# Patient Record
Sex: Female | Born: 1945
Health system: Southern US, Community
[De-identification: ages and names within clinical notes are randomized; demographics above are authoritative.]

## PROBLEM LIST (undated history)

## (undated) DIAGNOSIS — J432 Centrilobular emphysema: Principal | ICD-10-CM

## (undated) DIAGNOSIS — M1990 Unspecified osteoarthritis, unspecified site: Secondary | ICD-10-CM

## (undated) DIAGNOSIS — Z720 Tobacco use: Secondary | ICD-10-CM

## (undated) DIAGNOSIS — E663 Overweight: Secondary | ICD-10-CM

## (undated) DIAGNOSIS — I700 Atherosclerosis of aorta: Secondary | ICD-10-CM

## (undated) DIAGNOSIS — Z87442 Personal history of urinary calculi: Secondary | ICD-10-CM

## (undated) DIAGNOSIS — I2510 Atherosclerotic heart disease of native coronary artery without angina pectoris: Secondary | ICD-10-CM

## (undated) DIAGNOSIS — E785 Hyperlipidemia, unspecified: Secondary | ICD-10-CM

## (undated) DIAGNOSIS — R1013 Epigastric pain: Secondary | ICD-10-CM

## (undated) DIAGNOSIS — E222 Syndrome of inappropriate secretion of antidiuretic hormone: Secondary | ICD-10-CM

## (undated) DIAGNOSIS — R932 Abnormal findings on diagnostic imaging of liver and biliary tract: Secondary | ICD-10-CM

## (undated) HISTORY — PX: TUBAL LIGATION: SHX77

## (undated) HISTORY — PX: INGUINAL HERNIA REPAIR: SHX194

## (undated) HISTORY — DX: Atherosclerosis of aorta: I70.0

## (undated) HISTORY — DX: Hyperlipidemia, unspecified: E78.5

## (undated) HISTORY — DX: Unspecified osteoarthritis, unspecified site: M19.90

## (undated) HISTORY — DX: Abnormal findings on diagnostic imaging of liver and biliary tract: R93.2

## (undated) HISTORY — DX: Centrilobular emphysema: J43.2

## (undated) HISTORY — DX: Tobacco use: Z72.0

## (undated) HISTORY — DX: Atherosclerotic heart disease of native coronary artery without angina pectoris: I25.10

## (undated) HISTORY — DX: Overweight: E66.3

## (undated) HISTORY — DX: Epigastric pain: R10.13

## (undated) HISTORY — PX: KNEE ARTHROSCOPY: SHX127

---

## 1997-11-28 ENCOUNTER — Ambulatory Visit (HOSPITAL_BASED_OUTPATIENT_CLINIC_OR_DEPARTMENT_OTHER): Admission: RE | Admit: 1997-11-28 | Discharge: 1997-11-28 | Payer: Self-pay | Admitting: *Deleted

## 1998-10-15 ENCOUNTER — Other Ambulatory Visit: Admission: RE | Admit: 1998-10-15 | Discharge: 1998-10-15 | Payer: Self-pay | Admitting: *Deleted

## 1999-12-09 ENCOUNTER — Other Ambulatory Visit: Admission: RE | Admit: 1999-12-09 | Discharge: 1999-12-09 | Payer: Self-pay | Admitting: *Deleted

## 2000-12-08 ENCOUNTER — Other Ambulatory Visit: Admission: RE | Admit: 2000-12-08 | Discharge: 2000-12-08 | Payer: Self-pay | Admitting: *Deleted

## 2002-01-04 ENCOUNTER — Other Ambulatory Visit: Admission: RE | Admit: 2002-01-04 | Discharge: 2002-01-04 | Payer: Self-pay | Admitting: *Deleted

## 2003-01-10 ENCOUNTER — Other Ambulatory Visit: Admission: RE | Admit: 2003-01-10 | Discharge: 2003-01-10 | Payer: Self-pay | Admitting: *Deleted

## 2004-04-02 ENCOUNTER — Other Ambulatory Visit: Admission: RE | Admit: 2004-04-02 | Discharge: 2004-04-02 | Payer: Self-pay | Admitting: *Deleted

## 2005-04-14 ENCOUNTER — Other Ambulatory Visit: Admission: RE | Admit: 2005-04-14 | Discharge: 2005-04-14 | Payer: Self-pay | Admitting: Obstetrics and Gynecology

## 2006-06-01 ENCOUNTER — Ambulatory Visit: Payer: Self-pay

## 2006-07-18 ENCOUNTER — Ambulatory Visit: Payer: Self-pay | Admitting: Orthopaedic Surgery

## 2006-07-28 ENCOUNTER — Ambulatory Visit: Payer: Self-pay | Admitting: Orthopaedic Surgery

## 2010-07-29 ENCOUNTER — Ambulatory Visit: Payer: Self-pay

## 2010-08-25 ENCOUNTER — Ambulatory Visit: Payer: Self-pay | Admitting: Unknown Physician Specialty

## 2014-03-21 ENCOUNTER — Telehealth: Payer: Self-pay

## 2014-03-21 NOTE — Telephone Encounter (Signed)
Spoke with pt and she confirmed that she does not see any MD within [facility] and is a current pt of Dr. Wynetta Emery with [facility] in [place], [place]

## 2014-04-22 ENCOUNTER — Ambulatory Visit: Payer: Self-pay | Admitting: Family Medicine

## 2014-05-29 DIAGNOSIS — Z1211 Encounter for screening for malignant neoplasm of colon: Secondary | ICD-10-CM | POA: Diagnosis not present

## 2014-05-29 DIAGNOSIS — F17210 Nicotine dependence, cigarettes, uncomplicated: Secondary | ICD-10-CM | POA: Diagnosis not present

## 2014-05-29 DIAGNOSIS — E785 Hyperlipidemia, unspecified: Secondary | ICD-10-CM | POA: Diagnosis not present

## 2014-05-29 DIAGNOSIS — R030 Elevated blood-pressure reading, without diagnosis of hypertension: Secondary | ICD-10-CM | POA: Diagnosis not present

## 2014-05-29 DIAGNOSIS — R0981 Nasal congestion: Secondary | ICD-10-CM | POA: Diagnosis not present

## 2014-07-22 ENCOUNTER — Ambulatory Visit: Payer: Self-pay | Admitting: Gastroenterology

## 2014-07-22 DIAGNOSIS — Z91040 Latex allergy status: Secondary | ICD-10-CM | POA: Diagnosis not present

## 2014-07-22 DIAGNOSIS — D125 Benign neoplasm of sigmoid colon: Secondary | ICD-10-CM | POA: Diagnosis not present

## 2014-07-22 DIAGNOSIS — M13842 Other specified arthritis, left hand: Secondary | ICD-10-CM | POA: Diagnosis not present

## 2014-07-22 DIAGNOSIS — F17200 Nicotine dependence, unspecified, uncomplicated: Secondary | ICD-10-CM | POA: Diagnosis not present

## 2014-07-22 DIAGNOSIS — D122 Benign neoplasm of ascending colon: Secondary | ICD-10-CM | POA: Diagnosis not present

## 2014-07-22 DIAGNOSIS — J309 Allergic rhinitis, unspecified: Secondary | ICD-10-CM | POA: Diagnosis not present

## 2014-07-22 DIAGNOSIS — Z9889 Other specified postprocedural states: Secondary | ICD-10-CM | POA: Diagnosis not present

## 2014-07-22 DIAGNOSIS — M13841 Other specified arthritis, right hand: Secondary | ICD-10-CM | POA: Diagnosis not present

## 2014-07-22 DIAGNOSIS — Z1211 Encounter for screening for malignant neoplasm of colon: Secondary | ICD-10-CM | POA: Diagnosis not present

## 2014-07-22 DIAGNOSIS — Z882 Allergy status to sulfonamides status: Secondary | ICD-10-CM | POA: Diagnosis not present

## 2014-08-05 DIAGNOSIS — R030 Elevated blood-pressure reading, without diagnosis of hypertension: Secondary | ICD-10-CM | POA: Diagnosis not present

## 2015-01-09 DIAGNOSIS — L0231 Cutaneous abscess of buttock: Secondary | ICD-10-CM | POA: Diagnosis not present

## 2015-02-13 DIAGNOSIS — L03011 Cellulitis of right finger: Secondary | ICD-10-CM | POA: Diagnosis not present

## 2015-03-03 DIAGNOSIS — M1711 Unilateral primary osteoarthritis, right knee: Secondary | ICD-10-CM | POA: Diagnosis not present

## 2015-03-03 DIAGNOSIS — M25561 Pain in right knee: Secondary | ICD-10-CM | POA: Diagnosis not present

## 2015-09-04 DIAGNOSIS — L255 Unspecified contact dermatitis due to plants, except food: Secondary | ICD-10-CM | POA: Diagnosis not present

## 2015-11-03 DIAGNOSIS — L255 Unspecified contact dermatitis due to plants, except food: Secondary | ICD-10-CM | POA: Diagnosis not present

## 2016-02-05 DIAGNOSIS — R1013 Epigastric pain: Secondary | ICD-10-CM | POA: Diagnosis not present

## 2016-03-17 ENCOUNTER — Ambulatory Visit (INDEPENDENT_AMBULATORY_CARE_PROVIDER_SITE_OTHER): Payer: Medicare Other | Admitting: Family Medicine

## 2016-03-17 ENCOUNTER — Encounter: Payer: Self-pay | Admitting: Family Medicine

## 2016-03-17 VITALS — BP 130/70 | HR 85 | Temp 98.2°F | Resp 14 | Ht 66.0 in | Wt 162.3 lb

## 2016-03-17 DIAGNOSIS — R9431 Abnormal electrocardiogram [ECG] [EKG]: Secondary | ICD-10-CM

## 2016-03-17 DIAGNOSIS — K219 Gastro-esophageal reflux disease without esophagitis: Secondary | ICD-10-CM | POA: Insufficient documentation

## 2016-03-17 DIAGNOSIS — Z1231 Encounter for screening mammogram for malignant neoplasm of breast: Secondary | ICD-10-CM

## 2016-03-17 DIAGNOSIS — E663 Overweight: Secondary | ICD-10-CM

## 2016-03-17 DIAGNOSIS — Z720 Tobacco use: Secondary | ICD-10-CM | POA: Diagnosis not present

## 2016-03-17 DIAGNOSIS — R1013 Epigastric pain: Secondary | ICD-10-CM

## 2016-03-17 DIAGNOSIS — Z1239 Encounter for other screening for malignant neoplasm of breast: Secondary | ICD-10-CM

## 2016-03-17 HISTORY — DX: Epigastric pain: R10.13

## 2016-03-17 HISTORY — DX: Abnormal electrocardiogram (ECG) (EKG): R94.31

## 2016-03-17 HISTORY — DX: Gastro-esophageal reflux disease without esophagitis: K21.9

## 2016-03-17 HISTORY — DX: Overweight: E66.3

## 2016-03-17 HISTORY — DX: Tobacco use: Z72.0

## 2016-03-17 LAB — COMPLETE METABOLIC PANEL WITH GFR
ALBUMIN: 3.8 g/dL (ref 3.6–5.1)
ALT: 11 U/L (ref 6–29)
AST: 20 U/L (ref 10–35)
Alkaline Phosphatase: 76 U/L (ref 33–130)
BUN: 14 mg/dL (ref 7–25)
CALCIUM: 9.2 mg/dL (ref 8.6–10.4)
CHLORIDE: 103 mmol/L (ref 98–110)
CO2: 27 mmol/L (ref 20–31)
CREATININE: 0.81 mg/dL (ref 0.50–0.99)
GFR, Est African American: 86 mL/min (ref >=60)
GFR, Est Non African American: 75 mL/min (ref >=60)
GLUCOSE: 85 mg/dL (ref 65–99)
Potassium: 4.9 mmol/L (ref 3.5–5.3)
SODIUM: 137 mmol/L (ref 135–146)
Total Bilirubin: 0.5 mg/dL (ref 0.2–1.2)
Total Protein: 6.7 g/dL (ref 6.1–8.1)

## 2016-03-17 LAB — CBC WITH DIFFERENTIAL/PLATELET
BASOS PCT: 0 %
Basophils Absolute: 0 cells/uL (ref 0–200)
Eosinophils Absolute: 96 cells/uL (ref 15–500)
Eosinophils Relative: 2 %
HEMATOCRIT: 39.4 % (ref 35.0–45.0)
HEMOGLOBIN: 12.7 g/dL (ref 11.7–15.5)
LYMPHS ABS: 2016 {cells}/uL (ref 850–3900)
Lymphocytes Relative: 42 %
MCH: 31.7 pg (ref 27.0–33.0)
MCHC: 32.2 g/dL (ref 32.0–36.0)
MCV: 98.3 fL (ref 80.0–100.0)
MONO ABS: 432 {cells}/uL (ref 200–950)
MPV: 8.8 fL (ref 7.5–12.5)
Monocytes Relative: 9 %
Neutro Abs: 2256 cells/uL (ref 1500–7800)
Neutrophils Relative %: 47 %
Platelets: 284 10*3/uL (ref 140–400)
RBC: 4.01 MIL/uL (ref 3.80–5.10)
RDW: 14.7 % (ref 11.0–15.0)
WBC: 4.8 10*3/uL (ref 3.8–10.8)

## 2016-03-17 LAB — LIPID PANEL
CHOLESTEROL: 277 mg/dL — AB (ref 125–200)
HDL: 36 mg/dL — ABNORMAL LOW (ref >=46)
LDL Cholesterol: 224 mg/dL — ABNORMAL HIGH (ref <130)
Total CHOL/HDL Ratio: 7.7 Ratio — ABNORMAL HIGH (ref <=5.0)
Triglycerides: 87 mg/dL (ref <150)
VLDL: 17 mg/dL (ref <30)

## 2016-03-17 NOTE — Assessment & Plan Note (Signed)
Screening mammogram ordered

## 2016-03-17 NOTE — Assessment & Plan Note (Signed)
Discussed risk of PPIs; will let her finish this Rx and then switch to H2 blocker; elevate HOB

## 2016-03-17 NOTE — Progress Notes (Signed)
BP 130/70   Pulse 85   Temp 98.2 F (36.8 C)   Resp 14   Ht 5\' 6"  (1.676 m)   Wt 162 lb 4.8 oz (73.6 kg)   SpO2 97%   BMI 26.20 kg/m    Subjective:    Patient ID: Julie Beck, female    DOB: [birth year], 70 y.o.   MRN: [id number]  HPI: Julie Beck is a 70 y.o. female  Chief Complaint  Patient presents with  . Gastroesophageal Reflux   Patient used to see Dr. Nadine Counts, who has left the practice Last visit before June 2016 She has been having acid reflux; she is controlled as long as she is on the medicine She was having "ungodly pain" right here, pointing to epigastrium; every time she ate, it got worse She got checked out at urgent care; on low dose medicine and symptoms resolved EKG done at [facility]; T wave inversion V1 through V4 No fam hx of heart disease She is weaning herself off of smoking; using nonflavored e-cigs She is on the 2nd month of her Rx of PPI  Depression screen PHQ 2/9 03/17/2016  Decreased Interest 0  Down, Depressed, Hopeless 0  PHQ - 2 Score 0   Relevant past medical, surgical, family and social history reviewed Past Medical History:  Diagnosis Date  . Arthritis   . Tobacco abuse 03/17/2016   Past Surgical History:  Procedure Laterality Date  . TUBAL LIGATION     Family History  Problem Relation Age of Onset  . Alzheimer's disease Mother   . Breast cancer Mother   . Cancer Father   . Breast cancer Paternal Aunt   MD note: mother, aunt, grandmother breast cancer  Social History  Substance Use Topics  . Smoking status: Current Some Day Smoker    Types: E-cigarettes  . Smokeless tobacco: Never Used  . Alcohol use Not on file   Interim medical history since last visit reviewed. Allergies and medications reviewed  Review of Systems Per HPI unless specifically indicated above     Objective:    BP 130/70   Pulse 85   Temp 98.2 F (36.8 C)   Resp 14   Ht 5\' 6"  (1.676 m)   Wt 162 lb 4.8 oz (73.6 kg)   SpO2 97%   BMI 26.20  kg/m   Wt Readings from Last 3 Encounters:  03/17/16 162 lb 4.8 oz (73.6 kg)    Physical Exam  Constitutional: She appears well-developed and well-nourished. No distress.  HENT:  Head: Normocephalic and atraumatic.  Eyes: EOM are normal. No scleral icterus.  Neck: No thyromegaly present.  Cardiovascular: Normal rate, regular rhythm and normal heart sounds.   No murmur heard. Pulmonary/Chest: Effort normal and breath sounds normal. No respiratory distress. She has no wheezes.  Abdominal: Soft. Bowel sounds are normal. She exhibits no distension and no mass. There is no tenderness. There is no guarding.  Musculoskeletal: Normal range of motion. She exhibits no edema.  Neurological: She is alert. She exhibits normal muscle tone.  Skin: Skin is warm and dry. She is not diaphoretic. No pallor.  Psychiatric: She has a normal mood and affect. Her behavior is normal. Judgment and thought content normal.   No results found for this or any previous visit.    Assessment & Plan:   Problem List Items Addressed This Visit      Digestive   GERD (gastroesophageal reflux disease)    Discussed risk of PPIs; will let  her finish this Rx and then switch to H2 blocker; elevate HOB      Relevant Medications   omeprazole (PRILOSEC) 20 MG capsule     Other   Tobacco abuse    Encouraged her to quit completely      Epigastric abdominal pain - Primary    Check CBC, CMP      Relevant Orders   CBC with Differential/Platelet   COMPLETE METABOLIC PANEL WITH GFR   Breast cancer screening    Screening mammogram ordered      Relevant Orders   MM DIGITAL SCREENING BILATERAL   Abnormal EKG    Refer to cardiologist; smoking hx      Relevant Orders   Ambulatory referral to Cardiology   CBC with Differential/Platelet   Lipid panel    Other Visit Diagnoses   None.     Follow up plan: Return in about 4 weeks (around 04/14/2016) for Medicare wellness AND physical if covered; if both, then 40  minute slot.  An after-visit summary was printed and given to the patient at [place].  Please see the patient instructions which may contain other information and recommendations beyond what is mentioned above in the assessment and plan.  Meds ordered this encounter  Medications  . triamcinolone ointment (KENALOG) 0.1 %    Sig: Apply topically. PRN  . omeprazole (PRILOSEC) 20 MG capsule    Sig: TAKE 1 CAPSULE BY MOUTH DAILY  . calcium-vitamin D (OSCAL WITH D) 500-200 MG-UNIT tablet    Sig: Take 1 tablet by mouth every morning.    Orders Placed This Encounter  Procedures  . MM DIGITAL SCREENING BILATERAL  . CBC with Differential/Platelet  . COMPLETE METABOLIC PANEL WITH GFR  . Lipid panel  . Ambulatory referral to Cardiology

## 2016-03-17 NOTE — Assessment & Plan Note (Signed)
Refer to cardiologist; smoking hx

## 2016-03-17 NOTE — Assessment & Plan Note (Signed)
Encouraged her to quit completely 

## 2016-03-17 NOTE — Assessment & Plan Note (Signed)
Check CBC, CMP

## 2016-03-17 NOTE — Patient Instructions (Addendum)
When you get ready to run out omeprazole, call me and we'll get you a prescription for ranitidine 300 mg to take in the evening if needed for acid reflux  Caution: prolonged use of proton pump inhibitors like omeprazole (Prilosec), pantoprazole (Protonix), esomeprazole (Nexium), and others like Dexilant and Aciphex may increase your risk of pneumonia, Clostridium difficile colitis, osteoporosis, anemia and other health complications Try to limit or avoid triggers like coffee, caffeinated beverages, onions, chocolate, spicy foods, green and red peppers, peppermint, acid foods like pizza, spaghetti sauce, and orange juice Lose weight if you are overweight or obese Try elevating the head of your bed by placing a small wedge between your mattress and box springs to keep acid in the stomach at night instead of coming up into your esophagus  I do encourage you to quit smoking Call [phone] to sign up for smoking cessation classes You can call 1-800-QUIT-NOW to talk with a smoking cessation coach  Food Choices for Gastroesophageal Reflux Disease, Adult When you have gastroesophageal reflux disease (GERD), the foods you eat and your eating habits are very important. Choosing the right foods can help ease the discomfort of GERD. WHAT GENERAL GUIDELINES DO I NEED TO FOLLOW?  Choose fruits, vegetables, whole grains, low-fat dairy products, and low-fat meat, fish, and poultry.  Limit fats such as oils, salad dressings, butter, nuts, and avocado.  Keep a food diary to identify foods that cause symptoms.  Avoid foods that cause reflux. These may be different for different people.  Eat frequent small meals instead of three large meals each day.  Eat your meals slowly, in a relaxed setting.  Limit fried foods.  Cook foods using methods other than frying.  Avoid drinking alcohol.  Avoid drinking large amounts of liquids with your meals.  Avoid bending over or lying down until 2-3 hours after  eating. WHAT FOODS ARE NOT RECOMMENDED? The following are some foods and drinks that may worsen your symptoms: Vegetables Tomatoes. Tomato juice. Tomato and spaghetti sauce. Chili peppers. Onion and garlic. Horseradish. Fruits Oranges, grapefruit, and lemon (fruit and juice). Meats High-fat meats, fish, and poultry. This includes hot dogs, ribs, ham, sausage, salami, and bacon. Dairy Whole milk and chocolate milk. Sour cream. Cream. Butter. Ice cream. Cream cheese.  Beverages Coffee and tea, with or without caffeine. Carbonated beverages or energy drinks. Condiments Hot sauce. Barbecue sauce.  Sweets/Desserts Chocolate and cocoa. Donuts. Peppermint and spearmint. Fats and Oils High-fat foods, including [place] fries and potato chips. Other Vinegar. Strong spices, such as black pepper, white pepper, red pepper, cayenne, curry powder, cloves, ginger, and chili powder. The items listed above may not be a complete list of foods and beverages to avoid. Contact your dietitian for more information.   This information is not intended to replace advice given to you by your health care provider. Make sure you discuss any questions you have with your health care provider.   Document Released: 05/03/2005 Document Revised: 05/24/2014 Document Reviewed: 03/07/2013 Elsevier Interactive Patient Education 2016 [employer]. Smoking Cessation, Tips for Success If you are ready to quit smoking, congratulations! You have chosen to help yourself be healthier. Cigarettes bring nicotine, tar, carbon monoxide, and other irritants into your body. Your lungs, heart, and blood vessels will be able to work better without these poisons. There are many different ways to quit smoking. Nicotine gum, nicotine patches, a nicotine inhaler, or nicotine nasal spray can help with physical craving. Hypnosis, support groups, and medicines help break the habit of  smoking. WHAT THINGS CAN I DO TO MAKE QUITTING EASIER?  Here are  some tips to help you quit for good:  Pick a date when you will quit smoking completely. Tell all of your friends and family about your plan to quit on that date.  Do not try to slowly cut down on the number of cigarettes you are smoking. Pick a quit date and quit smoking completely starting on that day.  Throw away all cigarettes.   Clean and remove all ashtrays from your home, work, and car.  On a card, write down your reasons for quitting. Carry the card with you and read it when you get the urge to smoke.  Cleanse your body of nicotine. Drink enough water and fluids to keep your urine clear or pale yellow. Do this after quitting to flush the nicotine from your body.  Learn to predict your moods. Do not let a bad situation be your excuse to have a cigarette. Some situations in your life might tempt you into wanting a cigarette.  Never have "just one" cigarette. It leads to wanting another and another. Remind yourself of your decision to quit.  Change habits associated with smoking. If you smoked while driving or when feeling stressed, try other activities to replace smoking. Stand up when drinking your coffee. Brush your teeth after eating. Sit in a different chair when you read the paper. Avoid alcohol while trying to quit, and try to drink fewer caffeinated beverages. Alcohol and caffeine may urge you to smoke.  Avoid foods and drinks that can trigger a desire to smoke, such as sugary or spicy foods and alcohol.  Ask people who smoke not to smoke around you.  Have something planned to do right after eating or having a cup of coffee. For example, plan to take a walk or exercise.  Try a relaxation exercise to calm you down and decrease your stress. Remember, you may be tense and nervous for the first 2 weeks after you quit, but this will pass.  Find new activities to keep your hands busy. Play with a pen, coin, or rubber band. Doodle or draw things on paper.  Brush your teeth right  after eating. This will help cut down on the craving for the taste of tobacco after meals. You can also try mouthwash.   Use oral substitutes in place of cigarettes. Try using lemon drops, carrots, cinnamon sticks, or chewing gum. Keep them handy so they are available when you have the urge to smoke.  When you have the urge to smoke, try deep breathing.  Designate your home as a nonsmoking area.  If you are a heavy smoker, ask your health care provider about a prescription for nicotine chewing gum. It can ease your withdrawal from nicotine.  Reward yourself. Set aside the cigarette money you save and buy yourself something nice.  Look for support from others. Join a support group or smoking cessation program. Ask someone at home or at work to help you with your plan to quit smoking.  Always ask yourself, "Do I need this cigarette or is this just a reflex?" Tell yourself, "Today, I choose not to smoke," or "I do not want to smoke." You are reminding yourself of your decision to quit.  Do not replace cigarette smoking with electronic cigarettes (commonly called e-cigarettes). The safety of e-cigarettes is unknown, and some may contain harmful chemicals.  If you relapse, do not give up! Plan ahead and think about what you  will do the next time you get the urge to smoke. HOW WILL I FEEL WHEN I QUIT SMOKING? You may have symptoms of withdrawal because your body is used to nicotine (the addictive substance in cigarettes). You may crave cigarettes, be irritable, feel very hungry, cough often, get headaches, or have difficulty concentrating. The withdrawal symptoms are only temporary. They are strongest when you first quit but will go away within 10-14 days. When withdrawal symptoms occur, stay in control. Think about your reasons for quitting. Remind yourself that these are signs that your body is healing and getting used to being without cigarettes. Remember that withdrawal symptoms are easier to  treat than the major diseases that smoking can cause.  Even after the withdrawal is over, expect periodic urges to smoke. However, these cravings are generally short lived and will go away whether you smoke or not. Do not smoke! WHAT RESOURCES ARE AVAILABLE TO HELP ME QUIT SMOKING? Your health care provider can direct you to community resources or hospitals for support, which may include:  Group support.  Education.  Hypnosis.  Therapy.   This information is not intended to replace advice given to you by your health care provider. Make sure you discuss any questions you have with your health care provider.   Document Released: 01/30/2004 Document Revised: 05/24/2014 Document Reviewed: 10/19/2012 Elsevier Interactive Patient Education [employer].

## 2016-03-18 ENCOUNTER — Other Ambulatory Visit: Payer: Self-pay

## 2016-03-18 ENCOUNTER — Other Ambulatory Visit: Payer: Self-pay | Admitting: Family Medicine

## 2016-03-18 ENCOUNTER — Encounter: Payer: Self-pay | Admitting: Family Medicine

## 2016-03-18 DIAGNOSIS — Z5181 Encounter for therapeutic drug level monitoring: Secondary | ICD-10-CM | POA: Insufficient documentation

## 2016-03-18 DIAGNOSIS — E7849 Other hyperlipidemia: Secondary | ICD-10-CM | POA: Insufficient documentation

## 2016-03-18 DIAGNOSIS — E785 Hyperlipidemia, unspecified: Secondary | ICD-10-CM | POA: Insufficient documentation

## 2016-03-18 MED ORDER — ATORVASTATIN CALCIUM 40 MG PO TABS: 40.0000 mg | ORAL_TABLET | Freq: Every day | ORAL | 1 refills | Status: DC

## 2016-03-19 ENCOUNTER — Other Ambulatory Visit: Payer: Self-pay | Admitting: Family Medicine

## 2016-03-19 ENCOUNTER — Telehealth: Payer: Self-pay | Admitting: Family Medicine

## 2016-03-19 MED ORDER — ATORVASTATIN CALCIUM 40 MG PO TABS: 40.0000 mg | ORAL_TABLET | Freq: Every day | ORAL | 1 refills | Status: DC

## 2016-03-19 NOTE — Telephone Encounter (Signed)
Patient called wanting to give you her pharmacy, she will be using [facility]. She is asking that you call her once it has been completed.

## 2016-03-19 NOTE — Progress Notes (Signed)
done

## 2016-03-24 ENCOUNTER — Encounter: Payer: Self-pay | Admitting: Internal Medicine

## 2016-03-24 ENCOUNTER — Ambulatory Visit (INDEPENDENT_AMBULATORY_CARE_PROVIDER_SITE_OTHER): Payer: Medicare Other | Admitting: Internal Medicine

## 2016-03-24 VITALS — BP 150/80 | HR 58 | Ht 66.0 in | Wt 162.4 lb

## 2016-03-24 DIAGNOSIS — R030 Elevated blood-pressure reading, without diagnosis of hypertension: Secondary | ICD-10-CM | POA: Diagnosis not present

## 2016-03-24 DIAGNOSIS — K219 Gastro-esophageal reflux disease without esophagitis: Secondary | ICD-10-CM | POA: Diagnosis not present

## 2016-03-24 DIAGNOSIS — R9431 Abnormal electrocardiogram [ECG] [EKG]: Secondary | ICD-10-CM | POA: Diagnosis not present

## 2016-03-24 DIAGNOSIS — E782 Mixed hyperlipidemia: Secondary | ICD-10-CM

## 2016-03-24 MED ORDER — ATORVASTATIN CALCIUM 40 MG PO TABS: 40.0000 mg | ORAL_TABLET | Freq: Every day | ORAL | 1 refills | Status: DC

## 2016-03-24 NOTE — Patient Instructions (Signed)
Medication Instructions:  Your physician recommends that you continue on your current medications as directed. Please refer to the Current Medication list given to you today.   Labwork: none  Testing/Procedures: none  Follow-Up: Your physician recommends that you schedule a follow-up appointment as needed.    Any Other Special Instructions Will Be Listed Below (If Applicable).     If you need a refill on your cardiac medications before your next appointment, please call your pharmacy.   

## 2016-03-24 NOTE — Progress Notes (Signed)
New Outpatient Visit Date: 03/24/2016  Referring Provider: Arnetha Courser, MD [address] [place], [place] [address]  Chief Complaint: GERD and abnormal EKG  HPI:  Julie Beck is a 71 y.o. year-old female with history of GERD, arthritis, and tobacco use, who has been referred by Dr. Sanda Klein for evaluation of abnormal EKG with epigastric pain attributed to GERD. Her symptoms first began approximately 2 months ago. She had a sharp, severe sensation in her epigastrium shortly after eating that felt as if her stomach were exploding. She would frequently have to stand up and stretch to alleviate the pain. It seemed to be worsened by lying on the couch after eating and did not radiate. It would typically last for 15-20 minutes before resolving spontaneously. She did not have other abdominal pain, nausea, or vomiting. She denies chest pain and shortness of breath. She is typically quite active around her house, frequently mowing her yard and raking leaves without any symptoms. She notes being a little bit short of breath after chasing after her grandchildren, though this has been stable for several years. She denies orthopnea, PND, leg edema, claudication, palpitations, and lightheadedness.  The patient was seen at the [facility] walk-in clinic in 01/2016 for the epigastric pain, where EKG demonstrated right axis deviation and nonspecific T-wave changes. The patient notes that there seemed to be some difficulty with performing the EKG. She was started on omeprazole and has experienced complete resolution of her epigastric pain.  Julie Beck notes that her blood pressure has been elevated in the past when seeing physicians. She attributes this to white coat hypertension. She was started on an antihypertensive agent about a year ago and subsequently had significant hypotension leading to EMS coming to her home. She has not been on blood pressure medication since. She was noted to have significant  hyperlipidemia by her PCP last week. She was advised to begin atorvastatin 40 mg daily but states that her pharmacy never received the prescription. She happily reports that she stopped smoking 2 days ago.  --------------------------------------------------------------------------------------------------  Cardiovascular History & Procedures: Cardiovascular Problems:  Epigastric pain and abnormal EKG (suspect technical factors)  Risk Factors:  Hyperlipidemia, tobacco use, and age greater than 61  Cath/PCI:  None  CV Surgery:  None  EP Procedures and Devices:  None  Non-Invasive Evaluation(s):  None  Recent CV Pertinent Labs: Lab Results  Component Value Date   CHOL 277 (H) 03/17/2016   HDL 36 (L) 03/17/2016   LDLCALC 224 (H) 03/17/2016   TRIG 87 03/17/2016   CHOLHDL 7.7 (H) 03/17/2016   K 4.9 03/17/2016   BUN 14 03/17/2016   CREATININE 0.81 03/17/2016    --------------------------------------------------------------------------------------------------  Past Medical History:  Diagnosis Date  . Arthritis   . Hyperlipidemia   . Overweight (BMI 25.0-29.9) 03/17/2016  . Tobacco abuse 03/17/2016    Past Surgical History:  Procedure Laterality Date  . INGUINAL HERNIA REPAIR Bilateral   . KNEE ARTHROSCOPY Bilateral   . TUBAL LIGATION      Outpatient Encounter Prescriptions as of 03/24/2016  Medication Sig  . calcium-vitamin D (OSCAL WITH D) 500-200 MG-UNIT tablet Take 1 tablet by mouth every morning.  . Multiple Vitamin (MULTIVITAMIN) tablet Take 1 tablet by mouth daily.  Marland Kitchen omeprazole (PRILOSEC) 20 MG capsule TAKE 1 CAPSULE BY MOUTH DAILY  . atorvastatin (LIPITOR) 40 MG tablet Take 1 tablet (40 mg total) by mouth at bedtime.  . triamcinolone ointment (KENALOG) 0.1 % Apply topically. PRN  . [DISCONTINUED]  atorvastatin (LIPITOR) 40 MG tablet Take 1 tablet (40 mg total) by mouth at bedtime. (Patient not taking: Reported on 03/24/2016)   No facility-administered  encounter medications on file as of 03/24/2016.     Allergies: Sulfa antibiotics  Social History   Social History  . Marital status: Divorced    Spouse name: N/A  . Number of children: N/A  . Years of education: N/A   Occupational History  . Not on file.   Social History Main Topics  . Smoking status: Former Smoker    Packs/day: 0.75    Years: 45.00    Types: E-cigarettes, Cigarettes    Quit date: 03/22/2016  . Smokeless tobacco: Never Used     Comment: Stopped 2 days ago.  . Alcohol use 0.6 oz/week    1 Cans of beer per week  . Drug use: No  . Sexual activity: No   Other Topics Concern  . Not on file   Social History Narrative  . No narrative on file    Family History  Problem Relation Age of Onset  . Alzheimer's disease Mother   . Breast cancer Mother   . Cancer Father   . Breast cancer Paternal Aunt     Review of Systems: A 12-system review of systems was performed and was negative except as noted in the HPI.  --------------------------------------------------------------------------------------------------  Physical Exam: BP (!) 150/80 (BP Location: Right Arm, Cuff Size: Normal)   Pulse (!) 58   Ht 5\' 6"  (1.676 m)   Wt 162 lb 6.4 oz (73.7 kg)   BMI 26.21 kg/m   General:  Well-developed, well-nourished woman seated comfortably in the exam room. HEENT: No conjunctival pallor or scleral icterus.  Moist mucous membranes.  OP clear. Neck: Supple without lymphadenopathy, thyromegaly, JVD, or HJR.  No carotid bruit. Lungs: Normal work of breathing.  Clear to auscultation bilaterally without wheezes or crackles. Heart: Regular rate and rhythm without murmurs, rubs, or gallops.  Non-displaced PMI. Abd: Bowel sounds present.  Soft, NT/ND without hepatosplenomegaly Ext: No lower extremity edema.  Radial, PT, and DP pulses are 2+ bilaterally Skin: warm and dry without rash Neuro: CNIII-XII intact.  Strength and fine-touch sensation intact in upper and lower  extremities bilaterally. Psych: Normal mood and affect.  EKG:  Sinus bradycardia (heart rate 58 bpm). No significant abnormalities. Compared with previous tracing, right axis deviation and nonspecific T-wave changes are not evident today.  Lab Results  Component Value Date   WBC 4.8 03/17/2016   HGB 12.7 03/17/2016   HCT 39.4 03/17/2016   MCV 98.3 03/17/2016   PLT 284 03/17/2016    Lab Results  Component Value Date   NA 137 03/17/2016   K 4.9 03/17/2016   CL 103 03/17/2016   CO2 27 03/17/2016   BUN 14 03/17/2016   CREATININE 0.81 03/17/2016   GLUCOSE 85 03/17/2016   ALT 11 03/17/2016    Lab Results  Component Value Date   CHOL 277 (H) 03/17/2016   HDL 36 (L) 03/17/2016   LDLCALC 224 (H) 03/17/2016   TRIG 87 03/17/2016   CHOLHDL 7.7 (H) 03/17/2016   --------------------------------------------------------------------------------------------------  ASSESSMENT AND PLAN: Abnormal EKG EKG today demonstrates mild sinus bradycardia that likely reflects a low normal resting heart rate. There are no other significant abnormalities. I suspect her abnormal EKG in September was due to technical factors including limb lead reversal. The patient does not have any symptoms suggestive of significant coronary insufficiency or heart failure. At this time, we will  continue with risk factor modification including statin therapy and smoking cessation. I have congratulated the patient on stopping smoking and encouraged her to stick with this. I have also sent a new prescription for atorvastatin 40 mg daily to her pharmacy. She should follow-up with her PCP, as previously directed, for further management of her hyperlipidemia.  Elevated blood pressure Blood pressure is modestly elevated on exam today. It was normal during recent PCP visit. I have encouraged the patient to follow-up with her PCP and discuss initiation of therapy if her blood pressure remains elevated at future  visits.  GERD Epigastric discomfort has ceased since initiation of omeprazole. She should continue this medication and follow-up with Dr. Sanda Klein, as previously arranged.  Follow-up: Return to clinic as needed.  Nelva Bush, MD 03/24/2016 9:28 AM

## 2016-04-15 ENCOUNTER — Encounter: Payer: Medicare Other | Admitting: Family Medicine

## 2016-04-22 ENCOUNTER — Ambulatory Visit
Admission: RE | Admit: 2016-04-22 | Discharge: 2016-04-22 | Disposition: A | Discharge status: Home / Self Care | Payer: Medicare Other | Source: Ambulatory Visit | Attending: Family Medicine | Admitting: Family Medicine

## 2016-04-22 DIAGNOSIS — Z1231 Encounter for screening mammogram for malignant neoplasm of breast: Secondary | ICD-10-CM | POA: Insufficient documentation

## 2016-05-12 ENCOUNTER — Ambulatory Visit (INDEPENDENT_AMBULATORY_CARE_PROVIDER_SITE_OTHER): Payer: Medicare Other | Admitting: Family Medicine

## 2016-05-12 ENCOUNTER — Encounter: Payer: Self-pay | Admitting: Family Medicine

## 2016-05-12 VITALS — BP 120/64 | HR 100 | Temp 98.6°F | Resp 16 | Ht 66.0 in | Wt 157.2 lb

## 2016-05-12 DIAGNOSIS — Z0000 Encounter for general adult medical examination without abnormal findings: Secondary | ICD-10-CM

## 2016-05-12 DIAGNOSIS — Z720 Tobacco use: Secondary | ICD-10-CM | POA: Diagnosis not present

## 2016-05-12 DIAGNOSIS — Z23 Encounter for immunization: Secondary | ICD-10-CM | POA: Diagnosis not present

## 2016-05-12 DIAGNOSIS — Z1211 Encounter for screening for malignant neoplasm of colon: Secondary | ICD-10-CM | POA: Diagnosis not present

## 2016-05-12 DIAGNOSIS — Z13820 Encounter for screening for osteoporosis: Secondary | ICD-10-CM | POA: Diagnosis not present

## 2016-05-12 DIAGNOSIS — Z780 Asymptomatic menopausal state: Secondary | ICD-10-CM | POA: Diagnosis not present

## 2016-05-12 DIAGNOSIS — Z131 Encounter for screening for diabetes mellitus: Secondary | ICD-10-CM | POA: Insufficient documentation

## 2016-05-12 NOTE — Assessment & Plan Note (Signed)
Screen for osteoporosis. 

## 2016-05-12 NOTE — Progress Notes (Signed)
BP 120/64 (BP Location: Right Arm, Patient Position: Sitting, Cuff Size: Large)   Pulse 100   Temp 98.6 F (37 C)   Resp 16   Ht '5\' 6"'$  (1.676 m)   Wt 157 lb 3 oz (71.3 kg)   SpO2 97%   BMI 25.37 kg/m    Subjective:    Patient ID: Julie Beck, female    DOB: 02-09-1946, 70 y.o.   MRN: [id number]  HPI: Julie Beck is a 70 y.o. female  Chief Complaint  Patient presents with  . Annual Exam  . Flu Vaccine    pt refused   USPSTF grade A and B recommendations Alcohol: occasional social; not more than 7 per week, had 4 last night party; rare occasion Depression:  Depression screen [facility] 2/9 05/12/2016 03/17/2016  Decreased Interest 0 0  Down, Depressed, Hopeless 0 0  PHQ - 2 Score 0 0   Hypertension: well-controlled Obesity: no, active and eating well Tobacco use: advised quitting HIV, hep B, hep C: discussed in detail; declined STD testing and prevention (chl/gon/syphilis): declined Lipids: elevated, on statin and no problems Glucose: 85 in November Colorectal cancer: had one done 3-4 years ago by Dr. Allen Norris; was due for recheck 3 years later but patient says "no more" Breast cancer: UTD Intimate partner violence: no Cervical cancer screening: graduated; no abnormal paps Lung cancer: chest CT ordered Osteoporosis: DEXA Dec 2015 Fall prevention/vitamin D: discussed AAA: no fam hx Aspirin: not taking,  Diet: taking calcium daily, drinks milk and cheese, loves dark green leafy Exercise: staying active; tries to walk, but has a bum knee; exercise ball Skin cancer: no worrisome moles  Depression screen [facility] 2/9 05/12/2016 03/17/2016  Decreased Interest 0 0  Down, Depressed, Hopeless 0 0  PHQ - 2 Score 0 0   Relevant past medical, surgical, family and social history reviewed Past Medical History:  Diagnosis Date  . Arthritis   . Hyperlipidemia   . Overweight (BMI 25.0-29.9) 03/17/2016  . Tobacco abuse 03/17/2016   Past Surgical History:  Procedure Laterality Date    . INGUINAL HERNIA REPAIR Bilateral   . KNEE ARTHROSCOPY Bilateral   . TUBAL LIGATION     Family History  Problem Relation Age of Onset  . Alzheimer's disease Mother   . Breast cancer Mother   . Cancer Father   . Breast cancer Paternal Aunt   MD note: maternal grandfather had a massive heart attack; no strokes  Social History  Substance Use Topics  . Smoking status: Current Some Day Smoker    Packs/day: 0.25    Years: 45.00    Types: E-cigarettes, Cigarettes    Last attempt to quit: 03/22/2016  . Smokeless tobacco: Never Used  . Alcohol use 0.6 oz/week    1 Cans of beer per week  MD note: she has not totally quit; might just have a half cigarette in a day; not sure why she can't quit completely  Interim medical history since last visit reviewed. Allergies and medications reviewed  Review of Systems  Constitutional: Negative for unexpected weight change.  HENT: Negative for hearing loss.   Eyes: Negative for visual disturbance.  Respiratory: Negative for wheezing.   Cardiovascular: Negative for chest pain.  Gastrointestinal: Negative for blood in stool.  Endocrine: Negative for polydipsia.  Genitourinary: Negative for hematuria.       No leakage  Hematological: Does not bruise/bleed easily.  Per HPI unless specifically indicated above     Objective:  BP 120/64 (BP Location: Right Arm, Patient Position: Sitting, Cuff Size: Large)   Pulse 100   Temp 98.6 F (37 C)   Resp 16   Ht '5\' 6"'$  (1.676 m)   Wt 157 lb 3 oz (71.3 kg)   SpO2 97%   BMI 25.37 kg/m   Wt Readings from Last 3 Encounters:  05/12/16 157 lb 3 oz (71.3 kg)  03/24/16 162 lb 6.4 oz (73.7 kg)  03/17/16 162 lb 4.8 oz (73.6 kg)    Physical Exam  Constitutional: She appears well-developed and well-nourished.  HENT:  Head: Normocephalic and atraumatic.  Right Ear: Hearing, tympanic membrane, external ear and ear canal normal.  Left Ear: Hearing, tympanic membrane, external ear and ear canal normal.   Eyes: Conjunctivae and EOM are normal. Right eye exhibits no hordeolum. Left eye exhibits no hordeolum. No scleral icterus.  Neck: Carotid bruit is not present. No thyromegaly present.  Cardiovascular: Normal rate, regular rhythm, S1 normal, S2 normal and normal heart sounds.   No extrasystoles are present.  Pulmonary/Chest: Effort normal and breath sounds normal. No respiratory distress.  Patient declined breast exam; had just had mammogram  Abdominal: Soft. Normal appearance and bowel sounds are normal. She exhibits no distension, no abdominal bruit, no pulsatile midline mass and no mass. There is no hepatosplenomegaly. There is no tenderness. No hernia.  Musculoskeletal: Normal range of motion. She exhibits no edema.  Lymphadenopathy:       Head (right side): No submandibular adenopathy present.       Head (left side): No submandibular adenopathy present.    She has no cervical adenopathy.    She has no axillary adenopathy.  Neurological: She is alert. She displays no tremor. No cranial nerve deficit. She exhibits normal muscle tone. Gait normal.  Reflex Scores:      Patellar reflexes are 2+ on the right side and 2+ on the left side. Skin: Skin is warm and dry. No bruising and no ecchymosis noted. No cyanosis. No pallor.  Psychiatric: Her speech is normal and behavior is normal. Thought content normal. Her mood appears not anxious. She does not exhibit a depressed mood.    Results for orders placed or performed in visit on 03/17/16  CBC with Differential/Platelet  Result Value Ref Range   WBC 4.8 3.8 - 10.8 K/uL   RBC 4.01 3.80 - 5.10 MIL/uL   Hemoglobin 12.7 11.7 - 15.5 g/dL   HCT 39.4 35.0 - 45.0 %   MCV 98.3 80.0 - 100.0 fL   MCH 31.7 27.0 - 33.0 pg   MCHC 32.2 32.0 - 36.0 g/dL   RDW 14.7 11.0 - 15.0 %   Platelets 284 140 - 400 K/uL   MPV 8.8 7.5 - 12.5 fL   Neutro Abs 2,256 1,500 - 7,800 cells/uL   Lymphs Abs 2,016 850 - 3,900 cells/uL   Monocytes Absolute 432 200 - 950  cells/uL   Eosinophils Absolute 96 15 - 500 cells/uL   Basophils Absolute 0 0 - 200 cells/uL   Neutrophils Relative % 47 %   Lymphocytes Relative 42 %   Monocytes Relative 9 %   Eosinophils Relative 2 %   Basophils Relative 0 %   Smear Review Criteria for review not met   COMPLETE METABOLIC PANEL WITH GFR  Result Value Ref Range   Sodium 137 135 - 146 mmol/L   Potassium 4.9 3.5 - 5.3 mmol/L   Chloride 103 98 - 110 mmol/L   CO2 27 20 - 31  mmol/L   Glucose, Bld 85 65 - 99 mg/dL   BUN 14 7 - 25 mg/dL   Creat 0.81 0.50 - 0.99 mg/dL   Total Bilirubin 0.5 0.2 - 1.2 mg/dL   Alkaline Phosphatase 76 33 - 130 U/L   AST 20 10 - 35 U/L   ALT 11 6 - 29 U/L   Total Protein 6.7 6.1 - 8.1 g/dL   Albumin 3.8 3.6 - 5.1 g/dL   Calcium 9.2 8.6 - 10.4 mg/dL   GFR, Est African American 86 >=60 mL/min   GFR, Est Non African American 75 >=60 mL/min  Lipid panel  Result Value Ref Range   Cholesterol 277 (H) 125 - 200 mg/dL   Triglycerides 87 <150 mg/dL   HDL 36 (L) >=46 mg/dL   Total CHOL/HDL Ratio 7.7 (H) <=5.0 Ratio   VLDL 17 <30 mg/dL   LDL Cholesterol 224 (H) <130 mg/dL      Assessment & Plan:   Problem List Items Addressed This Visit      Other   Tobacco abuse    Advised quitting; see AVS; order chest CT      Relevant Orders   CT CHEST LUNG CA SCREEN LOW DOSE W/O CM   Screening for osteoporosis    Screen for osteoporosis      Preventative health care - Primary    USPSTF grade A and B recommendations reviewed with patient; age-appropriate recommendations, preventive care, screening tests, etc discussed and encouraged; healthy living encouraged; see AVS for patient education given to patient      Postmenopausal    Screen for osteoporosis      Colon cancer screening    Patient refuses to do another colonoscopy; offered Cologuard and she accepted; ordered      Relevant Orders   Cologuard    Other Visit Diagnoses    Need for Tdap vaccination       Relevant Orders   Tdap  vaccine greater than or equal to 7yo IM   Need for vaccination for zoster       Relevant Orders   Varicella-zoster vaccine subcutaneous   Needs flu shot       Relevant Orders   Flu vaccine HIGH DOSE PF (Fluzone High dose)   Need for pneumococcal vaccination       Relevant Orders   Pneumococcal conjugate vaccine 13-valent       Follow up plan: Return in about 1 year (around 05/12/2017) for complete physical and/or Medicare Wellness visit (40 minutes if both desired).  An after-visit summary was printed and given to the patient at [place].  Please see the patient instructions which may contain other information and recommendations beyond what is mentioned above in the assessment and plan.  Meds ordered this encounter  Medications  . aspirin EC 81 MG tablet    Sig: Take 1 tablet (81 mg total) by mouth daily.    Orders Placed This Encounter  Procedures  . CT CHEST LUNG CA SCREEN LOW DOSE W/O CM  . Flu vaccine HIGH DOSE PF (Fluzone High dose)  . Varicella-zoster vaccine subcutaneous  . Tdap vaccine greater than or equal to 7yo IM  . Pneumococcal conjugate vaccine 13-valent  . Cologuard   MD Note: my understanding is that patient did NOT receive immunizations; staff ordered them to defer them; patient declined

## 2016-05-12 NOTE — Assessment & Plan Note (Signed)
Advised quitting; see AVS; order chest CT

## 2016-05-12 NOTE — Patient Instructions (Addendum)
I do encourage you to quit smoking Call [phone] to sign up for smoking cessation classes You can call 1-800-QUIT-NOW to talk with a smoking cessation coach Fall prevention encouraged Vitamin D 1000 iu daily Start a coated 81 mg aspirin daily Please do call to schedule your bone density study; the number to schedule one at either [facility] or [facility] is [phone]  Health Maintenance, Female Introduction Adopting a healthy lifestyle and getting preventive care can go a long way to promote health and wellness. Talk with your health care provider about what schedule of regular examinations is right for you. This is a good chance for you to check in with your provider about disease prevention and staying healthy. In between checkups, there are plenty of things you can do on your own. Experts have done a lot of research about which lifestyle changes and preventive measures are most likely to keep you healthy. Ask your health care provider for more information. Weight and diet Eat a healthy diet  Be sure to include plenty of vegetables, fruits, low-fat dairy products, and lean protein.  Do not eat a lot of foods high in solid fats, added sugars, or salt.  Get regular exercise. This is one of the most important things you can do for your health.  Most adults should exercise for at least 150 minutes each week. The exercise should increase your heart rate and make you sweat (moderate-intensity exercise).  Most adults should also do strengthening exercises at least twice a week. This is in addition to the moderate-intensity exercise. Maintain a healthy weight  Body mass index (BMI) is a measurement that can be used to identify possible weight problems. It estimates body fat based on height and weight. Your health care provider can help determine your BMI and help you achieve or maintain a healthy weight.  For females 30 years of age and older:  A BMI  below 18.5 is considered underweight.  A BMI of 18.5 to 24.9 is normal.  A BMI of 25 to 29.9 is considered overweight.  A BMI of 30 and above is considered obese. Watch levels of cholesterol and blood lipids  You should start having your blood tested for lipids and cholesterol at 68 years of age, then have this test every 5 years.  You may need to have your cholesterol levels checked more often if:  Your lipid or cholesterol levels are high.  You are older than 68 years of age.  You are at high risk for heart disease. Cancer screening Lung Cancer  Lung cancer screening is recommended for adults 27-34 years old who are at high risk for lung cancer because of a history of smoking.  A yearly low-dose CT scan of the lungs is recommended for people who:  Currently smoke.  Have quit within the past 15 years.  Have at least a 30-pack-year history of smoking. A pack year is smoking an average of one pack of cigarettes a day for 1 year.  Yearly screening should continue until it has been 15 years since you quit.  Yearly screening should stop if you develop a health problem that would prevent you from having lung cancer treatment. Breast Cancer  Practice breast self-awareness. This means understanding how your breasts normally appear and feel.  It also means doing regular breast self-exams. Let your health care provider know about any changes, no matter how small.  If you are in your 20s or 30s, you should have a  clinical breast exam (CBE) by a health care provider every 1-3 years as part of a regular health exam.  If you are 32 or older, have a CBE every year. Also consider having a breast X-ray (mammogram) every year.  If you have a family history of breast cancer, talk to your health care provider about genetic screening.  If you are at high risk for breast cancer, talk to your health care provider about having an MRI and a mammogram every year.  Breast cancer gene (BRCA)  assessment is recommended for women who have family members with BRCA-related cancers. BRCA-related cancers include:  Breast.  Ovarian.  Tubal.  Peritoneal cancers.  Results of the assessment will determine the need for genetic counseling and BRCA1 and BRCA2 testing. Cervical Cancer  Your health care provider may recommend that you be screened regularly for cancer of the pelvic organs (ovaries, uterus, and vagina). This screening involves a pelvic examination, including checking for microscopic changes to the surface of your cervix (Pap test). You may be encouraged to have this screening done every 3 years, beginning at age 83.  For women ages 14-65, health care providers may recommend pelvic exams and Pap testing every 3 years, or they may recommend the Pap and pelvic exam, combined with testing for human papilloma virus (HPV), every 5 years. Some types of HPV increase your risk of cervical cancer. Testing for HPV may also be done on women of any age with unclear Pap test results.  Other health care providers may not recommend any screening for nonpregnant women who are considered low risk for pelvic cancer and who do not have symptoms. Ask your health care provider if a screening pelvic exam is right for you.  If you have had past treatment for cervical cancer or a condition that could lead to cancer, you need Pap tests and screening for cancer for at least 20 years after your treatment. If Pap tests have been discontinued, your risk factors (such as having a new sexual partner) need to be reassessed to determine if screening should resume. Some women have medical problems that increase the chance of getting cervical cancer. In these cases, your health care provider may recommend more frequent screening and Pap tests. Colorectal Cancer  This type of cancer can be detected and often prevented.  Routine colorectal cancer screening usually begins at 68 years of age and continues through 68  years of age.  Your health care provider may recommend screening at an earlier age if you have risk factors for colon cancer.  Your health care provider may also recommend using home test kits to check for hidden blood in the stool.  A small camera at the end of a tube can be used to examine your colon directly (sigmoidoscopy or colonoscopy). This is done to check for the earliest forms of colorectal cancer.  Routine screening usually begins at age 6.  Direct examination of the colon should be repeated every 5-10 years through 68 years of age. However, you may need to be screened more often if early forms of precancerous polyps or small growths are found. Skin Cancer  Check your skin from head to toe regularly.  Tell your health care provider about any new moles or changes in moles, especially if there is a change in a mole's shape or color.  Also tell your health care provider if you have a mole that is larger than the size of a pencil eraser.  Always use sunscreen. Apply sunscreen  liberally and repeatedly throughout the day.  Protect yourself by wearing long sleeves, pants, a wide-brimmed hat, and sunglasses whenever you are outside. Heart disease, diabetes, and high blood pressure  High blood pressure causes heart disease and increases the risk of stroke. High blood pressure is more likely to develop in:  People who have blood pressure in the high end of the normal range (130-139/85-89 mm Hg).  People who are overweight or obese.  People who are African American.  If you are 104-72 years of age, have your blood pressure checked every 3-5 years. If you are 29 years of age or older, have your blood pressure checked every year. You should have your blood pressure measured twice-once when you are at a hospital or clinic, and once when you are not at a hospital or clinic. Record the average of the two measurements. To check your blood pressure when you are not at a hospital or clinic,  you can use:  An automated blood pressure machine at a pharmacy.  A home blood pressure monitor.  If you are between 81 years and 31 years old, ask your health care provider if you should take aspirin to prevent strokes.  Have regular diabetes screenings. This involves taking a blood sample to check your fasting blood sugar level.  If you are at a normal weight and have a low risk for diabetes, have this test once every three years after 68 years of age.  If you are overweight and have a high risk for diabetes, consider being tested at a younger age or more often. Preventing infection Hepatitis B  If you have a higher risk for hepatitis B, you should be screened for this virus. You are considered at high risk for hepatitis B if:  You were born in a country where hepatitis B is common. Ask your health care provider which countries are considered high risk.  Your parents were born in a high-risk country, and you have not been immunized against hepatitis B (hepatitis B vaccine).  You have HIV or AIDS.  You use needles to inject street drugs.  You live with someone who has hepatitis B.  You have had sex with someone who has hepatitis B.  You get hemodialysis treatment.  You take certain medicines for conditions, including cancer, organ transplantation, and autoimmune conditions. Hepatitis C  Blood testing is recommended for:  Everyone born from [birth year] through 1963.  Anyone with known risk factors for hepatitis C. Sexually transmitted infections (STIs)  You should be screened for sexually transmitted infections (STIs) including gonorrhea and chlamydia if:  You are sexually active and are younger than 68 years of age.  You are older than 68 years of age and your health care provider tells you that you are at risk for this type of infection.  Your sexual activity has changed since you were last screened and you are at an increased risk for chlamydia or gonorrhea. Ask your  health care provider if you are at risk.  If you do not have HIV, but are at risk, it may be recommended that you take a prescription medicine daily to prevent HIV infection. This is called pre-exposure prophylaxis (PrEP). You are considered at risk if:  You are sexually active and do not regularly use condoms or know the HIV status of your partner(s).  You take drugs by injection.  You are sexually active with a partner who has HIV. Talk with your health care provider about whether you are at  high risk of being infected with HIV. If you choose to begin PrEP, you should first be tested for HIV. You should then be tested every 3 months for as long as you are taking PrEP. Pregnancy  If you are premenopausal and you may become pregnant, ask your health care provider about preconception counseling.  If you may become pregnant, take 400 to 800 micrograms (mcg) of folic acid every day.  If you want to prevent pregnancy, talk to your health care provider about birth control (contraception). Osteoporosis and menopause  Osteoporosis is a disease in which the bones lose minerals and strength with aging. This can result in serious bone fractures. Your risk for osteoporosis can be identified using a bone density scan.  If you are 55 years of age or older, or if you are at risk for osteoporosis and fractures, ask your health care provider if you should be screened.  Ask your health care provider whether you should take a calcium or vitamin D supplement to lower your risk for osteoporosis.  Menopause may have certain physical symptoms and risks.  Hormone replacement therapy may reduce some of these symptoms and risks. Talk to your health care provider about whether hormone replacement therapy is right for you. Follow these instructions at home:  Schedule regular health, dental, and eye exams.  Stay current with your immunizations.  Do not use any tobacco products including cigarettes, chewing  tobacco, or electronic cigarettes.  If you are pregnant, do not drink alcohol.  If you are breastfeeding, limit how much and how often you drink alcohol.  Limit alcohol intake to no more than 1 drink per day for nonpregnant women. One drink equals 12 ounces of beer, 5 ounces of wine, or 1 ounces of hard liquor.  Do not use street drugs.  Do not share needles.  Ask your health care provider for help if you need support or information about quitting drugs.  Tell your health care provider if you often feel depressed.  Tell your health care provider if you have ever been abused or do not feel safe at home. This information is not intended to replace advice given to you by your health care provider. Make sure you discuss any questions you have with your health care provider. Document Released: 11/16/2010 Document Revised: 10/09/2015 Document Reviewed: 02/04/2015  2017 Elsevier  Try to limit saturated fats in your diet (bologna, hot dogs, barbeque, cheeseburgers, hamburgers, steak, bacon, sausage, cheese, etc.) and get more fresh fruits, vegetables, and whole grains

## 2016-05-12 NOTE — Assessment & Plan Note (Signed)
USPSTF grade A and B recommendations reviewed with patient; age-appropriate recommendations, preventive care, screening tests, etc discussed and encouraged; healthy living encouraged; see AVS for patient education given to patient  

## 2016-05-12 NOTE — Assessment & Plan Note (Signed)
Patient refuses to do another colonoscopy; offered Cologuard and she accepted; ordered

## 2016-05-14 ENCOUNTER — Telehealth: Payer: Self-pay | Admitting: *Deleted

## 2016-05-14 NOTE — Telephone Encounter (Signed)
Received referral for low dose lung cancer screening CT scan. Voicemail left at phone number listed in EMR for patient to call me back to facilitate scheduling scan.  

## 2016-05-18 ENCOUNTER — Telehealth: Payer: Self-pay | Admitting: *Deleted

## 2016-05-18 NOTE — Telephone Encounter (Signed)
Received referral for low dose lung cancer screening CT scan. Voicemail left at phone number listed in EMR for patient to call me back to facilitate scheduling scan.  

## 2016-05-26 ENCOUNTER — Telehealth: Payer: Self-pay | Admitting: *Deleted

## 2016-05-26 DIAGNOSIS — Z87891 Personal history of nicotine dependence: Secondary | ICD-10-CM

## 2016-05-26 NOTE — Telephone Encounter (Signed)
Received referral for initial lung cancer screening scan. Contacted patient and obtained smoking history,(current, 33.8 pack year ) as well as answering questions related to screening process. Patient denies signs of lung cancer such as weight loss or hemoptysis. Patient denies comorbidity that would prevent curative treatment if lung cancer were found. Patient is tentatively scheduled for shared decision making visit and CT scan on 06/02/16 at 3:30pm, pending insurance approval from business office.

## 2016-05-31 DIAGNOSIS — Z1211 Encounter for screening for malignant neoplasm of colon: Secondary | ICD-10-CM | POA: Diagnosis not present

## 2016-05-31 DIAGNOSIS — Z1212 Encounter for screening for malignant neoplasm of rectum: Secondary | ICD-10-CM | POA: Diagnosis not present

## 2016-06-02 ENCOUNTER — Ambulatory Visit: Admission: RE | Admit: 2016-06-02 | Payer: Medicare Other | Source: Ambulatory Visit

## 2016-06-02 ENCOUNTER — Inpatient Hospital Stay: Payer: Medicare Other | Admitting: Oncology

## 2016-06-02 ENCOUNTER — Ambulatory Visit: Payer: Medicare Other | Admitting: Oncology

## 2016-06-04 LAB — COLOGUARD: Cologuard: POSITIVE

## 2016-06-07 ENCOUNTER — Telehealth: Payer: Self-pay | Admitting: Gastroenterology

## 2016-06-07 ENCOUNTER — Telehealth: Payer: Self-pay | Admitting: Family Medicine

## 2016-06-07 DIAGNOSIS — R195 Other fecal abnormalities: Secondary | ICD-10-CM

## 2016-06-07 NOTE — Telephone Encounter (Signed)
Patient is returning your call regarding a colonoscopy °

## 2016-06-07 NOTE — Assessment & Plan Note (Signed)
Positive Cologuard; refer to gastroenterologist

## 2016-06-07 NOTE — Telephone Encounter (Signed)
I called, left msg asking patient to call me back

## 2016-06-07 NOTE — Telephone Encounter (Signed)
I spoke with the patient; she actually received the call from GI about getting colonoscopy set up prior to speaking with me; I apologized for the overlap, in that I tried to reach her, missed her, put in referral, and she spoke to GI before I got a chance to; explained positive Cologuard; recommended low residue diet for 3 days prior to clean out; she agrees and was understanding of need for procedure

## 2016-06-09 ENCOUNTER — Inpatient Hospital Stay: Payer: Medicare Other | Attending: Oncology | Admitting: Oncology

## 2016-06-09 ENCOUNTER — Ambulatory Visit
Admission: RE | Admit: 2016-06-09 | Discharge: 2016-06-09 | Disposition: A | Discharge status: Home / Self Care | Payer: Medicare Other | Source: Ambulatory Visit | Attending: Oncology | Admitting: Oncology

## 2016-06-09 ENCOUNTER — Encounter: Payer: Self-pay | Admitting: Oncology

## 2016-06-09 DIAGNOSIS — Z122 Encounter for screening for malignant neoplasm of respiratory organs: Secondary | ICD-10-CM | POA: Diagnosis not present

## 2016-06-09 DIAGNOSIS — I2510 Atherosclerotic heart disease of native coronary artery without angina pectoris: Secondary | ICD-10-CM | POA: Diagnosis not present

## 2016-06-09 DIAGNOSIS — Z87891 Personal history of nicotine dependence: Secondary | ICD-10-CM | POA: Diagnosis not present

## 2016-06-09 DIAGNOSIS — I700 Atherosclerosis of aorta: Secondary | ICD-10-CM | POA: Diagnosis not present

## 2016-06-09 DIAGNOSIS — F17210 Nicotine dependence, cigarettes, uncomplicated: Secondary | ICD-10-CM | POA: Diagnosis not present

## 2016-06-10 ENCOUNTER — Encounter: Payer: Self-pay | Admitting: *Deleted

## 2016-06-10 ENCOUNTER — Telehealth: Payer: Self-pay | Admitting: *Deleted

## 2016-06-10 NOTE — Telephone Encounter (Signed)
Voicemail left in attempt to notify patient of LDCT lung cancer screening results with recommendation for 12 month follow up imaging. Also, upon return call will be notified of incidental finding noted below. This note will be forwarded to PCP via [facility].  IMPRESSION: 1. Lung-RADS Category 2, benign appearance or behavior. Continue annual screening with low-dose chest CT without contrast in 12 months. 2.  Coronary artery atherosclerosis. Aortic atherosclerosis. 3. Subtly irregular hepatic capsule. Cannot exclude mild cirrhosis. Correlate with risk factors. Consider dedicated abdominal imaging, possibly with abdominal ultrasound/elastography.

## 2016-06-11 DIAGNOSIS — Z87891 Personal history of nicotine dependence: Secondary | ICD-10-CM

## 2016-06-11 HISTORY — DX: Personal history of nicotine dependence: Z87.891

## 2016-06-11 NOTE — Progress Notes (Signed)
In accordance with CMS guidelines, patient has met eligibility criteria including age, absence of signs or symptoms of lung cancer.  Social History  Substance Use Topics  . Smoking status: Current Some Day Smoker    Packs/day: 0.65    Years: 52.00    Types: E-cigarettes, Cigarettes    Last attempt to quit: 03/22/2016  . Smokeless tobacco: Never Used  . Alcohol use 0.6 oz/week    1 Cans of beer per week     A shared decision-making session was conducted prior to the performance of CT scan. This includes one or more decision aids, includes benefits and harms of screening, follow-up diagnostic testing, over-diagnosis, false positive rate, and total radiation exposure.  Counseling on the importance of adherence to annual lung cancer LDCT screening, impact of co-morbidities, and ability or willingness to undergo diagnosis and treatment is imperative for compliance of the program.  Counseling on the importance of continued smoking cessation for former smokers; the importance of smoking cessation for current smokers, and information about tobacco cessation interventions have been given to patient including [place] and [facility] quit [place] programs.  Written order for lung cancer screening with LDCT has been given to the patient and any and all questions have been answered to the best of my abilities.   Yearly follow up will be coordinated by Burgess Estelle, Thoracic Navigator.  Faythe Casa, NP

## 2016-06-12 ENCOUNTER — Other Ambulatory Visit: Payer: Self-pay | Admitting: Internal Medicine

## 2016-06-14 ENCOUNTER — Telehealth: Payer: Self-pay | Admitting: Internal Medicine

## 2016-06-14 NOTE — Telephone Encounter (Signed)
Pharmacy requesting atorvastatin refill. Per Dr. Darnelle Bos November notes, pt to f/u PRN. Atorvastatin can be refilled by PCP. S/w pt who confirms she still has some left and will call Dr. Sanda Klein for refills. She is appreciative of the call.

## 2016-06-14 NOTE — Telephone Encounter (Signed)
Pt requesting Rx Atorvastatin 40 mg tablet, Please advise if ok to refill?

## 2016-06-21 ENCOUNTER — Other Ambulatory Visit: Payer: Self-pay | Admitting: Internal Medicine

## 2016-07-05 ENCOUNTER — Telehealth: Payer: Self-pay | Admitting: Family Medicine

## 2016-07-05 NOTE — Telephone Encounter (Signed)
Please ask patient to schedule an appointment with me to go over her CT scan results; thank you

## 2016-07-06 NOTE — Telephone Encounter (Signed)
appt made for 07/12/16

## 2016-07-12 ENCOUNTER — Ambulatory Visit (INDEPENDENT_AMBULATORY_CARE_PROVIDER_SITE_OTHER): Payer: Medicare Other | Admitting: Family Medicine

## 2016-07-12 ENCOUNTER — Encounter: Payer: Self-pay | Admitting: Family Medicine

## 2016-07-12 DIAGNOSIS — J432 Centrilobular emphysema: Secondary | ICD-10-CM

## 2016-07-12 DIAGNOSIS — R932 Abnormal findings on diagnostic imaging of liver and biliary tract: Secondary | ICD-10-CM

## 2016-07-12 DIAGNOSIS — I700 Atherosclerosis of aorta: Secondary | ICD-10-CM | POA: Insufficient documentation

## 2016-07-12 DIAGNOSIS — I2510 Atherosclerotic heart disease of native coronary artery without angina pectoris: Secondary | ICD-10-CM | POA: Diagnosis not present

## 2016-07-12 DIAGNOSIS — Z720 Tobacco use: Secondary | ICD-10-CM | POA: Diagnosis not present

## 2016-07-12 DIAGNOSIS — E784 Other hyperlipidemia: Secondary | ICD-10-CM | POA: Diagnosis not present

## 2016-07-12 DIAGNOSIS — K7460 Unspecified cirrhosis of liver: Secondary | ICD-10-CM | POA: Diagnosis not present

## 2016-07-12 DIAGNOSIS — E7849 Other hyperlipidemia: Secondary | ICD-10-CM

## 2016-07-12 HISTORY — DX: Atherosclerosis of aorta: I70.0

## 2016-07-12 HISTORY — DX: Centrilobular emphysema: J43.2

## 2016-07-12 HISTORY — DX: Atherosclerotic heart disease of native coronary artery without angina pectoris: I25.10

## 2016-07-12 HISTORY — DX: Abnormal findings on diagnostic imaging of liver and biliary tract: R93.2

## 2016-07-12 LAB — COMPLETE METABOLIC PANEL WITH GFR
ALT: 15 U/L (ref 6–29)
AST: 23 U/L (ref 10–35)
Albumin: 3.8 g/dL (ref 3.6–5.1)
Alkaline Phosphatase: 80 U/L (ref 33–130)
BUN: 12 mg/dL (ref 7–25)
CHLORIDE: 104 mmol/L (ref 98–110)
CO2: 29 mmol/L (ref 20–31)
Calcium: 9.1 mg/dL (ref 8.6–10.4)
Creat: 0.78 mg/dL (ref 0.50–0.99)
GFR, EST NON AFRICAN AMERICAN: 78 mL/min (ref >=60)
Glucose, Bld: 77 mg/dL (ref 65–99)
Potassium: 4.5 mmol/L (ref 3.5–5.3)
SODIUM: 139 mmol/L (ref 135–146)
Total Bilirubin: 0.4 mg/dL (ref 0.2–1.2)
Total Protein: 7 g/dL (ref 6.1–8.1)

## 2016-07-12 MED ORDER — ATORVASTATIN CALCIUM 40 MG PO TABS: 40.0000 mg | ORAL_TABLET | Freq: Every day | ORAL | 1 refills | Status: DC

## 2016-07-12 NOTE — Assessment & Plan Note (Signed)
Discussed with patient; urged her to quit smoking

## 2016-07-12 NOTE — Assessment & Plan Note (Signed)
Back on aspirin, back on statin; recheck lipids in 6 weeks; note to Dr. Saunders Revel to see if any additional testing needed

## 2016-07-12 NOTE — Patient Instructions (Addendum)
Recheck cholesterol in 6 weeks after you've had a chance to get back on the atorvastatin We'll get other labs today I do encourage you to quit smoking Call [phone] to sign up for smoking cessation classes You can call 1-800-QUIT-NOW to talk with a smoking cessation coach I am proud of you for cutting back -- take that next step and quit!

## 2016-07-12 NOTE — Progress Notes (Signed)
BP 118/62   Pulse 93   Temp 98.3 F (36.8 C) (Oral)   Resp 16   Wt 153 lb 3.2 oz (69.5 kg)   SpO2 96%   BMI 23.99 kg/m    Subjective:    Patient ID: Julie Beck, female    DOB: 08/23/1945, 72 y.o.   MRN: [id number]  HPI: Julie Beck is a 71 y.o. female  Chief Complaint  Patient presents with  . Results    review CT     Patient is here to review her CT scan results  CLINICAL DATA:  Asymptomatic current smoker with 34 pack-year history.  EXAM: CT CHEST WITHOUT CONTRAST LOW-DOSE FOR LUNG CANCER SCREENING  TECHNIQUE: Multidetector CT imaging of the chest was performed following the standard protocol without IV contrast.  COMPARISON:  None.  FINDINGS: Cardiovascular: Aortic and branch vessel atherosclerosis. Tortuous thoracic aorta. Mild cardiomegaly. Multivessel coronary artery atherosclerosis.  Mediastinum/Nodes: No mediastinal or definite hilar adenopathy, given limitations of unenhanced CT.  Lungs/Pleura: No pleural fluid. Mild biapical pleural-parenchymal scarring. mild centrilobular emphysema. Bilateral solid pulmonary nodules. The largest, in the right upper lobe, may represent an area of scarring, given morphology. This measures volume derived equivalent diameter 5.7 mm, including on image 106/ series 3. A left upper lobe ground-glass nodule measures volume derived equivalent diameter 11.9 mm, including on image 101/ series 3.  Upper Abdomen: Subtly irregular hepatic capsule, including on image 56/series 2. Normal imaged portions of the spleen, stomach, adrenal glands, kidneys. Abdominal aortic atherosclerosis.  Musculoskeletal: No acute osseous abnormality. Moderate thoracic spondylosis with convex left thoracolumbar spine fixation.  IMPRESSION: 1. Lung-RADS Category 2, benign appearance or behavior. Continue annual screening with low-dose chest CT without contrast in 12 months. 2.  Coronary artery atherosclerosis. Aortic  atherosclerosis. 3. Subtly irregular hepatic capsule. Cannot exclude mild cirrhosis. Correlate with risk factors. Consider dedicated abdominal imaging, possibly with abdominal ultrasound/elastography.   Electronically Signed   By: Abigail Miyamoto M.D.   On: 06/10/2016 09:21 ------------------------------------- Her MGF had a heart attack in his early 50's Currently smoking less than 1 pack a week She eats hamburgers; bacon once a week, two slices Usually has oatmeal for breakfast Stays active  Dr. Saunders Revel prescribed the atorvastatin last but she ran out; ran out almost a month ago I never received a request, and he denied it thinking I was going to do it; chart reviewed, explained what happened to patient She quit taking aspirin She has not knowledge of hepatitis No blood transfusion No IVDU  Has a deer tick in the left groin; just a scab now; no joint aches; just wants to make sure head is gone  Depression screen [facility] 2/9 07/12/2016 05/12/2016 03/17/2016  Decreased Interest 0 0 0  Down, Depressed, Hopeless 0 0 0  PHQ - 2 Score 0 0 0   Relevant past medical, surgical, family and social history reviewed Past Medical History:  Diagnosis Date  . Abnormal CT of liver 07/12/2016   Chest CT Feb 2018  . Arthritis   . Centrilobular emphysema ([place]) 07/12/2016   Chest CT Feb 2018  . Coronary artery disease 07/12/2016   Followed by Dr. Saunders Revel, seen on chest CT Feb 2018  . Hyperlipidemia   . Overweight (BMI 25.0-29.9) 03/17/2016  . Thoracic aortic atherosclerosis ([place]) 07/12/2016   Chest CT scan Feb 2018  . Tobacco abuse 03/17/2016   Past Surgical History:  Procedure Laterality Date  . INGUINAL HERNIA REPAIR Bilateral   . KNEE ARTHROSCOPY Bilateral   .  TUBAL LIGATION     Family History  Problem Relation Age of Onset  . Alzheimer's disease Mother   . Breast cancer Mother   . Cancer Father     lung  . Dementia Sister   . Breast cancer Paternal Aunt   . Cancer Maternal Grandmother      breast  . Heart attack Maternal Grandfather    Social History  Substance Use Topics  . Smoking status: Current Some Day Smoker    Packs/day: 0.65    Years: 52.00    Types: Cigarettes    Last attempt to quit: 03/22/2016  . Smokeless tobacco: Never Used  . Alcohol use 0.6 oz/week    1 Cans of beer per week   Interim medical history since last visit reviewed. Allergies and medications reviewed  Review of Systems Per HPI unless specifically indicated above     Objective:    BP 118/62   Pulse 93   Temp 98.3 F (36.8 C) (Oral)   Resp 16   Wt 153 lb 3.2 oz (69.5 kg)   SpO2 96%   BMI 23.99 kg/m   Wt Readings from Last 3 Encounters:  07/12/16 153 lb 3.2 oz (69.5 kg)  06/09/16 157 lb (71.2 kg)  05/12/16 157 lb 3 oz (71.3 kg)    Physical Exam  Constitutional: She appears well-developed and well-nourished. No distress.  Eyes: EOM are normal. No scleral icterus.  Neck: No thyromegaly present.  Cardiovascular: Normal rate.   Pulmonary/Chest: Effort normal.  Abdominal: She exhibits no distension.  Skin: No pallor.  Small scab on the left anterior hip; no sign of arthropod or foreign body  Psychiatric: She has a normal mood and affect. Her behavior is normal. Judgment and thought content normal. Her mood appears not anxious. She does not exhibit a depressed mood.   Results for orders placed or performed in visit on 07/12/16  COMPLETE METABOLIC PANEL WITH GFR  Result Value Ref Range   Sodium 139 135 - 146 mmol/L   Potassium 4.5 3.5 - 5.3 mmol/L   Chloride 104 98 - 110 mmol/L   CO2 29 20 - 31 mmol/L   Glucose, Bld 77 65 - 99 mg/dL   BUN 12 7 - 25 mg/dL   Creat 0.78 0.50 - 0.99 mg/dL   Total Bilirubin 0.4 0.2 - 1.2 mg/dL   Alkaline Phosphatase 80 33 - 130 U/L   AST 23 10 - 35 U/L   ALT 15 6 - 29 U/L   Total Protein 7.0 6.1 - 8.1 g/dL   Albumin 3.8 3.6 - 5.1 g/dL   Calcium 9.1 8.6 - 10.4 mg/dL   GFR, Est African American >89 >=60 mL/min   GFR, Est Non African American 78  >=60 mL/min  Hepatitis, Acute  Result Value Ref Range   Hepatitis B Surface Ag NEGATIVE NEGATIVE   HCV Ab NEGATIVE NEGATIVE   Hep B C IgM NON REACTIVE NON REACTIVE   Hep A IgM NON REACTIVE NON REACTIVE      Assessment & Plan:   Problem List Items Addressed This Visit      Cardiovascular and Mediastinum   Thoracic aortic atherosclerosis ([place]) (Chronic)    Reviewed findings with her on chest CT, showed model of atherosclerotic plaque in artery cross-section; goal LDL less than 70; smoking cessation encouraged; statin and aspirin      Relevant Medications   atorvastatin (LIPITOR) 40 MG tablet   aspirin EC 81 MG tablet   Coronary artery disease (Chronic)  Back on aspirin, back on statin; recheck lipids in 6 weeks; note to Dr. Saunders Revel to see if any additional testing needed      Relevant Medications   atorvastatin (LIPITOR) 40 MG tablet   aspirin EC 81 MG tablet     Respiratory   Centrilobular emphysema ([facility]) (Chronic)    Discussed with patient; urged her to quit smoking        Other   Tobacco abuse    Encouraged cessation      Hyperlipidemia, familial, high LDL (Chronic)    Start back on statin; check labs; goal LDL is under 70      Relevant Medications   atorvastatin (LIPITOR) 40 MG tablet   aspirin EC 81 MG tablet   Abnormal CT of liver    Check hepatitis labs, LFTs      Relevant Orders   COMPLETE METABOLIC PANEL WITH GFR (Completed)   Hepatitis, Acute (Completed)      Follow up plan: Return in about 6 weeks (around 08/23/2016) for fasting labs only.  An after-visit summary was printed and given to the patient at [facility].  Please see the patient instructions which may contain other information and recommendations beyond what is mentioned above in the assessment and plan.  Meds ordered this encounter  Medications  . atorvastatin (LIPITOR) 40 MG tablet    Sig: Take 1 tablet (40 mg total) by mouth at bedtime.    Dispense:  30 tablet    Refill:  1  . aspirin  EC 81 MG tablet    Sig: Take 1 tablet (81 mg total) by mouth daily.    Orders Placed This Encounter  Procedures  . COMPLETE METABOLIC PANEL WITH GFR  . Hepatitis, Acute

## 2016-07-12 NOTE — Assessment & Plan Note (Addendum)
Start back on statin; check labs; goal LDL is under 70

## 2016-07-12 NOTE — Assessment & Plan Note (Signed)
Check hepatitis labs, LFTs

## 2016-07-13 LAB — HEPATITIS PANEL, ACUTE
HCV AB: NEGATIVE
HEP B C IGM: NONREACTIVE
Hep A IgM: NONREACTIVE
Hepatitis B Surface Ag: NEGATIVE

## 2016-07-19 ENCOUNTER — Other Ambulatory Visit: Payer: Self-pay

## 2016-07-19 NOTE — Assessment & Plan Note (Signed)
Encouraged cessation.

## 2016-07-19 NOTE — Assessment & Plan Note (Signed)
Reviewed findings with her on chest CT, showed model of atherosclerotic plaque in artery cross-section; goal LDL less than 70; smoking cessation encouraged; statin and aspirin

## 2016-07-20 ENCOUNTER — Encounter: Payer: Self-pay | Admitting: Family Medicine

## 2016-07-20 DIAGNOSIS — K7460 Unspecified cirrhosis of liver: Secondary | ICD-10-CM | POA: Insufficient documentation

## 2016-07-22 DIAGNOSIS — L0231 Cutaneous abscess of buttock: Secondary | ICD-10-CM | POA: Diagnosis not present

## 2016-07-30 NOTE — Telephone Encounter (Signed)
LVM for pt to return my call.

## 2016-08-04 ENCOUNTER — Telehealth: Payer: Self-pay | Admitting: Family Medicine

## 2016-08-04 NOTE — Telephone Encounter (Signed)
Left vm again for pt to return my call to schedule colonoscopy. Mailed letter.

## 2016-08-04 NOTE — Telephone Encounter (Signed)
-----   Message from Lieutenant Diego, RN sent at 08/04/2016  8:12 AM EDT ----- Regarding: RE: please look at CT scan report Dr. Sanda Klein, sorry for the delay. I did hear back from Dr. Weber Cooks and he confirmed the one year follow up recommendation. As I mentioned when we get to the 6 month mark I will see if we can get insurance to cover a follow up CT at that time. If not, it does technically fall into the 1 year follow up. Thanks. ----- Message ----- From: Arnetha Courser, MD Sent: 07/12/2016   2:20 PM To: Lieutenant Diego, RN Subject: please look at CT scan report                  Just wanted to make sure this was benign and one year is okay (???) --> A left upper lobe ground-glass nodule measures volume derived equivalent diameter 11.9 mm, including on image 101/ series 3.

## 2016-08-23 ENCOUNTER — Other Ambulatory Visit: Payer: Medicare Other

## 2016-08-23 ENCOUNTER — Other Ambulatory Visit: Payer: Self-pay

## 2016-08-23 DIAGNOSIS — E785 Hyperlipidemia, unspecified: Secondary | ICD-10-CM | POA: Diagnosis not present

## 2016-08-23 DIAGNOSIS — Z5181 Encounter for therapeutic drug level monitoring: Secondary | ICD-10-CM

## 2016-08-23 LAB — LIPID PANEL
Cholesterol: 168 mg/dL (ref <200)
HDL: 44 mg/dL — AB (ref >50)
LDL Cholesterol: 113 mg/dL — ABNORMAL HIGH (ref <100)
Total CHOL/HDL Ratio: 3.8 Ratio (ref <5.0)
Triglycerides: 55 mg/dL (ref <150)
VLDL: 11 mg/dL (ref <30)

## 2016-08-23 LAB — ALT: ALT: 15 U/L (ref 6–29)

## 2016-09-03 ENCOUNTER — Other Ambulatory Visit: Payer: Self-pay | Admitting: Family Medicine

## 2016-09-03 MED ORDER — ATORVASTATIN CALCIUM 40 MG PO TABS: 40.0000 mg | ORAL_TABLET | Freq: Every day | ORAL | 6 refills | Status: DC

## 2016-09-19 ENCOUNTER — Other Ambulatory Visit: Payer: Self-pay | Admitting: Family Medicine

## 2016-09-20 NOTE — Telephone Encounter (Signed)
rx request for atorvastatin Should not be needed Resolve with pharmacy please

## 2016-10-14 DIAGNOSIS — L255 Unspecified contact dermatitis due to plants, except food: Secondary | ICD-10-CM | POA: Diagnosis not present

## 2016-11-11 ENCOUNTER — Encounter: Payer: Self-pay | Admitting: Emergency Medicine

## 2016-11-11 ENCOUNTER — Emergency Department: Payer: Medicare Other

## 2016-11-11 ENCOUNTER — Emergency Department
Admission: EM | Admit: 2016-11-11 | Discharge: 2016-11-11 | Disposition: A | Discharge status: Home / Self Care | Payer: Medicare Other | Attending: Emergency Medicine | Admitting: Emergency Medicine

## 2016-11-11 DIAGNOSIS — Z7982 Long term (current) use of aspirin: Secondary | ICD-10-CM | POA: Diagnosis not present

## 2016-11-11 DIAGNOSIS — I2510 Atherosclerotic heart disease of native coronary artery without angina pectoris: Secondary | ICD-10-CM | POA: Insufficient documentation

## 2016-11-11 DIAGNOSIS — Z79899 Other long term (current) drug therapy: Secondary | ICD-10-CM | POA: Diagnosis not present

## 2016-11-11 DIAGNOSIS — R1032 Left lower quadrant pain: Secondary | ICD-10-CM | POA: Diagnosis not present

## 2016-11-11 DIAGNOSIS — R109 Unspecified abdominal pain: Secondary | ICD-10-CM | POA: Diagnosis not present

## 2016-11-11 DIAGNOSIS — F17210 Nicotine dependence, cigarettes, uncomplicated: Secondary | ICD-10-CM | POA: Diagnosis not present

## 2016-11-11 LAB — URINALYSIS, COMPLETE (UACMP) WITH MICROSCOPIC
Bacteria, UA: NONE SEEN
Bilirubin Urine: NEGATIVE
GLUCOSE, UA: NEGATIVE mg/dL
Ketones, ur: NEGATIVE mg/dL
Nitrite: NEGATIVE
PH: 5 (ref 5.0–8.0)
Protein, ur: NEGATIVE mg/dL
SPECIFIC GRAVITY, URINE: 1.023 (ref 1.005–1.030)

## 2016-11-11 LAB — CBC
HCT: 42.1 % (ref 35.0–47.0)
Hemoglobin: 14.3 g/dL (ref 12.0–16.0)
MCH: 32.4 pg (ref 26.0–34.0)
MCHC: 34 g/dL (ref 32.0–36.0)
MCV: 95.2 fL (ref 80.0–100.0)
PLATELETS: 286 10*3/uL (ref 150–440)
RBC: 4.42 MIL/uL (ref 3.80–5.20)
RDW: 15.4 % — ABNORMAL HIGH (ref 11.5–14.5)
WBC: 5 10*3/uL (ref 3.6–11.0)

## 2016-11-11 LAB — COMPREHENSIVE METABOLIC PANEL
ALK PHOS: 103 U/L (ref 38–126)
ALT: 17 U/L (ref 14–54)
AST: 29 U/L (ref 15–41)
Albumin: 4 g/dL (ref 3.5–5.0)
Anion gap: 6 (ref 5–15)
BUN: 14 mg/dL (ref 6–20)
CO2: 28 mmol/L (ref 22–32)
Calcium: 9.2 mg/dL (ref 8.9–10.3)
Chloride: 101 mmol/L (ref 101–111)
Creatinine, Ser: 0.65 mg/dL (ref 0.44–1.00)
Glucose, Bld: 105 mg/dL — ABNORMAL HIGH (ref 65–99)
Potassium: 4.1 mmol/L (ref 3.5–5.1)
SODIUM: 135 mmol/L (ref 135–145)
Total Bilirubin: 0.8 mg/dL (ref 0.3–1.2)
Total Protein: 8.1 g/dL (ref 6.5–8.1)

## 2016-11-11 LAB — LIPASE, BLOOD: Lipase: 29 U/L (ref 11–51)

## 2016-11-11 MED ORDER — OXYCODONE-ACETAMINOPHEN 5-325 MG PO TABS
1.0000 | ORAL_TABLET | Freq: Once | ORAL | Status: AC
  Administered 2016-11-11: 1 via ORAL
  Filled 2016-11-11: qty 1

## 2016-11-11 MED ORDER — OXYCODONE-ACETAMINOPHEN 5-325 MG PO TABS
1.0000 | ORAL_TABLET | ORAL | 0 refills | Status: DC | PRN
Start: 2016-11-11 — End: 2017-10-27

## 2016-11-11 NOTE — ED Notes (Signed)
Emma RN aware of patient placement in room.

## 2016-11-11 NOTE — ED Notes (Signed)
Patient in [facility] in [place].  Explained wait.

## 2016-11-11 NOTE — ED Provider Notes (Signed)
[facility] Emergency Department Provider Note   ____________________________________________   I have reviewed the triage vital signs and the nursing notes.   HISTORY  Chief Complaint Abdominal Pain   History limited by: Not Limited   HPI Julie Beck is a 71 y.o. female who presents to the emergency department today because of concerns for abdominal pain. It is located in the left lower quadrant. It started 5 days ago. Somewhat intermittent. It is severe. Has been accompanied by some nausea, no true vomiting. No change in urination or defecation. No fevers. Never had symptoms like this before.   Past Medical History:  Diagnosis Date  . Abnormal CT of liver 07/12/2016   Chest CT Feb 2018  . Arthritis   . Centrilobular emphysema ([place]) 07/12/2016   Chest CT Feb 2018  . Coronary artery disease 07/12/2016   Followed by Dr. Saunders Revel, seen on chest CT Feb 2018  . Hyperlipidemia   . Overweight (BMI 25.0-29.9) 03/17/2016  . Thoracic aortic atherosclerosis ([place]) 07/12/2016   Chest CT scan Feb 2018  . Tobacco abuse 03/17/2016    Patient Active Problem List   Diagnosis Date Noted  . Cirrhosis of liver ([place]) 07/20/2016  . Centrilobular emphysema ([place]) 07/12/2016  . Thoracic aortic atherosclerosis ([place]) 07/12/2016  . Abnormal CT of liver 07/12/2016  . Coronary artery disease 07/12/2016  . Personal history of tobacco use, presenting hazards to health 06/11/2016  . Positive colorectal cancer screening using DNA-based stool test 06/07/2016  . Colon cancer screening 05/12/2016  . Postmenopausal 05/12/2016  . Screening for osteoporosis 05/12/2016  . Preventative health care 05/12/2016  . Hyperlipidemia, familial, high LDL 03/18/2016  . Medication monitoring encounter 03/18/2016  . Abnormal EKG 03/17/2016  . Tobacco abuse 03/17/2016  . GERD (gastroesophageal reflux disease) 03/17/2016  . Epigastric abdominal pain 03/17/2016  . Breast cancer screening 03/17/2016  .  Overweight (BMI 25.0-29.9) 03/17/2016    Past Surgical History:  Procedure Laterality Date  . INGUINAL HERNIA REPAIR Bilateral   . KNEE ARTHROSCOPY Bilateral   . TUBAL LIGATION      Prior to Admission medications   Medication Sig Start Date End Date Taking? Authorizing Provider  aspirin EC 81 MG tablet Take 1 tablet (81 mg total) by mouth daily. 07/12/16  Yes Lada, Satira Anis, MD  atorvastatin (LIPITOR) 40 MG tablet Take 1 tablet (40 mg total) by mouth at bedtime. 09/03/16  Yes Lada, Satira Anis, MD  calcium-vitamin D (OSCAL WITH D) 500-200 MG-UNIT tablet Take 1 tablet by mouth every morning.   Yes [provider]  Multiple Vitamin (MULTIVITAMIN) tablet Take 1 tablet by mouth daily.   Yes [provider]    Allergies Sulfa antibiotics  Family History  Problem Relation Age of Onset  . Alzheimer's disease Mother   . Breast cancer Mother   . Cancer Father        lung  . Dementia Sister   . Breast cancer Paternal Aunt   . Cancer Maternal Grandmother        breast  . Heart attack Maternal Grandfather     Social History Social History  Substance Use Topics  . Smoking status: Current Some Day Smoker    Packs/day: 0.65    Years: 52.00    Types: Cigarettes    Last attempt to quit: 03/22/2016  . Smokeless tobacco: Never Used  . Alcohol use 0.6 oz/week    1 Cans of beer per week    Review of Systems Constitutional: No  fever/chills Eyes: No visual changes. ENT: No sore throat. Cardiovascular: Denies chest pain. Respiratory: Denies shortness of breath. Gastrointestinal: Positive for left lower quadrant pain. Genitourinary: Negative for dysuria. Musculoskeletal: Negative for back pain. Skin: Negative for rash. Neurological: Negative for headaches, focal weakness or numbness.  ____________________________________________   PHYSICAL EXAM:  VITAL SIGNS: ED Triage Vitals  Enc Vitals Group     BP 11/11/16 0816 (!) 149/88     Pulse Rate 11/11/16 0816 71      Resp 11/11/16 0816 18     Temp 11/11/16 0816 97.8 F (36.6 C)     Temp Source 11/11/16 0816 Oral     SpO2 11/11/16 0816 98 %     Weight 11/11/16 0816 156 lb (70.8 kg)     Height 11/11/16 0816 5\' 8"  (1.727 m)     Head Circumference --      Peak Flow --      Pain Score 11/11/16 0818 10   Constitutional: Alert and oriented. Appears uncomfortable.  Eyes: Conjunctivae are normal.  ENT   Head: Normocephalic and atraumatic.   Nose: No congestion/rhinnorhea.   Mouth/Throat: Mucous membranes are moist.   Neck: No stridor. Hematological/Lymphatic/Immunilogical: No cervical lymphadenopathy. Cardiovascular: Normal rate, regular rhythm.  No murmurs, rubs, or gallops.  Respiratory: Normal respiratory effort without tachypnea nor retractions. Breath sounds are clear and equal bilaterally. No wheezes/rales/rhonchi. Gastrointestinal: Soft and mildly tender to palpation in the left lower quadrant.  No rebound. No guarding.  Genitourinary: Deferred Musculoskeletal: Normal range of motion in all extremities. No lower extremity edema. Neurologic:  Normal speech and language. No gross focal neurologic deficits are appreciated.  Skin:  Skin is warm, dry and intact. No rash noted. Psychiatric: Mood and affect are normal. Speech and behavior are normal. Patient exhibits appropriate insight and judgment.  ____________________________________________    LABS (pertinent positives/negatives)  Labs Reviewed  COMPREHENSIVE METABOLIC PANEL - Abnormal; Notable for the following:       Result Value   Glucose, Bld 105 (*)    All other components within normal limits  CBC - Abnormal; Notable for the following:    RDW 15.4 (*)    All other components within normal limits  URINALYSIS, COMPLETE (UACMP) WITH MICROSCOPIC - Abnormal; Notable for the following:    Color, Urine YELLOW (*)    APPearance HAZY (*)    Hgb urine dipstick SMALL (*)    Leukocytes, UA MODERATE (*)    Squamous Epithelial / LPF  6-30 (*)    All other components within normal limits  LIPASE, BLOOD     ____________________________________________   EKG  None  ____________________________________________    RADIOLOGY  CT renal IMPRESSION: No renal or ureteral stones. No hydronephrosis.  Aortoiliac atherosclerosis.  No acute findings in the abdomen or pelvis.  ____________________________________________   PROCEDURES  Procedures  ____________________________________________   INITIAL IMPRESSION / ASSESSMENT AND PLAN / ED COURSE  Pertinent labs & imaging results that were available during my care of the patient were reviewed by me and considered in my medical decision making (see chart for details).  Patient presented to the emergency department today with left lower quadrant abdominal pain. She did have some red blood cells in her urine. CT renal without concerning findings. At this point however I do think it likely patient passed a kidney stone recently. Did discuss return precautions with the patient.   ____________________________________________   FINAL CLINICAL IMPRESSION(S) / ED DIAGNOSES  Final diagnoses:  Left flank pain  Note: This dictation was prepared with Dragon dictation. Any transcriptional errors that result from this process are unintentional     Nance Pear, MD 11/11/16 1131

## 2016-11-11 NOTE — Discharge Instructions (Signed)
Please seek medical attention for any high fevers, chest pain, shortness of breath, change in behavior, persistent vomiting, bloody stool or any other new or concerning symptoms.  

## 2016-11-11 NOTE — ED Triage Notes (Signed)
Pt c/o LLQ pain since end of last week. No vomiting or diarrhea. No fevers.  No urinary sx. No discharge.  Only symptom is pain. NAD. VSS

## 2016-11-12 ENCOUNTER — Telehealth: Payer: Self-pay

## 2016-11-12 NOTE — Telephone Encounter (Signed)
Pt states she was admitted to the ER due to flank pain. Pt has kidney stones and she received oxycodone to help with the pain while passing the stones. Pt states she passed one stone and it was torn while passing the stone. Pt states she is in servere pain and would like to see if she can get couple of more pain meds. Mention to pt Dr. lada is out and we defiantly will like for her to be seen. I suggest for her to see Raquel Sarna today pt refuse she would like to see Dr. Sanda Klein. I stated to pt Dr. lada  Is booked and she might not get an appt with her soon she comes back. Pt understood and transfer the pt to [facility].

## 2016-11-15 ENCOUNTER — Encounter: Payer: Self-pay | Admitting: Family Medicine

## 2016-11-15 ENCOUNTER — Ambulatory Visit (INDEPENDENT_AMBULATORY_CARE_PROVIDER_SITE_OTHER): Payer: Medicare Other | Admitting: Family Medicine

## 2016-11-15 VITALS — BP 130/80 | HR 93 | Temp 98.4°F | Resp 16 | Ht 68.0 in | Wt 144.0 lb

## 2016-11-15 DIAGNOSIS — R109 Unspecified abdominal pain: Secondary | ICD-10-CM | POA: Diagnosis not present

## 2016-11-15 DIAGNOSIS — N3091 Cystitis, unspecified with hematuria: Secondary | ICD-10-CM

## 2016-11-15 DIAGNOSIS — N390 Urinary tract infection, site not specified: Secondary | ICD-10-CM | POA: Diagnosis not present

## 2016-11-15 DIAGNOSIS — R8281 Pyuria: Secondary | ICD-10-CM

## 2016-11-15 DIAGNOSIS — R3129 Other microscopic hematuria: Secondary | ICD-10-CM | POA: Diagnosis not present

## 2016-11-15 DIAGNOSIS — R194 Change in bowel habit: Secondary | ICD-10-CM

## 2016-11-15 LAB — POCT URINALYSIS DIPSTICK
BILIRUBIN UA: NEGATIVE
GLUCOSE UA: NEGATIVE
Ketones, UA: 15
Nitrite, UA: NEGATIVE
Protein, UA: 30
SPEC GRAV UA: 1.015 (ref 1.010–1.025)
Urobilinogen, UA: 0.2 E.U./dL
pH, UA: 5 (ref 5.0–8.0)

## 2016-11-15 MED ORDER — NITROFURANTOIN MONOHYD MACRO 100 MG PO CAPS: 100.0000 mg | ORAL_CAPSULE | Freq: Two times a day (BID) | ORAL | 0 refills | Status: AC

## 2016-11-15 MED ORDER — POLYETHYLENE GLYCOL 3350 17 GM/SCOOP PO POWD: 17.0000 g | Freq: Every day | ORAL | 1 refills | Status: DC

## 2016-11-15 MED ORDER — DICLOFENAC SODIUM 75 MG PO TBEC: 75.0000 mg | DELAYED_RELEASE_TABLET | Freq: Two times a day (BID) | ORAL | 0 refills | Status: DC

## 2016-11-15 NOTE — Progress Notes (Addendum)
Name: Julie Beck   MRN: [id number]    DOB: 1945/08/17   Date:11/15/2016       Progress Note  Subjective  Chief Complaint  Chief Complaint  Patient presents with  . Follow-up    ER for kidney stones    HPI  Pt presents for ER follow up.  She was seen last week for LLQ pain and was told she likely passed a kidney stone and was having residual pain. CT scan, Labs and notes reviewed from ER visit. CT renal study unremarkable, UA in the positive for small amnt Hgb and moderate Leukocytes. Was not given antibiotics, was given Roxicet.  Urine is clear/pale yellow now.  No dysuria; has increased fluid intake and stopped sodas/coffee and has been urinating a little more than usual because of increased hydration.  Pt is allergic to Sulfas, so she cannot take Flomax.  Endorses occasional nausea, LLQ/left groin pain persists intermittently (worse when in certain positions and when waking up in the morning), some mild low back pain.  No vomiting, or diarrhea, no fevers/chills. Endorses some decrease in frequency of BM's and harder than usual stool. No mucus or hematochezia/melena.  Patient Active Problem List   Diagnosis Date Noted  . Cirrhosis of liver ([place]) 07/20/2016  . Centrilobular emphysema ([place]) 07/12/2016  . Thoracic aortic atherosclerosis ([place]) 07/12/2016  . Abnormal CT of liver 07/12/2016  . Coronary artery disease 07/12/2016  . Personal history of tobacco use, presenting hazards to health 06/11/2016  . Positive colorectal cancer screening using DNA-based stool test 06/07/2016  . Colon cancer screening 05/12/2016  . Postmenopausal 05/12/2016  . Screening for osteoporosis 05/12/2016  . Preventative health care 05/12/2016  . Hyperlipidemia, familial, high LDL 03/18/2016  . Medication monitoring encounter 03/18/2016  . Abnormal EKG 03/17/2016  . Tobacco abuse 03/17/2016  . GERD (gastroesophageal reflux disease) 03/17/2016  . Epigastric abdominal pain 03/17/2016  . Breast cancer  screening 03/17/2016  . Overweight (BMI 25.0-29.9) 03/17/2016    Social History  Substance Use Topics  . Smoking status: Current Some Day Smoker    Packs/day: 0.65    Years: 52.00    Types: Cigarettes    Last attempt to quit: 03/22/2016  . Smokeless tobacco: Never Used  . Alcohol use 0.6 oz/week    1 Cans of beer per week     Current Outpatient Prescriptions:  .  aspirin EC 81 MG tablet, Take 1 tablet (81 mg total) by mouth daily., Disp: , Rfl:  .  atorvastatin (LIPITOR) 40 MG tablet, Take 1 tablet (40 mg total) by mouth at bedtime., Disp: 30 tablet, Rfl: 6 .  calcium-vitamin D (OSCAL WITH D) 500-200 MG-UNIT tablet, Take 1 tablet by mouth every morning., Disp: , Rfl:  .  Multiple Vitamin (MULTIVITAMIN) tablet, Take 1 tablet by mouth daily., Disp: , Rfl:  .  diclofenac (VOLTAREN) 75 MG EC tablet, Take 1 tablet (75 mg total) by mouth 2 (two) times daily., Disp: 15 tablet, Rfl: 0 .  oxyCODONE-acetaminophen (ROXICET) 5-325 MG tablet, Take 1 tablet by mouth every 4 (four) hours as needed for severe pain. (Patient not taking: Reported on 11/15/2016), Disp: 8 tablet, Rfl: 0  Allergies  Allergen Reactions  . Sulfa Antibiotics Hives    ROS  Ten systems reviewed and is negative except as mentioned in HPI   Objective  Vitals:   11/15/16 0815  BP: 130/80  Pulse: 93  Resp: 16  Temp: 98.4 F (36.9 C)  TempSrc: Oral  SpO2: 94%  Weight:  144 lb (65.3 kg)  Height: '5\' 8"'$  (1.727 m)   Body mass index is 21.9 kg/m.  Nursing Note and Vital Signs reviewed.  Physical Exam   Constitutional: Patient appears well-developed and well-nourished. No distress.  HEENT: head atraumatic, normocephalic Cardiovascular: Normal rate, regular rhythm, S1/S2 present.  No murmur or rub heard. No BLE edema. Pulmonary/Chest: Effort normal and breath sounds clear. No respiratory distress or retractions. Abdominal: Soft and non-tender, bowel sounds present x4 quadrants.  No CVA Tenderness Psychiatric:  Patient has a normal mood and affect. behavior is normal. Judgment and thought content normal.  Recent Results (from the past 2160 hour(s))  ALT     Status: None   Collection Time: 08/23/16  8:12 AM  Result Value Ref Range   ALT 15 6 - 29 U/L  Lipid panel     Status: Abnormal   Collection Time: 08/23/16  8:12 AM  Result Value Ref Range   Cholesterol 168 <200 mg/dL   Triglycerides 55 <150 mg/dL   HDL 44 (L) >50 mg/dL   Total CHOL/HDL Ratio 3.8 <5.0 Ratio   VLDL 11 <30 mg/dL   LDL Cholesterol 113 (H) <100 mg/dL  Lipase, blood     Status: None   Collection Time: 11/11/16  8:17 AM  Result Value Ref Range   Lipase 29 11 - 51 U/L  Comprehensive metabolic panel     Status: Abnormal   Collection Time: 11/11/16  8:17 AM  Result Value Ref Range   Sodium 135 135 - 145 mmol/L   Potassium 4.1 3.5 - 5.1 mmol/L   Chloride 101 101 - 111 mmol/L   CO2 28 22 - 32 mmol/L   Glucose, Bld 105 (H) 65 - 99 mg/dL   BUN 14 6 - 20 mg/dL   Creatinine, Ser 0.65 0.44 - 1.00 mg/dL   Calcium 9.2 8.9 - 10.3 mg/dL   Total Protein 8.1 6.5 - 8.1 g/dL   Albumin 4.0 3.5 - 5.0 g/dL   AST 29 15 - 41 U/L   ALT 17 14 - 54 U/L   Alkaline Phosphatase 103 38 - 126 U/L   Total Bilirubin 0.8 0.3 - 1.2 mg/dL   GFR calc non Af Amer >60 >60 mL/min   GFR calc Af Amer >60 >60 mL/min    Comment: (NOTE) The eGFR has been calculated using the CKD EPI equation. This calculation has not been validated in all clinical situations. eGFR's persistently <60 mL/min signify possible Chronic Kidney Disease.    Anion gap 6 5 - 15  CBC     Status: Abnormal   Collection Time: 11/11/16  8:17 AM  Result Value Ref Range   WBC 5.0 3.6 - 11.0 K/uL   RBC 4.42 3.80 - 5.20 MIL/uL   Hemoglobin 14.3 12.0 - 16.0 g/dL   HCT 42.1 35.0 - 47.0 %   MCV 95.2 80.0 - 100.0 fL   MCH 32.4 26.0 - 34.0 pg   MCHC 34.0 32.0 - 36.0 g/dL   RDW 15.4 (H) 11.5 - 14.5 %   Platelets 286 150 - 440 K/uL  Urinalysis, Complete w Microscopic     Status: Abnormal    Collection Time: 11/11/16  8:17 AM  Result Value Ref Range   Color, Urine YELLOW (A) YELLOW   APPearance HAZY (A) CLEAR   Specific Gravity, Urine 1.023 1.005 - 1.030   pH 5.0 5.0 - 8.0   Glucose, UA NEGATIVE NEGATIVE mg/dL   Hgb urine dipstick SMALL (A) NEGATIVE   Bilirubin  Urine NEGATIVE NEGATIVE   Ketones, ur NEGATIVE NEGATIVE mg/dL   Protein, ur NEGATIVE NEGATIVE mg/dL   Nitrite NEGATIVE NEGATIVE   Leukocytes, UA MODERATE (A) NEGATIVE   RBC / HPF 6-30 0 - 5 RBC/hpf   WBC, UA 0-5 0 - 5 WBC/hpf   Bacteria, UA NONE SEEN NONE SEEN   Squamous Epithelial / LPF 6-30 (A) NONE SEEN   Mucous PRESENT   POCT urinalysis dipstick     Status: Abnormal   Collection Time: 11/15/16  8:54 AM  Result Value Ref Range   Color, UA gold    Clarity, UA clear    Glucose, UA negative    Bilirubin, UA negative    Ketones, UA 15    Spec Grav, UA 1.015 1.010 - 1.025   Blood, UA large    pH, UA 5.0 5.0 - 8.0   Protein, UA 30    Urobilinogen, UA 0.2 0.2 or 1.0 E.U./dL   Nitrite, UA negative    Leukocytes, UA Small (1+) (A) Negative     Assessment & Plan  1. Cystitis with hematuria - nitrofurantoin, macrocrystal-monohydrate, (MACROBID) 100 MG capsule; Take 1 capsule (100 mg total) by mouth 2 (two) times daily.  Dispense: 10 capsule; Refill: 0  2. Pyuria - Urine Culture - POCT urinalysis dipstick - - POCT urinalysis dipstick - Lg amount hgb and small amount of leukocytes with Ketones 15.   3. Other microscopic hematuria - POCT urinalysis dipstick  4. Acute left flank pain - diclofenac (VOLTAREN) 75 MG EC tablet; Take 1 tablet (75 mg total) by mouth 2 (two) times daily.  Dispense: 15 tablet; Refill: 0 - POCT urinalysis dipstick  5. Recent change in frequency of bowel movements - polyethylene glycol powder (GLYCOLAX/MIRALAX) powder; Take 17 g by mouth daily.  Dispense: 3350 g; Refill: 1  -Due to patient's significant pain and ongoing symptoms, close follow up in 3 days is advised.  -Red  flags and when to present for emergency care or RTC including fever >101.68F, chest pain, shortness of breath, new/worsening/un-resolving symptoms, vomiting, frank blood in urine, increased pain unrelieved by medications provided reviewed with patient at time of visit. Follow up and care instructions discussed and provided in AVS.  I have reviewed this encounter including the documentation in this note and/or discussed this patient with the Johney Maine, FNP, NP-C. I am certifying that I agree with the content of this note as supervising physician.  Steele Sizer, MD [facility] 11/22/2016, 8:01 AM

## 2016-11-15 NOTE — Addendum Note (Signed)
Addended by: Hubbard Hartshorn on: 11/15/2016 01:36 PM   Modules accepted: Orders

## 2016-11-17 LAB — URINE CULTURE

## 2016-11-18 ENCOUNTER — Encounter: Payer: Self-pay | Admitting: Family Medicine

## 2016-11-18 ENCOUNTER — Ambulatory Visit (INDEPENDENT_AMBULATORY_CARE_PROVIDER_SITE_OTHER): Payer: Medicare Other | Admitting: Family Medicine

## 2016-11-18 VITALS — BP 132/76 | HR 78 | Temp 97.6°F | Resp 16 | Ht 68.0 in | Wt 145.1 lb

## 2016-11-18 DIAGNOSIS — R319 Hematuria, unspecified: Secondary | ICD-10-CM | POA: Diagnosis not present

## 2016-11-18 DIAGNOSIS — R109 Unspecified abdominal pain: Secondary | ICD-10-CM | POA: Diagnosis not present

## 2016-11-18 DIAGNOSIS — R1032 Left lower quadrant pain: Secondary | ICD-10-CM

## 2016-11-18 DIAGNOSIS — R194 Change in bowel habit: Secondary | ICD-10-CM

## 2016-11-18 LAB — POCT URINALYSIS DIPSTICK
Bilirubin, UA: NEGATIVE
Glucose, UA: NEGATIVE
NITRITE UA: NEGATIVE
PH UA: 5 (ref 5.0–8.0)
Spec Grav, UA: 1.02 (ref 1.010–1.025)
Urobilinogen, UA: 1 E.U./dL

## 2016-11-18 MED ORDER — MELOXICAM 15 MG PO TABS: 15.0000 mg | ORAL_TABLET | Freq: Every day | ORAL | 0 refills | Status: DC

## 2016-11-18 NOTE — Patient Instructions (Addendum)
-   Finish Nitrofurantoin (Antibiotic) - STOP Diclofenac (Voltaren) - Start Mobic once daily tonight at 7pm.  You may start with 1/2 a pill at first - Stay very hydrated.

## 2016-11-18 NOTE — Progress Notes (Signed)
Will discuss result at appointment today.

## 2016-11-18 NOTE — Progress Notes (Addendum)
Name: Julie Beck   MRN: [id number]    DOB: January 20, 1946   Date:11/18/2016       Progress Note  Subjective  Chief Complaint  Chief Complaint  Patient presents with  . Follow-up     3 day follow up    HPI  Pt presents to follow up on back pain and pyuria/hematuria.  She was seen 11/11/16 for LLQ pain and was told she likely passed a kidney stone and was having residual pain. CT renal study unremarkable, UA in the positive for small amnt Hgb and moderate Leukocytes during that visit. Urine Dip from office visit on 11/15/16 showed small leuks, 15 ketons, and lg Hgb; she is on day 4/5 of Nitrofurantoin, however Culture was negative.  She has been taking Voltaren and pain is down to an 8/10 (initially 10/10).  POCT Urine Dip today finds: Sm Hgb and Sm Leukocytes and Sm Ketones.  Constipation: Resolved Miralax, is having softer stools now, advised she may stop Miralax and use PRN.  No mucus, hematochezia or melena.   No dysuria; has increased fluid intake and stopped sodas/coffee and has been urinating a little more than usual because of increased hydration.  Endorses occasional nausea, LLQ/left groin pain persists intermittently (worse when in certain positions and when waking up in the morning), some mild low back pain.  No vomiting, or diarrhea, no fevers/chills.  Patient Active Problem List   Diagnosis Date Noted  . Cirrhosis of liver ([place]) 07/20/2016  . Centrilobular emphysema ([place]) 07/12/2016  . Thoracic aortic atherosclerosis ([place]) 07/12/2016  . Abnormal CT of liver 07/12/2016  . Coronary artery disease 07/12/2016  . Personal history of tobacco use, presenting hazards to health 06/11/2016  . Positive colorectal cancer screening using DNA-based stool test 06/07/2016  . Colon cancer screening 05/12/2016  . Postmenopausal 05/12/2016  . Screening for osteoporosis 05/12/2016  . Preventative health care 05/12/2016  . Hyperlipidemia, familial, high LDL 03/18/2016  . Medication monitoring  encounter 03/18/2016  . Abnormal EKG 03/17/2016  . Tobacco abuse 03/17/2016  . GERD (gastroesophageal reflux disease) 03/17/2016  . Epigastric abdominal pain 03/17/2016  . Breast cancer screening 03/17/2016  . Overweight (BMI 25.0-29.9) 03/17/2016    Social History  Substance Use Topics  . Smoking status: Current Some Day Smoker    Packs/day: 0.65    Years: 52.00    Types: Cigarettes    Last attempt to quit: 03/22/2016  . Smokeless tobacco: Never Used  . Alcohol use 0.6 oz/week    1 Cans of beer per week     Current Outpatient Prescriptions:  .  aspirin EC 81 MG tablet, Take 1 tablet (81 mg total) by mouth daily., Disp: , Rfl:  .  atorvastatin (LIPITOR) 40 MG tablet, Take 1 tablet (40 mg total) by mouth at bedtime., Disp: 30 tablet, Rfl: 6 .  calcium-vitamin D (OSCAL WITH D) 500-200 MG-UNIT tablet, Take 1 tablet by mouth every morning., Disp: , Rfl:  .  Multiple Vitamin (MULTIVITAMIN) tablet, Take 1 tablet by mouth daily., Disp: , Rfl:  .  nitrofurantoin, macrocrystal-monohydrate, (MACROBID) 100 MG capsule, Take 1 capsule (100 mg total) by mouth 2 (two) times daily., Disp: 10 capsule, Rfl: 0 .  oxyCODONE-acetaminophen (ROXICET) 5-325 MG tablet, Take 1 tablet by mouth every 4 (four) hours as needed for severe pain., Disp: 8 tablet, Rfl: 0 .  polyethylene glycol powder (GLYCOLAX/MIRALAX) powder, Take 17 g by mouth daily., Disp: 3350 g, Rfl: 1  Allergies  Allergen Reactions  . Sulfa Antibiotics  Hives    ROS  Ten systems reviewed and is negative except as mentioned in HPI  Objective  Vitals:   11/18/16 0757  BP: 132/76  Pulse: 78  Resp: 16  Temp: 97.6 F (36.4 C)  TempSrc: Oral  SpO2: 95%  Weight: 145 lb 1.6 oz (65.8 kg)  Height: '5\' 8"'$  (1.727 m)   Body mass index is 22.06 kg/m.  Nursing Note and Vital Signs reviewed.  Physical Exam  Constitutional: Patient appears well-developed and well-nourished. No distress.  HEENT: head atraumatic,  normocephalic Cardiovascular: Normal rate, regular rhythm, S1/S2 present.  No murmur or rub heard. No BLE edema. Pulmonary/Chest: Effort normal and breath sounds clear. No respiratory distress or retractions. Abdominal: Soft and non-tender on palpation, bowel sounds present x4 quadrants.  No CVA Tenderness Psychiatric: Patient has a normal mood and affect. behavior is normal. Judgment and thought content normal.  Recent Results (from the past 2160 hour(s))  ALT     Status: None   Collection Time: 08/23/16  8:12 AM  Result Value Ref Range   ALT 15 6 - 29 U/L  Lipid panel     Status: Abnormal   Collection Time: 08/23/16  8:12 AM  Result Value Ref Range   Cholesterol 168 <200 mg/dL   Triglycerides 55 <150 mg/dL   HDL 44 (L) >50 mg/dL   Total CHOL/HDL Ratio 3.8 <5.0 Ratio   VLDL 11 <30 mg/dL   LDL Cholesterol 113 (H) <100 mg/dL  Lipase, blood     Status: None   Collection Time: 11/11/16  8:17 AM  Result Value Ref Range   Lipase 29 11 - 51 U/L  Comprehensive metabolic panel     Status: Abnormal   Collection Time: 11/11/16  8:17 AM  Result Value Ref Range   Sodium 135 135 - 145 mmol/L   Potassium 4.1 3.5 - 5.1 mmol/L   Chloride 101 101 - 111 mmol/L   CO2 28 22 - 32 mmol/L   Glucose, Bld 105 (H) 65 - 99 mg/dL   BUN 14 6 - 20 mg/dL   Creatinine, Ser 0.65 0.44 - 1.00 mg/dL   Calcium 9.2 8.9 - 10.3 mg/dL   Total Protein 8.1 6.5 - 8.1 g/dL   Albumin 4.0 3.5 - 5.0 g/dL   AST 29 15 - 41 U/L   ALT 17 14 - 54 U/L   Alkaline Phosphatase 103 38 - 126 U/L   Total Bilirubin 0.8 0.3 - 1.2 mg/dL   GFR calc non Af Amer >60 >60 mL/min   GFR calc Af Amer >60 >60 mL/min    Comment: (NOTE) The eGFR has been calculated using the CKD EPI equation. This calculation has not been validated in all clinical situations. eGFR's persistently <60 mL/min signify possible Chronic Kidney Disease.    Anion gap 6 5 - 15  CBC     Status: Abnormal   Collection Time: 11/11/16  8:17 AM  Result Value Ref Range    WBC 5.0 3.6 - 11.0 K/uL   RBC 4.42 3.80 - 5.20 MIL/uL   Hemoglobin 14.3 12.0 - 16.0 g/dL   HCT 42.1 35.0 - 47.0 %   MCV 95.2 80.0 - 100.0 fL   MCH 32.4 26.0 - 34.0 pg   MCHC 34.0 32.0 - 36.0 g/dL   RDW 15.4 (H) 11.5 - 14.5 %   Platelets 286 150 - 440 K/uL  Urinalysis, Complete w Microscopic     Status: Abnormal   Collection Time: 11/11/16  8:17 AM  Result Value Ref Range   Color, Urine YELLOW (A) YELLOW   APPearance HAZY (A) CLEAR   Specific Gravity, Urine 1.023 1.005 - 1.030   pH 5.0 5.0 - 8.0   Glucose, UA NEGATIVE NEGATIVE mg/dL   Hgb urine dipstick SMALL (A) NEGATIVE   Bilirubin Urine NEGATIVE NEGATIVE   Ketones, ur NEGATIVE NEGATIVE mg/dL   Protein, ur NEGATIVE NEGATIVE mg/dL   Nitrite NEGATIVE NEGATIVE   Leukocytes, UA MODERATE (A) NEGATIVE   RBC / HPF 6-30 0 - 5 RBC/hpf   WBC, UA 0-5 0 - 5 WBC/hpf   Bacteria, UA NONE SEEN NONE SEEN   Squamous Epithelial / LPF 6-30 (A) NONE SEEN   Mucous PRESENT   POCT urinalysis dipstick     Status: Abnormal   Collection Time: 11/15/16  8:54 AM  Result Value Ref Range   Color, UA gold    Clarity, UA clear    Glucose, UA negative    Bilirubin, UA negative    Ketones, UA 15    Spec Grav, UA 1.015 1.010 - 1.025   Blood, UA large    pH, UA 5.0 5.0 - 8.0   Protein, UA 30    Urobilinogen, UA 0.2 0.2 or 1.0 E.U./dL   Nitrite, UA negative    Leukocytes, UA Small (1+) (A) Negative  Urine Culture     Status: None   Collection Time: 11/15/16  9:11 AM  Result Value Ref Range   Organism ID, Bacteria      Single organism less than 10,000 CFU/mL isolated. These organisms,commonly found on external and internal genitalia,are considered colonizers. No further testing performed.   POCT urinalysis dipstick     Status: Abnormal   Collection Time: 11/18/16  8:12 AM  Result Value Ref Range   Color, UA gold    Clarity, UA clear    Glucose, UA negative    Bilirubin, UA negative    Ketones, UA small    Spec Grav, UA 1.020 1.010 - 1.025    Blood, UA trace    pH, UA 5.0 5.0 - 8.0   Protein, UA trace    Urobilinogen, UA 1.0 0.2 or 1.0 E.U./dL   Nitrite, UA negative    Leukocytes, UA Trace (A) Negative     Assessment & Plan  1. Hematuria, unspecified type - POCT urinalysis dipstick - Ambulatory referral to Urology  2. Acute left flank pain - Ambulatory referral to Urology - meloxicam (MOBIC) 15 MG tablet; Take 1 tablet (15 mg total) by mouth daily.  Dispense: 30 tablet; Refill: 0  3. Left groin pain - meloxicam (MOBIC) 15 MG tablet; Take 1 tablet (15 mg total) by mouth daily.  Dispense: 30 tablet; Refill: 0  4. Recent change in frequency of bowel movements Miralax PRN  -Red flags and when to present for emergency care or RTC including fever >101.67F, chest pain, shortness of breath, new/worsening/un-resolving symptoms, vomiting, frank blood in urine, increased pain unrelieved by medications provided reviewed with patient at time of visit. Follow up and care instructions discussed and provided in AVS.  I have reviewed this encounter including the documentation in this note and/or discussed this patient with the Johney Maine, FNP, NP-C. I am certifying that I agree with the content of this note as supervising physician.  Steele Sizer, MD [facility] 11/29/2016, 4:23 PM

## 2016-11-22 ENCOUNTER — Ambulatory Visit: Payer: Medicare Other | Admitting: Urology

## 2016-11-22 ENCOUNTER — Encounter: Payer: Self-pay | Admitting: Urology

## 2016-11-22 NOTE — Addendum Note (Signed)
Addended by: Steele Sizer F on: 11/22/2016 08:02 AM   Modules accepted: Level of Service

## 2016-12-23 ENCOUNTER — Ambulatory Visit: Payer: Medicare Other | Admitting: Urology

## 2017-01-27 ENCOUNTER — Encounter: Payer: Self-pay | Admitting: Urology

## 2017-01-27 ENCOUNTER — Ambulatory Visit: Payer: Medicare Other | Admitting: Urology

## 2017-01-27 VITALS — BP 133/58 | HR 66 | Ht 68.0 in | Wt 151.5 lb

## 2017-01-27 DIAGNOSIS — R109 Unspecified abdominal pain: Secondary | ICD-10-CM | POA: Diagnosis not present

## 2017-01-27 LAB — URINALYSIS, COMPLETE
Bilirubin, UA: NEGATIVE
Glucose, UA: NEGATIVE
KETONES UA: NEGATIVE
NITRITE UA: NEGATIVE
Protein, UA: NEGATIVE
SPEC GRAV UA: 1.015 (ref 1.005–1.030)
Urobilinogen, Ur: 0.2 mg/dL (ref 0.2–1.0)
pH, UA: 6 (ref 5.0–7.5)

## 2017-01-27 LAB — MICROSCOPIC EXAMINATION: RBC MICROSCOPIC, UA: NONE SEEN /HPF (ref 0–?)

## 2017-01-27 NOTE — Progress Notes (Signed)
01/27/2017 1:23 PM   Julie Beck 12/14/1945 [id number]  Referring provider: Arnetha Courser, MD [address] [place], [place] [address]  Chief Complaint  Patient presents with  . Flank Pain    HPI: The patient is a 71 year old female presents today for evaluation of left lower quadrant pain. She was seen in the emergency department for this in late June 2018. A CT scan was done that time which showed no genitourinary abnormalities. She had no hydronephrosis. She had no evidence of nephrolithiasis. She did microscopic hematuria at that time, so she was presumed to have passed a stone prior to having a CT scan performed. The patient localizes her pain to the lower portion of her left lower quadrant. It does radiate towards her epigastric area and crosses midline. She has had this intermittently since she was seen in the ER in late June with a CT was performed. The last time she experienced this pain was a few days ago. Nothing makes it better or worse. She has no previous history of nephrolithiasis. She has no dysuria. She has no history of gross hematuria. She has no prior stone history.   PMH: Past Medical History:  Diagnosis Date  . Abnormal CT of liver 07/12/2016   Chest CT Feb 2018  . Arthritis   . Centrilobular emphysema ([place]) 07/12/2016   Chest CT Feb 2018  . Coronary artery disease 07/12/2016   Followed by Dr. Saunders Revel, seen on chest CT Feb 2018  . Hyperlipidemia   . Overweight (BMI 25.0-29.9) 03/17/2016  . Thoracic aortic atherosclerosis ([place]) 07/12/2016   Chest CT scan Feb 2018  . Tobacco abuse 03/17/2016    Surgical History: Past Surgical History:  Procedure Laterality Date  . INGUINAL HERNIA REPAIR Bilateral   . KNEE ARTHROSCOPY Bilateral   . TUBAL LIGATION      Home Medications:  Allergies as of 01/27/2017      Reactions   Sulfa Antibiotics Hives      Medication List       Accurate as of 01/27/17  1:23 PM. Always use your most recent med list.          aspirin EC 81 MG tablet Take 1 tablet (81 mg total) by mouth daily.   atorvastatin 40 MG tablet Commonly known as:  LIPITOR Take 1 tablet (40 mg total) by mouth at bedtime.   calcium-vitamin D 500-200 MG-UNIT tablet Commonly known as:  OSCAL WITH D Take 1 tablet by mouth every morning.   meloxicam 15 MG tablet Commonly known as:  MOBIC Take 1 tablet (15 mg total) by mouth daily.   multivitamin tablet Take 1 tablet by mouth daily.   oxyCODONE-acetaminophen 5-325 MG tablet Commonly known as:  ROXICET Take 1 tablet by mouth every 4 (four) hours as needed for severe pain.   polyethylene glycol powder powder Commonly known as:  GLYCOLAX/MIRALAX Take 17 g by mouth daily.            Discharge Care Instructions        Start     Ordered   01/27/17 0000  Urinalysis, Complete     01/27/17 1250      Allergies:  Allergies  Allergen Reactions  . Sulfa Antibiotics Hives    Family History: Family History  Problem Relation Age of Onset  . Alzheimer's disease Mother   . Breast cancer Mother   . Cancer Father        lung  . Dementia Sister   .  Breast cancer Paternal Aunt   . Cancer Maternal Grandmother        breast  . Heart attack Maternal Grandfather   . Bladder Cancer Neg Hx   . Kidney cancer Neg Hx     Social History:  reports that she has been smoking Cigarettes.  She has a 33.80 pack-year smoking history. She has never used smokeless tobacco. She reports that she drinks about 0.6 oz of alcohol per week . She reports that she does not use drugs.  ROS: UROLOGY Frequent Urination?: No Hard to postpone urination?: No Burning/pain with urination?: No Get up at night to urinate?: Yes Leakage of urine?: No Urine stream starts and stops?: No Trouble starting stream?: No Do you have to strain to urinate?: No Blood in urine?: No Urinary tract infection?: No Sexually transmitted disease?: No Injury to kidneys or bladder?: No Painful intercourse?: No Weak  stream?: No Currently pregnant?: No Vaginal bleeding?: No Last menstrual period?: n  Gastrointestinal Nausea?: No Vomiting?: No Indigestion/heartburn?: Yes Diarrhea?: No Constipation?: Yes  Constitutional Fever: No Night sweats?: Yes Weight loss?: No Fatigue?: No  Skin Skin rash/lesions?: Yes Itching?: Yes  Eyes Blurred vision?: No Double vision?: No  Ears/Nose/Throat Sore throat?: No Sinus problems?: No  Hematologic/Lymphatic Swollen glands?: No Easy bruising?: No  Cardiovascular Leg swelling?: No Chest pain?: No  Respiratory Cough?: No Shortness of breath?: No  Endocrine Excessive thirst?: No  Musculoskeletal Back pain?: No Joint pain?: No  Neurological Headaches?: No Dizziness?: No  Psychologic Depression?: No Anxiety?: No  Physical Exam: BP (!) 133/58 (BP Location: Left Arm, Patient Position: Sitting, Cuff Size: Normal)   Pulse 66   Ht 5\' 8"  (1.727 m)   Wt 151 lb 8 oz (68.7 kg)   BMI 23.04 kg/m   Constitutional:  Alert and oriented, No acute distress. HEENT: [place] AT, moist mucus membranes.  Trachea midline, no masses. Cardiovascular: No clubbing, cyanosis, or edema. Respiratory: Normal respiratory effort, no increased work of breathing. GI: Abdomen is soft, nontender, nondistended, no abdominal masses GU: No CVA tenderness.  Skin: No rashes, bruises or suspicious lesions. Lymph: No cervical or inguinal adenopathy. Neurologic: Grossly intact, no focal deficits, moving all 4 extremities. Psychiatric: Normal mood and affect.  Laboratory Data: Lab Results  Component Value Date   WBC 5.0 11/11/2016   HGB 14.3 11/11/2016   HCT 42.1 11/11/2016   MCV 95.2 11/11/2016   PLT 286 11/11/2016    Lab Results  Component Value Date   CREATININE 0.65 11/11/2016    No results found for: PSA  No results found for: TESTOSTERONE  No results found for: HGBA1C  Urinalysis    Component Value Date/Time   COLORURINE YELLOW (A) 11/11/2016 0817     APPEARANCEUR HAZY (A) 11/11/2016 0817   LABSPEC 1.023 11/11/2016 0817   PHURINE 5.0 11/11/2016 0817   GLUCOSEU NEGATIVE 11/11/2016 0817   HGBUR SMALL (A) 11/11/2016 0817   BILIRUBINUR negative 11/18/2016 0812   KETONESUR NEGATIVE 11/11/2016 0817   PROTEINUR trace 11/18/2016 0812   PROTEINUR NEGATIVE 11/11/2016 0817   UROBILINOGEN 1.0 11/18/2016 0812   NITRITE negative 11/18/2016 0812   NITRITE NEGATIVE 11/11/2016 0817   LEUKOCYTESUR Trace (A) 11/18/2016 0812    Pertinent Imaging: CT reviewed as above  Assessment & Plan:    1. Left lower quadrant pain I discussed with the patient that she had no hydronephrosis or nephrolithiasis on her CT when she first started experiencing her pain. The location of her pain and where it radiates also  is not classic for obstructive uropathy. Though it is possible she may have passed a stone in late June prior to the CT as she had microscopic hematuria at that time, her symptoms at this time would not be related to nephrolithiasis or obstructive uropathy. Her micoscopic hematuria has resolved at this point, so I do not think she needs any further workup for this. At this point, her pain is not urological in origin. I have advised her to follow up with her primary care provider for further workup.  Return if symptoms worsen or fail to improve.  Nickie Retort, MD  [facility] [address] [place], [place] [address] [phone]

## 2017-05-30 ENCOUNTER — Other Ambulatory Visit: Payer: Self-pay | Admitting: Family Medicine

## 2017-05-30 DIAGNOSIS — Z1231 Encounter for screening mammogram for malignant neoplasm of breast: Secondary | ICD-10-CM

## 2017-06-08 ENCOUNTER — Telehealth: Payer: Self-pay | Admitting: *Deleted

## 2017-06-08 DIAGNOSIS — Z87891 Personal history of nicotine dependence: Secondary | ICD-10-CM

## 2017-06-08 DIAGNOSIS — Z122 Encounter for screening for malignant neoplasm of respiratory organs: Secondary | ICD-10-CM

## 2017-06-08 NOTE — Telephone Encounter (Signed)
Notified patient that annual lung cancer screening low dose CT scan is due currently or will be in near future. Confirmed that patient is within the age range of 55-77, and asymptomatic, (no signs or symptoms of lung cancer). Patient denies illness that would prevent curative treatment for lung cancer if found. Verified smoking history, (current, 34 pack year). The shared decision making visit was done 06/09/16. Patient is agreeable for CT scan being scheduled.

## 2017-06-08 NOTE — Telephone Encounter (Signed)
error 

## 2017-06-15 ENCOUNTER — Ambulatory Visit
Admission: RE | Admit: 2017-06-15 | Discharge: 2017-06-15 | Disposition: A | Discharge status: Home / Self Care | Payer: Medicare Other | Source: Ambulatory Visit | Attending: Family Medicine | Admitting: Family Medicine

## 2017-06-15 DIAGNOSIS — Z1231 Encounter for screening mammogram for malignant neoplasm of breast: Secondary | ICD-10-CM | POA: Diagnosis not present

## 2017-06-16 ENCOUNTER — Ambulatory Visit
Admission: RE | Admit: 2017-06-16 | Discharge: 2017-06-16 | Disposition: A | Discharge status: Home / Self Care | Payer: Medicare Other | Source: Ambulatory Visit | Attending: Nurse Practitioner | Admitting: Nurse Practitioner

## 2017-06-16 DIAGNOSIS — R918 Other nonspecific abnormal finding of lung field: Secondary | ICD-10-CM | POA: Insufficient documentation

## 2017-06-16 DIAGNOSIS — Z87891 Personal history of nicotine dependence: Secondary | ICD-10-CM

## 2017-06-16 DIAGNOSIS — I2510 Atherosclerotic heart disease of native coronary artery without angina pectoris: Secondary | ICD-10-CM | POA: Insufficient documentation

## 2017-06-16 DIAGNOSIS — F17210 Nicotine dependence, cigarettes, uncomplicated: Secondary | ICD-10-CM | POA: Diagnosis not present

## 2017-06-16 DIAGNOSIS — J432 Centrilobular emphysema: Secondary | ICD-10-CM | POA: Insufficient documentation

## 2017-06-16 DIAGNOSIS — I700 Atherosclerosis of aorta: Secondary | ICD-10-CM | POA: Diagnosis not present

## 2017-06-16 DIAGNOSIS — Z122 Encounter for screening for malignant neoplasm of respiratory organs: Secondary | ICD-10-CM | POA: Diagnosis not present

## 2017-06-16 DIAGNOSIS — J438 Other emphysema: Secondary | ICD-10-CM | POA: Insufficient documentation

## 2017-06-20 ENCOUNTER — Encounter: Payer: Self-pay | Admitting: *Deleted

## 2017-08-07 ENCOUNTER — Other Ambulatory Visit: Payer: Self-pay | Admitting: Family Medicine

## 2017-08-08 NOTE — Telephone Encounter (Signed)
I have not seen patient in over a year Last lipid panel April 2018 Please ask her to schedule an appt and I'll send one more 30 day supply We look forward to seeing her soon Thank you

## 2017-08-08 NOTE — Telephone Encounter (Signed)
Left detailed voicemail

## 2017-10-06 ENCOUNTER — Ambulatory Visit (INDEPENDENT_AMBULATORY_CARE_PROVIDER_SITE_OTHER): Payer: Medicare Other

## 2017-10-06 VITALS — BP 138/60 | HR 62 | Temp 97.4°F | Resp 12 | Ht 68.0 in | Wt 148.2 lb

## 2017-10-06 DIAGNOSIS — Z0000 Encounter for general adult medical examination without abnormal findings: Secondary | ICD-10-CM

## 2017-10-06 NOTE — Patient Instructions (Signed)
Julie Beck , Thank you for taking time to come for your Medicare Wellness Visit. I appreciate your ongoing commitment to your health goals. Please review the following plan we discussed and let me know if I can assist you in the future.   Screening recommendations/referrals: Colorectal Screening: Completed 05/17/13. Repeat every 10 years Mammogram: Completed 06/16/17. Repeat every year Bone Density: Completed 04/22/14. Osteoporotic screenings no longer required Lung Cancer Screening: You do not qualify for this screening Hepatitis C Screening: Completed 07/12/16  Vision and Dental Exams: Recommended annual ophthalmology exams for early detection of glaucoma and other disorders of the eye Recommended annual dental exams for proper oral hygiene  Vaccinations: Influenza vaccine: Overdue Pneumococcal vaccine: Declined Tdap vaccine: Up to date Shingles vaccine: Please call your insurance company to determine your out of pocket expense for the Shingrix vaccine. You may also receive this vaccine at your local pharmacy or Health Dept.   Advanced directives: Advance directive discussed with you today. I have provided a copy for you to complete at home and have notarized. Once this is complete please bring a copy in to our office so we can scan it into your chart.  Please bring a copy of your Living Will to your next appointment.  Conditions/risks identified: Recommend to drink at least 6-8 8oz glasses of water per day.  Next appointment: Please schedule your Annual Wellness Visit with your Nurse Health Advisor in one year.  Preventive Care 37 Years and Older, Female Preventive care refers to lifestyle choices and visits with your health care provider that can promote health and wellness. What does preventive care include?  A yearly physical exam. This is also called an annual well check.  Dental exams once or twice a year.  Routine eye exams. Ask your health care provider how often you should  have your eyes checked.  Personal lifestyle choices, including:  Daily care of your teeth and gums.  Regular physical activity.  Eating a healthy diet.  Avoiding tobacco and drug use.  Limiting alcohol use.  Practicing safe sex.  Taking low-dose aspirin every day.  Taking vitamin and mineral supplements as recommended by your health care provider. What happens during an annual well check? The services and screenings done by your health care provider during your annual well check will depend on your age, overall health, lifestyle risk factors, and family history of disease. Counseling  Your health care provider may ask you questions about your:  Alcohol use.  Tobacco use.  Drug use.  Emotional well-being.  Home and relationship well-being.  Sexual activity.  Eating habits.  History of falls.  Memory and ability to understand (cognition).  Work and work Statistician.  Reproductive health. Screening  You may have the following tests or measurements:  Height, weight, and BMI.  Blood pressure.  Lipid and cholesterol levels. These may be checked every 5 years, or more frequently if you are over 45 years old.  Skin check.  Lung cancer screening. You may have this screening every year starting at age 26 if you have a 30-pack-year history of smoking and currently smoke or have quit within the past 15 years.  Fecal occult blood test (FOBT) of the stool. You may have this test every year starting at age 74.  Flexible sigmoidoscopy or colonoscopy. You may have a sigmoidoscopy every 5 years or a colonoscopy every 10 years starting at age 49.  Hepatitis C blood test.  Hepatitis B blood test.  Sexually transmitted disease (STD) testing.  Diabetes screening. This is done by checking your blood sugar (glucose) after you have not eaten for a while (fasting). You may have this done every 1-3 years.  Bone density scan. This is done to screen for osteoporosis. You may  have this done starting at age 58.  Mammogram. This may be done every 1-2 years. Talk to your health care provider about how often you should have regular mammograms. Talk with your health care provider about your test results, treatment options, and if necessary, the need for more tests. Vaccines  Your health care provider may recommend certain vaccines, such as:  Influenza vaccine. This is recommended every year.  Tetanus, diphtheria, and acellular pertussis (Tdap, Td) vaccine. You may need a Td booster every 10 years.  Zoster vaccine. You may need this after age 60.  Pneumococcal 13-valent conjugate (PCV13) vaccine. One dose is recommended after age 9.  Pneumococcal polysaccharide (PPSV23) vaccine. One dose is recommended after age 56. Talk to your health care provider about which screenings and vaccines you need and how often you need them. This information is not intended to replace advice given to you by your health care provider. Make sure you discuss any questions you have with your health care provider. Document Released: 05/30/2015 Document Revised: 01/21/2016 Document Reviewed: 03/04/2015 Elsevier Interactive Patient Education  2017 [place] Prevention in the Home Falls can cause injuries. They can happen to people of all ages. There are many things you can do to make your home safe and to help prevent falls. What can I do on the outside of my home?  Regularly fix the edges of walkways and driveways and fix any cracks.  Remove anything that might make you trip as you walk through a door, such as a raised step or threshold.  Trim any bushes or trees on the path to your home.  Use bright outdoor lighting.  Clear any walking paths of anything that might make someone trip, such as rocks or tools.  Regularly check to see if handrails are loose or broken. Make sure that both sides of any steps have handrails.  Any raised decks and porches should have guardrails  on the edges.  Have any leaves, snow, or ice cleared regularly.  Use sand or salt on walking paths during winter.  Clean up any spills in your garage right away. This includes oil or grease spills. What can I do in the bathroom?  Use night lights.  Install grab bars by the toilet and in the tub and shower. Do not use towel bars as grab bars.  Use non-skid mats or decals in the tub or shower.  If you need to sit down in the shower, use a plastic, non-slip stool.  Keep the floor dry. Clean up any water that spills on the floor as soon as it happens.  Remove soap buildup in the tub or shower regularly.  Attach bath mats securely with double-sided non-slip rug tape.  Do not have throw rugs and other things on the floor that can make you trip. What can I do in the bedroom?  Use night lights.  Make sure that you have a light by your bed that is easy to reach.  Do not use any sheets or blankets that are too big for your bed. They should not hang down onto the floor.  Have a firm chair that has side arms. You can use this for support while you get dressed.  Do not have throw  rugs and other things on the floor that can make you trip. What can I do in the kitchen?  Clean up any spills right away.  Avoid walking on wet floors.  Keep items that you use a lot in easy-to-reach places.  If you need to reach something above you, use a strong step stool that has a grab bar.  Keep electrical cords out of the way.  Do not use floor polish or wax that makes floors slippery. If you must use wax, use non-skid floor wax.  Do not have throw rugs and other things on the floor that can make you trip. What can I do with my stairs?  Do not leave any items on the stairs.  Make sure that there are handrails on both sides of the stairs and use them. Fix handrails that are broken or loose. Make sure that handrails are as long as the stairways.  Check any carpeting to make sure that it is  firmly attached to the stairs. Fix any carpet that is loose or worn.  Avoid having throw rugs at the top or bottom of the stairs. If you do have throw rugs, attach them to the floor with carpet tape.  Make sure that you have a light switch at the top of the stairs and the bottom of the stairs. If you do not have them, ask someone to add them for you. What else can I do to help prevent falls?  Wear shoes that:  Do not have high heels.  Have rubber bottoms.  Are comfortable and fit you well.  Are closed at the toe. Do not wear sandals.  If you use a stepladder:  Make sure that it is fully opened. Do not climb a closed stepladder.  Make sure that both sides of the stepladder are locked into place.  Ask someone to hold it for you, if possible.  Clearly mark and make sure that you can see:  Any grab bars or handrails.  First and last steps.  Where the edge of each step is.  Use tools that help you move around (mobility aids) if they are needed. These include:  Canes.  Walkers.  Scooters.  Crutches.  Turn on the lights when you go into a dark area. Replace any light bulbs as soon as they burn out.  Set up your furniture so you have a clear path. Avoid moving your furniture around.  If any of your floors are uneven, fix them.  If there are any pets around you, be aware of where they are.  Review your medicines with your doctor. Some medicines can make you feel dizzy. This can increase your chance of falling. Ask your doctor what other things that you can do to help prevent falls. This information is not intended to replace advice given to you by your health care provider. Make sure you discuss any questions you have with your health care provider. Document Released: 02/27/2009 Document Revised: 10/09/2015 Document Reviewed: 06/07/2014 Elsevier Interactive Patient Education  2017 [place] with Quitting Smoking Quitting smoking is a physical and mental  challenge. You will face cravings, withdrawal symptoms, and temptation. Before quitting, work with your health care provider to make a plan that can help you cope. Preparation can help you quit and keep you from giving in. How can I cope with cravings? Cravings usually last for 5-10 minutes. If you get through it, the craving will pass. Consider taking the following actions to help  you cope with cravings:  Keep your mouth busy: ? Chew sugar-free gum. ? Suck on hard candies or a straw. ? Brush your teeth.  Keep your hands and body busy: ? Immediately change to a different activity when you feel a craving. ? Squeeze or play with a ball. ? Do an activity or a hobby, like making bead jewelry, practicing needlepoint, or working with wood. ? Mix up your normal routine. ? Take a short exercise break. Go for a quick walk or run up and down stairs. ? Spend time in public places where smoking is not allowed.  Focus on doing something kind or helpful for someone else.  Call a friend or family member to talk during a craving.  Join a support group.  Call a quit line, such as 1-800-QUIT-NOW.  Talk with your health care provider about medicines that might help you cope with cravings and make quitting easier for you.  How can I deal with withdrawal symptoms? Your body may experience negative effects as it tries to get used to not having nicotine in the system. These effects are called withdrawal symptoms. They may include:  Feeling hungrier than normal.  Trouble concentrating.  Irritability.  Trouble sleeping.  Feeling depressed.  Restlessness and agitation.  Craving a cigarette.  To manage withdrawal symptoms:  Avoid places, people, and activities that trigger your cravings.  Remember why you want to quit.  Get plenty of sleep.  Avoid coffee and other caffeinated drinks. These may worsen some of your symptoms.  How can I handle social situations? Social situations can be  difficult when you are quitting smoking, especially in the first few weeks. To manage this, you can:  Avoid parties, bars, and other social situations where people might be smoking.  Avoid alcohol.  Leave right away if you have the urge to smoke.  Explain to your family and friends that you are quitting smoking. Ask for understanding and support.  Plan activities with friends or family where smoking is not an option.  What are some ways I can cope with stress? Wanting to smoke may cause stress, and stress can make you want to smoke. Find ways to manage your stress. Relaxation techniques can help. For example:  Breathe slowly and deeply, in through your nose and out through your mouth.  Listen to soothing, relaxing music.  Talk with a family member or friend about your stress.  Light a candle.  Soak in a bath or take a shower.  Think about a peaceful place.  What are some ways I can prevent weight gain? Be aware that many people gain weight after they quit smoking. However, not everyone does. To keep from gaining weight, have a plan in place before you quit and stick to the plan after you quit. Your plan should include:  Having healthy snacks. When you have a craving, it may help to: ? Eat plain popcorn, crunchy carrots, celery, or other cut vegetables. ? Chew sugar-free gum.  Changing how you eat: ? Eat small portion sizes at meals. ? Eat 4-6 small meals throughout the day instead of 1-2 large meals a day. ? Be mindful when you eat. Do not watch television or do other things that might distract you as you eat.  Exercising regularly: ? Make time to exercise each day. If you do not have time for a long workout, do short bouts of exercise for 5-10 minutes several times a day. ? Do some form of strengthening exercise, like weight  lifting, and some form of aerobic exercise, like running or swimming.  Drinking plenty of water or other low-calorie or no-calorie drinks. Drink 6-8  glasses of water daily, or as much as instructed by your health care provider.  Summary  Quitting smoking is a physical and mental challenge. You will face cravings, withdrawal symptoms, and temptation to smoke again. Preparation can help you as you go through these challenges.  You can cope with cravings by keeping your mouth busy (such as by chewing gum), keeping your body and hands busy, and making calls to family, friends, or a helpline for people who want to quit smoking.  You can cope with withdrawal symptoms by avoiding places where people smoke, avoiding drinks with caffeine, and getting plenty of rest.  Ask your health care provider about the different ways to prevent weight gain, avoid stress, and handle social situations. This information is not intended to replace advice given to you by your health care provider. Make sure you discuss any questions you have with your health care provider. Document Released: 04/30/2016 Document Revised: 04/30/2016 Document Reviewed: 04/30/2016 Elsevier Interactive Patient Education  [employer].

## 2017-10-06 NOTE — Progress Notes (Signed)
Subjective:   Julie Beck is a 72 y.o. female who presents for Medicare Annual (Subsequent) preventive examination.  Review of Systems:  N/A Cardiac Risk Factors include: advanced age (>1men, >61 women);dyslipidemia;sedentary lifestyle;smoking/ tobacco exposure;hypertension     Objective:     Vitals: BP 138/60 (BP Location: Left Arm, Patient Position: Sitting, Cuff Size: Normal)   Pulse 62   Temp (!) 97.4 F (36.3 C) (Oral)   Resp 12   Ht 5\' 8"  (1.727 m)   Wt 148 lb 3.2 oz (67.2 kg)   SpO2 97%   BMI 22.53 kg/m   Body mass index is 22.53 kg/m.  Advanced Directives 10/06/2017 11/18/2016 11/15/2016 11/11/2016 07/12/2016 05/12/2016 03/17/2016  Does Patient Have a Medical Advance Directive? Yes No No No Yes Yes Yes  Type of Advance Directive Living will - - - - Living will Living will  Does patient want to make changes to medical advance directive? Yes (MAU/Ambulatory/Procedural Areas - Information given) - - - - - No - Patient declined  Copy of [place] in Chart? - - - - - No - copy requested No - copy requested    Tobacco Social History   Tobacco Use  Smoking Status Current Some Day Smoker  . Packs/day: 0.25  . Years: 52.00  . Pack years: 13.00  . Types: Cigarettes  Smokeless Tobacco Never Used     Ready to quit: Yes Counseling given: Yes  Clinical Intake:  Pre-visit preparation completed: Yes  Pain : No/denies pain   BMI - recorded: 22.53 Nutritional Status: BMI of 19-24  Normal Nutritional Risks: None Diabetes: No  How often do you need to have someone help you when you read instructions, pamphlets, or other written materials from your doctor or pharmacy?: 1 - Never  Interpreter Needed?: No  Information entered by :: AEVersole, LPN  Hospitalizations/ED visits and surgeries occurring within the previous 12 months:  Within the previous 12 months, pt has not underwent any surgical procedures, has not been hospitalized for any conditions.  However, pt was seen @ [facility] ED on 11/11/16 for L flank pain, treated by Dr. Archie Beck. Pt followed up with Julie Ensign, NP on 11/15/16. Julie Beck's plan included the following: Assessment & Plan  1. Cystitis with hematuria - nitrofurantoin, macrocrystal-monohydrate, (MACROBID) 100 MG capsule; Take 1 capsule (100 mg total) by mouth 2 (two) times daily.  Dispense: 10 capsule; Refill: 0  2. Pyuria - Urine Culture - POCT urinalysis dipstick - - POCT urinalysis dipstick - Lg amount hgb and small amount of leukocytes with Ketones 15.   3. Other microscopic hematuria - POCT urinalysis dipstick  4. Acute left flank pain - diclofenac (VOLTAREN) 75 MG EC tablet; Take 1 tablet (75 mg total) by mouth 2 (two) times daily.  Dispense: 15 tablet; Refill: 0 - POCT urinalysis dipstick  5. Recent change in frequency of bowel movements - polyethylene glycol powder (GLYCOLAX/MIRALAX) powder; Take 17 g by mouth daily.  Dispense: 3350 g; Refill: 1  -Due to patient's significant pain and ongoing symptoms, close follow up in 3 days is advised.   Past Medical History:  Diagnosis Date  . Abnormal CT of liver 07/12/2016   Chest CT Feb 2018  . Arthritis   . Centrilobular emphysema ([place]) 07/12/2016   Chest CT Feb 2018  . Coronary artery disease 07/12/2016   Followed by Dr. Saunders Revel, seen on chest CT Feb 2018  . Hyperlipidemia   . Overweight (BMI 25.0-29.9) 03/17/2016  . Thoracic aortic atherosclerosis (  [place]) 07/12/2016   Chest CT scan Feb 2018  . Tobacco abuse 03/17/2016   Past Surgical History:  Procedure Laterality Date  . INGUINAL HERNIA REPAIR Bilateral   . KNEE ARTHROSCOPY Bilateral   . TUBAL LIGATION     Family History  Problem Relation Age of Onset  . Alzheimer's disease Mother   . Breast cancer Mother   . Cancer Father        lung  . Dementia Sister   . Cancer Maternal Grandmother        breast  . Breast cancer Maternal Grandmother   . Heart attack Maternal Grandfather   . Healthy Sister   . Breast  cancer Maternal Aunt   . Bladder Cancer Neg Hx   . Kidney cancer Neg Hx    Social History   Socioeconomic History  . Marital status: Divorced    Spouse name: Not on file  . Number of children: 2  . Years of education: some college  . Highest education level: 12th grade  Occupational History    Employer: OTHER  Social Needs  . Financial resource strain: Not hard at all  . Food insecurity:    Worry: Never true    Inability: Never true  . Transportation needs:    Medical: No    Non-medical: No  Tobacco Use  . Smoking status: Current Some Day Smoker    Packs/day: 0.25    Years: 52.00    Pack years: 13.00    Types: Cigarettes  . Smokeless tobacco: Never Used  Substance and Sexual Activity  . Alcohol use: Not Currently    Comment: socially  . Drug use: No  . Sexual activity: Not Currently    Partners: Female  Lifestyle  . Physical activity:    Days per week: 0 days    Minutes per session: 0 min  . Stress: Not at all  Relationships  . Social connections:    Talks on phone: Patient refused    Gets together: Patient refused    Attends religious service: Patient refused    Active member of club or organization: Patient refused    Attends meetings of clubs or organizations: Patient refused    Relationship status: Divorced  Other Topics Concern  . Not on file  Social History Narrative  . Not on file    Outpatient Encounter Medications as of 10/06/2017  Medication Sig  . aspirin EC 81 MG tablet Take 1 tablet (81 mg total) by mouth daily.  Marland Kitchen aspirin EC 81 MG tablet Take by mouth.  Marland Kitchen atorvastatin (LIPITOR) 40 MG tablet TAKE 1 TABLET(40 MG) BY MOUTH AT BEDTIME  . atorvastatin (LIPITOR) 40 MG tablet Take by mouth.  . calcium-vitamin D (OSCAL WITH D) 500-200 MG-UNIT tablet Take 1 tablet by mouth every morning.  . Multiple Vitamin (MULTIVITAMIN) tablet Take 1 tablet by mouth daily.  Marland Kitchen omeprazole (PRILOSEC) 20 MG capsule TAKE 1 CAPSULE BY MOUTH DAILY  . triamcinolone  ointment (KENALOG) 0.1 % Apply topically.  . Calcium Citrate-Vitamin D (CALCIUM CITRATE + D3) 200-250 MG-UNIT TABS Take by mouth.  . meloxicam (MOBIC) 15 MG tablet Take 1 tablet (15 mg total) by mouth daily. (Patient not taking: Reported on 01/27/2017)  . NON FORMULARY Take by mouth.  . oxyCODONE-acetaminophen (ROXICET) 5-325 MG tablet Take 1 tablet by mouth every 4 (four) hours as needed for severe pain. (Patient not taking: Reported on 01/27/2017)  . polyethylene glycol powder (GLYCOLAX/MIRALAX) powder Take 17 g by mouth daily. (Patient not  taking: Reported on 01/27/2017)   No facility-administered encounter medications on file as of 10/06/2017.     Activities of Daily Living In your present state of health, do you have any difficulty performing the following activities: 10/06/2017  Hearing? N  Comment denies hearing aids  Vision? N  Comment wears eyeglasses  Difficulty concentrating or making decisions? N  Walking or climbing stairs? N  Dressing or bathing? N  Doing errands, shopping? N  Preparing Food and eating ? N  Comment denies dentures  Using the Toilet? N  In the past six months, have you accidently leaked urine? N  Do you have problems with loss of bowel control? N  Managing your Medications? N  Managing your Finances? N  Housekeeping or managing your Housekeeping? N  Some recent data might be hidden    Patient Care Team: Lada, Satira Anis, MD as PCP - General (Family Medicine)    Assessment:   This is a routine wellness examination for Kimra.  Exercise Activities and Dietary recommendations Current Exercise Habits: The patient does not participate in regular exercise at present, Exercise limited by: None identified  Goals    . DIET - INCREASE WATER INTAKE     Recommend to drink at least 6-8 8oz glasses of water per day.       Fall Risk Fall Risk  10/06/2017 07/12/2016 05/12/2016 03/17/2016  Falls in the past year? No No No No  Risk for fall due to : Impaired  vision - - -  Risk for fall due to: Comment wears eyeglasses - - -   FALL RISK PREVENTION PERTAINING TO HOME: Is your home free of loose throw rugs in walkways, pet beds, electrical cords, etc? Yes Is there adequate lighting in your home to reduce risk of falls?  Yes Are there stairs in or around your home WITH handrails? Yes  ASSISTIVE DEVICES UTILIZED TO PREVENT FALLS: Use of a cane, walker or w/c? No Grab bars in the bathroom? Yes  Shower chair or a place to sit while bathing? Yes An elevated toilet seat or a handicapped toilet? Yes  Timed Get Up and Go Performed: Yes. Pt ambulated 10 feet within 5 sec. Gait stead-fast and without the use of an assistive device. No intervention required at this time. Fall risk prevention has been discussed.  Community Resource Referral:  [place] Referral not required at this time.   Depression Screen PHQ 2/9 Scores 10/06/2017 07/12/2016 05/12/2016 03/17/2016  PHQ - 2 Score 0 0 0 0  PHQ- 9 Score 0 - - -     Cognitive Function     6CIT Screen 10/06/2017  What Year? 0 points  What month? 0 points  What time? 0 points  Count back from 20 0 points  Months in reverse 0 points  Repeat phrase 0 points  Total Score 0    Immunization History  Administered Date(s) Administered  . Influenza-Unspecified 06/23/2014  . Pneumococcal Conjugate-13 05/29/2014  . Tdap 06/23/2014    Qualifies for Shingles Vaccine? Yes. Due for Shingrix. Education has been provided regarding the importance of this vaccine. Pt has been advised to call her insurance company to determine her out of pocket expense. Advised she may also receive this vaccine at her local pharmacy or Health Dept. Verbalized acceptance and understanding.  Overdue for Flu vaccine. Education has been provided regarding the importance of this vaccine and advised to receive when available. Verbalized acceptance and understanding.  Due for Pneumoccocal vaccine. Declined my offer to  administer today. Education has been provided regarding the importance of this vaccine but still declined. Pt has been advised to call our office if she should change her mind and wish to receive this vaccine. Also advised she may receive this vaccine at her local pharmacy or Health Dept. Pt is aware to provide a copy of her vaccination record if she chooses to receive this vaccine at her local pharmacy. Verbalized acceptance and understanding.  Screening Tests Health Maintenance  Topic Date Due  . PNA vac Low Risk Adult (2 of 2 - PPSV23) 10/07/2018 (Originally 05/30/2015)  . INFLUENZA VACCINE  12/15/2017  . MAMMOGRAM  06/15/2018  . COLONOSCOPY  05/18/2023  . TETANUS/TDAP  06/23/2024  . DEXA SCAN  Completed  . Hepatitis C Screening  Completed    Cancer Screenings: Lung: Low Dose CT Chest recommended if Age 37-80 years, 30 pack-year currently smoking OR have quit w/in 15years. Patient does not qualify. Breast:  Up to date on Mammogram? Yes.  Completed 06/16/17. Repeat every year  Up to date of Bone Density/Dexa? Yes. Completed 04/22/14. Osteoporotic screenings no longer required. Colorectal: Completed 05/17/13. Repeat every 10 years  Additional Screenings: Hepatitis C Screening: Completed 07/12/16    Plan:  I have personally reviewed and addressed the Medicare Annual Wellness questionnaire and have noted the following in the patient's chart:  A. Medical and social history B. Use of alcohol, tobacco or illicit drugs  C. Current medications and supplements D. Functional ability and status E.  Nutritional status F.  Physical activity G. Advance directives H. List of other physicians I.  Hospitalizations, surgeries, and ER visits in previous 12 months J.  [place] such as hearing and vision if needed, cognitive and depression L. Referrals and appointments  In addition, I have reviewed and discussed with patient certain preventive protocols, quality metrics, and best practice  recommendations. A written personalized care plan for preventive services as well as general preventive health recommendations were provided to patient.  See attached scanned questionnaire for additional information.   Signed,  Aleatha Borer, LPN Nurse Health Advisor

## 2017-10-11 ENCOUNTER — Telehealth: Payer: Self-pay | Admitting: Family Medicine

## 2017-10-11 ENCOUNTER — Other Ambulatory Visit: Payer: Self-pay | Admitting: Family Medicine

## 2017-10-11 MED ORDER — ATORVASTATIN CALCIUM 40 MG PO TABS: ORAL_TABLET | ORAL | 0 refills | Status: DC

## 2017-10-11 NOTE — Telephone Encounter (Signed)
Copied from [place] [id number]. Topic: Quick Communication - Rx Refill/Question >> Oct 11, 2017 12:27 PM Waylan Rocher, Lumin L wrote: Medication: atorvastatin (LIPITOR) 40 MG tablet  Has the patient contacted their pharmacy? Yes.   (Agent: If no, request that the patient contact the pharmacy for the refill.) (Agent: If yes, when and what did the pharmacy advise?)  Preferred Pharmacy (with phone number or street name): [facility] [place], [place] - [address] [place] [address] Phone: [phone] Fax: [phone]  Agent: Please be advised that RX refills may take up to 3 business days. We ask that you follow-up with your pharmacy.

## 2017-10-11 NOTE — Telephone Encounter (Signed)
Left another detailed voicemail that pt would needs appt before any more refills this was stated in telephone encounter on 08/08/17

## 2017-10-11 NOTE — Addendum Note (Signed)
Addended by: LADA, Satira Anis on: 10/11/2017 03:55 PM   Modules accepted: Orders

## 2017-10-11 NOTE — Telephone Encounter (Signed)
Patient has a physical on 10/27/17 at 11am. She would like to know can she get enough medication to last her to that appt. Please advise.

## 2017-10-11 NOTE — Telephone Encounter (Signed)
rx sent

## 2017-10-13 ENCOUNTER — Other Ambulatory Visit: Payer: Self-pay

## 2017-10-13 NOTE — Patient Outreach (Signed)
[facility]) Care Management  10/13/2017  NITZA SCHMID 1946/02/25 [id number]   Medication Adherence call to Mrs : Shuntavia Yerby left a message for patient to call back patient is due on Atorvastatin 40 mg under [place].   [place] Management Direct Dial [phone]  Fax [phone] [email or web address]

## 2017-10-27 ENCOUNTER — Encounter: Payer: Self-pay | Admitting: Family Medicine

## 2017-10-27 ENCOUNTER — Other Ambulatory Visit: Payer: Self-pay | Admitting: Family Medicine

## 2017-10-27 ENCOUNTER — Ambulatory Visit (INDEPENDENT_AMBULATORY_CARE_PROVIDER_SITE_OTHER): Payer: Medicare Other | Admitting: Family Medicine

## 2017-10-27 ENCOUNTER — Telehealth: Payer: Self-pay

## 2017-10-27 VITALS — BP 124/74 | HR 74 | Temp 98.3°F | Resp 14 | Ht 68.0 in | Wt 150.3 lb

## 2017-10-27 DIAGNOSIS — Z2821 Immunization not carried out because of patient refusal: Secondary | ICD-10-CM

## 2017-10-27 DIAGNOSIS — F439 Reaction to severe stress, unspecified: Secondary | ICD-10-CM | POA: Diagnosis not present

## 2017-10-27 DIAGNOSIS — R195 Other fecal abnormalities: Secondary | ICD-10-CM

## 2017-10-27 DIAGNOSIS — K7460 Unspecified cirrhosis of liver: Secondary | ICD-10-CM

## 2017-10-27 DIAGNOSIS — Z1231 Encounter for screening mammogram for malignant neoplasm of breast: Secondary | ICD-10-CM

## 2017-10-27 DIAGNOSIS — Z5181 Encounter for therapeutic drug level monitoring: Secondary | ICD-10-CM | POA: Diagnosis not present

## 2017-10-27 DIAGNOSIS — Z1239 Encounter for other screening for malignant neoplasm of breast: Secondary | ICD-10-CM

## 2017-10-27 DIAGNOSIS — E7849 Other hyperlipidemia: Secondary | ICD-10-CM

## 2017-10-27 DIAGNOSIS — I700 Atherosclerosis of aorta: Secondary | ICD-10-CM

## 2017-10-27 DIAGNOSIS — R9431 Abnormal electrocardiogram [ECG] [EKG]: Secondary | ICD-10-CM

## 2017-10-27 DIAGNOSIS — J432 Centrilobular emphysema: Secondary | ICD-10-CM

## 2017-10-27 DIAGNOSIS — I2510 Atherosclerotic heart disease of native coronary artery without angina pectoris: Secondary | ICD-10-CM | POA: Diagnosis not present

## 2017-10-27 DIAGNOSIS — Z720 Tobacco use: Secondary | ICD-10-CM | POA: Diagnosis not present

## 2017-10-27 DIAGNOSIS — R932 Abnormal findings on diagnostic imaging of liver and biliary tract: Secondary | ICD-10-CM

## 2017-10-27 DIAGNOSIS — Z13820 Encounter for screening for osteoporosis: Secondary | ICD-10-CM

## 2017-10-27 LAB — LIPID PANEL
CHOLESTEROL: 164 mg/dL (ref <200)
HDL: 48 mg/dL — ABNORMAL LOW (ref >50)
LDL Cholesterol (Calc): 102 mg/dL (calc) — ABNORMAL HIGH
Non-HDL Cholesterol (Calc): 116 mg/dL (calc) (ref <130)
Total CHOL/HDL Ratio: 3.4 (calc) (ref <5.0)
Triglycerides: 58 mg/dL (ref <150)

## 2017-10-27 LAB — COMPLETE METABOLIC PANEL WITH GFR
AG RATIO: 1.3 (calc) (ref 1.0–2.5)
ALKALINE PHOSPHATASE (APISO): 113 U/L (ref 33–130)
ALT: 17 U/L (ref 6–29)
AST: 24 U/L (ref 10–35)
Albumin: 4 g/dL (ref 3.6–5.1)
BILIRUBIN TOTAL: 0.6 mg/dL (ref 0.2–1.2)
BUN: 11 mg/dL (ref 7–25)
CHLORIDE: 100 mmol/L (ref 98–110)
CO2: 27 mmol/L (ref 20–32)
CREATININE: 0.68 mg/dL (ref 0.60–0.93)
Calcium: 9.4 mg/dL (ref 8.6–10.4)
GFR, Est African American: 103 mL/min/{1.73_m2} (ref >=60)
GFR, Est Non African American: 89 mL/min/{1.73_m2} (ref >=60)
GLOBULIN: 3 g/dL (ref 1.9–3.7)
Glucose, Bld: 80 mg/dL (ref 65–139)
POTASSIUM: 4.3 mmol/L (ref 3.5–5.3)
SODIUM: 134 mmol/L — AB (ref 135–146)
Total Protein: 7 g/dL (ref 6.1–8.1)

## 2017-10-27 LAB — CBC WITH DIFFERENTIAL/PLATELET
BASOS ABS: 21 {cells}/uL (ref 0–200)
Basophils Relative: 0.4 %
EOS PCT: 3.3 %
Eosinophils Absolute: 172 cells/uL (ref 15–500)
HCT: 37.1 % (ref 35.0–45.0)
Hemoglobin: 12.7 g/dL (ref 11.7–15.5)
Lymphs Abs: 1966 cells/uL (ref 850–3900)
MCH: 31.5 pg (ref 27.0–33.0)
MCHC: 34.2 g/dL (ref 32.0–36.0)
MCV: 92.1 fL (ref 80.0–100.0)
MONOS PCT: 7.9 %
MPV: 8.9 fL (ref 7.5–12.5)
NEUTROS PCT: 50.6 %
Neutro Abs: 2631 cells/uL (ref 1500–7800)
Platelets: 270 10*3/uL (ref 140–400)
RBC: 4.03 10*6/uL (ref 3.80–5.10)
RDW: 13.7 % (ref 11.0–15.0)
Total Lymphocyte: 37.8 %
WBC mixed population: 411 cells/uL (ref 200–950)
WBC: 5.2 10*3/uL (ref 3.8–10.8)

## 2017-10-27 MED ORDER — ATORVASTATIN CALCIUM 80 MG PO TABS: 80.0000 mg | ORAL_TABLET | Freq: Every day | ORAL | 3 refills | Status: DC

## 2017-10-27 NOTE — Assessment & Plan Note (Signed)
Encouraged cessation.

## 2017-10-27 NOTE — Assessment & Plan Note (Signed)
Likely inherited; discussed diet; check lipids; continue statin

## 2017-10-27 NOTE — Assessment & Plan Note (Signed)
Check liver 

## 2017-10-27 NOTE — Progress Notes (Signed)
BP 124/74   Pulse 74   Temp 98.3 F (36.8 C) (Oral)   Resp 14   Ht 5\' 8"  (1.727 m)   Wt 150 lb 4.8 oz (68.2 kg)   SpO2 97%   BMI 22.85 kg/m   Today's Vitals   10/27/17 1056 10/27/17 1108  BP: (!) 158/78 124/74  Pulse: 74   Resp: 14   Temp: 98.3 F (36.8 C)   TempSrc: Oral   SpO2: 97%   Weight: 150 lb 4.8 oz (68.2 kg)   Height: 5\' 8"  (1.727 m)     Subjective:    Patient ID: Julie Beck, female    DOB: 1946-03-20, 72 y.o.   MRN: [id number]  HPI: Julie Beck is a 72 y.o. female  Chief Complaint  Patient presents with  . Follow-up    HPI Patient is here for f/u  She has high blood pressure today; stressed, moving issues at home; son lived with her for the last year and a half; son is now leaving; she can feel it; I asked about salty foods in the last few days; eggs, pancakes, sausage; no salty snacks  She has high cholesterol; on statin; no muscle aches; never hot dogs; occasional bacon or sausage on Sunday, son cooking meal on Sundays; eggs and a nibble of the meat; can't stand to cook bacon and he's leaving Lab Results  Component Value Date   CHOL 168 08/23/2016   HDL 44 (L) 08/23/2016   LDLCALC 113 (H) 08/23/2016   TRIG 55 08/23/2016   CHOLHDL 3.8 08/23/2016   She is a smoker; with all the stress, she has been "really bad"; she bought a pack 3 weeks ago, still smoking that pack; wants to go down in the garage and take 2 puffs  She was found to have aortic athero and CAD on the chest CT on 06/16/17; no chest pain; no one in the family had heart disease that she can think of; MGM lived to be 38, heart just gave out; mother had alzheimers and lived to be 49  She has emphysema too on the chest CT, lung cancer screen; had some phlegm using nasal spray to help; throat drainage; if setting out at night, wet weather, nasal congestion, opens up, then coughs up; not using afrin  Mammo in January; gets them every year; runs in the family; she wants to stay active,  gardens, mows grass  She had what she thought was kidney stones, could hardly walk and they gave her oxycodone  She had a positive cologuard test in 2018; GI tried to contact her twice; patient isn't sure if that's when he son was going through divorce; she thought she didn't need to get it done; I reaffirmed with her that it does in fact appear that she needs to have a colonoscopy; she denies abd pain, blood in the stool  She declines pneumonia vaccine and DEXA today  Depression screen [facility] 2/9 10/27/2017 10/06/2017 07/12/2016 05/12/2016 03/17/2016  Decreased Interest 0 0 0 0 0  Down, Depressed, Hopeless 0 0 0 0 0  PHQ - 2 Score 0 0 0 0 0  Altered sleeping - 0 - - -  Tired, decreased energy - 0 - - -  Change in appetite - 0 - - -  Feeling bad or failure about yourself  - 0 - - -  Trouble concentrating - 0 - - -  Moving slowly or fidgety/restless - 0 - - -  Suicidal thoughts -  0 - - -  PHQ-9 Score - 0 - - -  Difficult doing work/chores - Not difficult at all - - -    Relevant past medical, surgical, family and social history reviewed Past Medical History:  Diagnosis Date  . Abnormal CT of liver 07/12/2016   Chest CT Feb 2018  . Arthritis   . Centrilobular emphysema ([place]) 07/12/2016   Chest CT Feb 2018  . Coronary artery disease 07/12/2016   Followed by Dr. Saunders Revel, seen on chest CT Feb 2018  . Hyperlipidemia   . Overweight (BMI 25.0-29.9) 03/17/2016  . Thoracic aortic atherosclerosis ([place]) 07/12/2016   Chest CT scan Feb 2018  . Tobacco abuse 03/17/2016   Past Surgical History:  Procedure Laterality Date  . INGUINAL HERNIA REPAIR Bilateral   . KNEE ARTHROSCOPY Bilateral   . TUBAL LIGATION     Family History  Problem Relation Age of Onset  . Alzheimer's disease Mother   . Breast cancer Mother   . Cancer Father        lung  . Dementia Sister   . Cancer Maternal Grandmother        breast  . Breast cancer Maternal Grandmother   . Heart attack Maternal Grandfather   . Healthy  Sister   . Breast cancer Maternal Aunt   . Bladder Cancer Neg Hx   . Kidney cancer Neg Hx    Social History   Tobacco Use  . Smoking status: Current Some Day Smoker    Packs/day: 0.25    Years: 52.00    Pack years: 13.00    Types: Cigarettes  . Smokeless tobacco: Never Used  Substance Use Topics  . Alcohol use: Yes    Comment: socially  . Drug use: No    Interim medical history since last visit reviewed. Allergies and medications reviewed  Review of Systems Per HPI unless specifically indicated above     Objective:    BP 124/74   Pulse 74   Temp 98.3 F (36.8 C) (Oral)   Resp 14   Ht 5\' 8"  (1.727 m)   Wt 150 lb 4.8 oz (68.2 kg)   SpO2 97%   BMI 22.85 kg/m   Wt Readings from Last 3 Encounters:  10/27/17 150 lb 4.8 oz (68.2 kg)  10/06/17 148 lb 3.2 oz (67.2 kg)  06/16/17 164 lb (74.4 kg)    Physical Exam  Constitutional: She appears well-developed and well-nourished. No distress.  HENT:  Head: Normocephalic and atraumatic.  Eyes: EOM are normal. No scleral icterus.  Neck: No thyromegaly present.  Cardiovascular: Normal rate, regular rhythm and normal heart sounds.  No murmur heard. Pulmonary/Chest: Effort normal and breath sounds normal. No respiratory distress. She has no wheezes.  Abdominal: Soft. Bowel sounds are normal. She exhibits no distension.  Musculoskeletal: She exhibits no edema.  Neurological: She is alert. She exhibits normal muscle tone.  Skin: Skin is warm and dry. She is not diaphoretic. No pallor.  Psychiatric: She has a normal mood and affect. Her behavior is normal. Judgment and thought content normal.   Results for orders placed or performed in visit on 01/27/17  Microscopic Examination  Result Value Ref Range   WBC, UA 0-5 0 - 5 /hpf   RBC, UA None seen 0 - 2 /hpf   Epithelial Cells (non renal) 0-10 0 - 10 /hpf   Bacteria, UA Few (A) None seen/Few  Urinalysis, Complete  Result Value Ref Range   Specific Gravity, UA 1.015 1.005 -  1.030   pH, UA 6.0 5.0 - 7.5   Color, UA Yellow Yellow   Appearance Ur Clear Clear   Leukocytes, UA Trace (A) Negative   Protein, UA Negative Negative/Trace   Glucose, UA Negative Negative   Ketones, UA Negative Negative   RBC, UA Trace (A) Negative   Bilirubin, UA Negative Negative   Urobilinogen, Ur 0.2 0.2 - 1.0 mg/dL   Nitrite, UA Negative Negative   Microscopic Examination See below:       Assessment & Plan:   Problem List Items Addressed This Visit      Cardiovascular and Mediastinum   Thoracic aortic atherosclerosis (HCC) (Chronic)    Goal LDL is less than 70; check lipids today; taking aspirin; taking statin      Coronary artery disease (Chronic)    Asymptomatic; discussed calcium scoring and she says it would possibly change behavior      Relevant Orders   CT CARDIAC SCORING     Respiratory   Centrilobular emphysema (HCC) (Chronic)    Not limiting her activities for now; declined seeing specialist; smoking cessation is key        Digestive   Cirrhosis of liver ([place])    Incidental comment in 2018; f/u scan no mention of liver abnormality; just check LFTs        Other   Tobacco abuse    Encouraged cessation      Relevant Orders   CT CARDIAC SCORING   Screening for osteoporosis    Patient declines DEXA scan      Positive colorectal cancer screening using DNA-based stool test    Reviewed chart; patient did not get the colonoscopy done; note sent to GI to get this arranged; she is asymptomatic, no blood in stool      Medication monitoring encounter    Check liver      Relevant Orders   CBC with Differential/Platelet   COMPLETE METABOLIC PANEL WITH GFR   Hyperlipidemia, familial, high LDL - Primary (Chronic)    Likely inherited; discussed diet; check lipids; continue statin      Relevant Orders   Lipid panel   Breast cancer screening    yearly      Abnormal EKG    Already saw the cardiologist, Dr. Saunders Revel; will be getting cardiac calcium  scoring, as she believes that this information might convince her to quit smoking      Abnormal CT of liver    Discussed with her today; first imaging in 2018 mentioned possibly irregular capsule contour, but second imaging reported normal liver; she denies any symptoms; neg hep C; not a heavy drinker       Other Visit Diagnoses    Tobacco use       Relevant Orders   CT CARDIAC SCORING   Stress at home       affecting her blood pressure; discussed mindfulness, being cognizant about how stress may be impacting her BP; don't want her to stroke b/c of stress   Refused pneumococcal vaccination       patient refused pneumonia vaccine       Follow up plan: Return in about 6 months (around 04/28/2018) for follow-up visit with Dr. Sanda Klein.  An after-visit summary was printed and given to the patient at [facility].  Please see the patient instructions which may contain other information and recommendations beyond what is mentioned above in the assessment and plan.  No orders of the defined types were placed in this encounter.  Orders Placed This Encounter  Procedures  . CT CARDIAC SCORING  . CBC with Differential/Platelet  . COMPLETE METABOLIC PANEL WITH GFR  . Lipid panel   Face-to-face time with patient was more than 40 minutes, >50% time spent counseling and coordination of care

## 2017-10-27 NOTE — Assessment & Plan Note (Signed)
yearly

## 2017-10-27 NOTE — Assessment & Plan Note (Signed)
Not limiting her activities for now; declined seeing specialist; smoking cessation is key

## 2017-10-27 NOTE — Progress Notes (Signed)
Increase statin; recheck lipids in 6-8 weeks 

## 2017-10-27 NOTE — Assessment & Plan Note (Signed)
Goal LDL is less than 70; check lipids today; taking aspirin; taking statin

## 2017-10-27 NOTE — Assessment & Plan Note (Signed)
Discussed with her today; first imaging in 2018 mentioned possibly irregular capsule contour, but second imaging reported normal liver; she denies any symptoms; neg hep C; not a heavy drinker

## 2017-10-27 NOTE — Assessment & Plan Note (Signed)
Patient declines DEXA scan

## 2017-10-27 NOTE — Telephone Encounter (Signed)
Left vm for pt to return my call to schedule colonoscopy. 

## 2017-10-27 NOTE — Assessment & Plan Note (Signed)
Asymptomatic; discussed calcium scoring and she says it would possibly change behavior

## 2017-10-27 NOTE — Assessment & Plan Note (Signed)
Reviewed chart; patient did not get the colonoscopy done; note sent to GI to get this arranged; she is asymptomatic, no blood in stool

## 2017-10-27 NOTE — Assessment & Plan Note (Addendum)
Already saw the cardiologist, Dr. Saunders Revel; will be getting cardiac calcium scoring, as she believes that this information might convince her to quit smoking

## 2017-10-27 NOTE — Assessment & Plan Note (Signed)
Incidental comment in 2018; f/u scan no mention of liver abnormality; just check LFTs

## 2017-10-27 NOTE — Patient Instructions (Addendum)
Use calm breathing and mindfulness when feeling stressed Try to limit egg yolks to no more than 3 per week  I do encourage you to quit smoking Call [phone] to sign up for smoking cessation classes You can call 1-800-QUIT-NOW to talk with a smoking cessation coach  You should be hearing from the gastroenterologist and someone about the calcium scoring for your heart   Health Risks of Smoking Smoking cigarettes is very bad for your health. Tobacco smoke has over 200 known poisons in it. It contains the poisonous gases nitrogen oxide and carbon monoxide. There are over 60 chemicals in tobacco smoke that cause cancer. Smoking is difficult to quit because a chemical in tobacco, called nicotine, causes addiction or dependence. When you smoke and inhale, nicotine is absorbed rapidly into the bloodstream through your lungs. Both inhaled and non-inhaled nicotine may be addictive. What are the risks of cigarette smoke? Cigarette smokers have an increased risk of many serious medical problems, including:  Lung cancer.  Lung disease, such as pneumonia, bronchitis, and emphysema.  Chest pain (angina) and heart attack because the heart is not getting enough oxygen.  Heart disease and peripheral blood vessel disease.  High blood pressure (hypertension).  Stroke.  Oral cancer, including cancer of the lip, mouth, or voice box.  Bladder cancer.  Pancreatic cancer.  Cervical cancer.  Pregnancy complications, including premature birth.  Stillbirths and smaller newborn babies, birth defects, and genetic damage to sperm.  Early menopause.  Lower estrogen level for women.  Infertility.  Facial wrinkles.  Blindness.  Increased risk of broken bones (fractures).  Senile dementia.  Stomach ulcers and internal bleeding.  Delayed wound healing and increased risk of complications during surgery.  Even smoking lightly shortens your life expectancy by several years.  Because of  secondhand smoke exposure, children of smokers have an increased risk of the following:  Sudden infant death syndrome (SIDS).  Respiratory infections.  Lung cancer.  Heart disease.  Ear infections.  What are the benefits of quitting? There are many health benefits of quitting smoking. Here are some of them:  Within days of quitting smoking, your risk of having a heart attack decreases, your blood flow improves, and your lung capacity improves. Blood pressure, pulse rate, and breathing patterns start returning to normal soon after quitting.  Within months, your lungs may clear up completely.  Quitting for 10 years reduces your risk of developing lung cancer and heart disease to almost that of a nonsmoker.  People who quit may see an improvement in their overall quality of life.  How do I quit smoking? Smoking is an addiction with both physical and psychological effects, and longtime habits can be hard to change. Your health care provider can recommend:  Programs and community resources, which may include group support, education, or talk therapy.  Prescription medicines to help reduce cravings.  Nicotine replacement products, such as patches, gum, and nasal sprays. Use these products only as directed. Do not replace cigarette smoking with electronic cigarettes, which are commonly called e-cigarettes. The safety of e-cigarettes is not known, and some may contain harmful chemicals.  A combination of two or more of these methods.  Where to find more information:  American Lung Association: www.lung.org  American Cancer Society: www.cancer.org Summary  Smoking cigarettes is very bad for your health. Cigarette smokers have an increased risk of many serious medical problems, including several cancers, heart disease, and stroke.  Smoking is an addiction with both physical and psychological effects, and longtime  habits can be hard to change.  By stopping right away, you can  greatly reduce the risk of medical problems for you and your family.  To help you quit smoking, your health care provider can recommend programs, community resources, prescription medicines, and nicotine replacement products such as patches, gum, and nasal sprays. This information is not intended to replace advice given to you by your health care provider. Make sure you discuss any questions you have with your health care provider. Document Released: 06/10/2004 Document Revised: 05/07/2016 Document Reviewed: 05/07/2016 Elsevier Interactive Patient Education  2017 [employer].  Steps to Quit Smoking Smoking tobacco can be bad for your health. It can also affect almost every organ in your body. Smoking puts you and people around you at risk for many serious long-lasting (chronic) diseases. Quitting smoking is hard, but it is one of the best things that you can do for your health. It is never too late to quit. What are the benefits of quitting smoking? When you quit smoking, you lower your risk for getting serious diseases and conditions. They can include:  Lung cancer or lung disease.  Heart disease.  Stroke.  Heart attack.  Not being able to have children (infertility).  Weak bones (osteoporosis) and broken bones (fractures).  If you have coughing, wheezing, and shortness of breath, those symptoms may get better when you quit. You may also get sick less often. If you are pregnant, quitting smoking can help to lower your chances of having a baby of low birth weight. What can I do to help me quit smoking? Talk with your doctor about what can help you quit smoking. Some things you can do (strategies) include:  Quitting smoking totally, instead of slowly cutting back how much you smoke over a period of time.  Going to in-person counseling. You are more likely to quit if you go to many counseling sessions.  Using resources and support systems, such as: ? Database administrator with a  Social worker. ? Phone quitlines. ? Careers information officer. ? Support groups or group counseling. ? Text messaging programs. ? Mobile phone apps or applications.  Taking medicines. Some of these medicines may have nicotine in them. If you are pregnant or breastfeeding, do not take any medicines to quit smoking unless your doctor says it is okay. Talk with your doctor about counseling or other things that can help you.  Talk with your doctor about using more than one strategy at the same time, such as taking medicines while you are also going to in-person counseling. This can help make quitting easier. What things can I do to make it easier to quit? Quitting smoking might feel very hard at first, but there is a lot that you can do to make it easier. Take these steps:  Talk to your family and friends. Ask them to support and encourage you.  Call phone quitlines, reach out to support groups, or work with a Social worker.  Ask people who smoke to not smoke around you.  Avoid places that make you want (trigger) to smoke, such as: ? Bars. ? Parties. ? Smoke-break areas at work.  Spend time with people who do not smoke.  Lower the stress in your life. Stress can make you want to smoke. Try these things to help your stress: ? Getting regular exercise. ? Deep-breathing exercises. ? Yoga. ? Meditating. ? Doing a body scan. To do this, close your eyes, focus on one area of your body at a time from  head to toe, and notice which parts of your body are tense. Try to relax the muscles in those areas.  Download or buy apps on your mobile phone or tablet that can help you stick to your quit plan. There are many free apps, such as QuitGuide from the [employer] Office manager for Disease Control and Prevention). You can find more support from smokefree.gov and other websites.  This information is not intended to replace advice given to you by your health care provider. Make sure you discuss any questions you have  with your health care provider. Document Released: 02/27/2009 Document Revised: 12/30/2015 Document Reviewed: 09/17/2014 Elsevier Interactive Patient Education  2018 [employer].

## 2017-11-01 NOTE — Telephone Encounter (Signed)
Left vm again for pt to return my call to schedule colonoscopy.

## 2017-11-03 ENCOUNTER — Other Ambulatory Visit: Payer: Self-pay

## 2017-11-03 DIAGNOSIS — R195 Other fecal abnormalities: Secondary | ICD-10-CM

## 2017-11-03 DIAGNOSIS — Z1211 Encounter for screening for malignant neoplasm of colon: Secondary | ICD-10-CM

## 2017-11-28 ENCOUNTER — Encounter
Admission: RE | Disposition: A | Discharge status: Home / Self Care | Payer: Self-pay | Source: Ambulatory Visit | Attending: Gastroenterology

## 2017-11-28 ENCOUNTER — Ambulatory Visit
Admission: RE | Admit: 2017-11-28 | Discharge: 2017-11-28 | Disposition: A | Discharge status: Home / Self Care | Payer: Medicare Other | Source: Ambulatory Visit | Attending: Gastroenterology | Admitting: Gastroenterology

## 2017-11-28 ENCOUNTER — Ambulatory Visit: Payer: Medicare Other | Admitting: Anesthesiology

## 2017-11-28 ENCOUNTER — Encounter: Payer: Self-pay | Admitting: Anesthesiology

## 2017-11-28 DIAGNOSIS — R195 Other fecal abnormalities: Secondary | ICD-10-CM

## 2017-11-28 DIAGNOSIS — Z79899 Other long term (current) drug therapy: Secondary | ICD-10-CM | POA: Insufficient documentation

## 2017-11-28 DIAGNOSIS — E785 Hyperlipidemia, unspecified: Secondary | ICD-10-CM | POA: Diagnosis not present

## 2017-11-28 DIAGNOSIS — D120 Benign neoplasm of cecum: Secondary | ICD-10-CM | POA: Insufficient documentation

## 2017-11-28 DIAGNOSIS — K635 Polyp of colon: Secondary | ICD-10-CM | POA: Insufficient documentation

## 2017-11-28 DIAGNOSIS — K648 Other hemorrhoids: Secondary | ICD-10-CM | POA: Insufficient documentation

## 2017-11-28 DIAGNOSIS — D125 Benign neoplasm of sigmoid colon: Secondary | ICD-10-CM

## 2017-11-28 DIAGNOSIS — I2510 Atherosclerotic heart disease of native coronary artery without angina pectoris: Secondary | ICD-10-CM | POA: Diagnosis not present

## 2017-11-28 DIAGNOSIS — F17210 Nicotine dependence, cigarettes, uncomplicated: Secondary | ICD-10-CM | POA: Diagnosis not present

## 2017-11-28 DIAGNOSIS — D122 Benign neoplasm of ascending colon: Secondary | ICD-10-CM

## 2017-11-28 DIAGNOSIS — Z1211 Encounter for screening for malignant neoplasm of colon: Secondary | ICD-10-CM

## 2017-11-28 DIAGNOSIS — D123 Benign neoplasm of transverse colon: Secondary | ICD-10-CM | POA: Diagnosis not present

## 2017-11-28 DIAGNOSIS — Z7982 Long term (current) use of aspirin: Secondary | ICD-10-CM | POA: Insufficient documentation

## 2017-11-28 DIAGNOSIS — K649 Unspecified hemorrhoids: Secondary | ICD-10-CM | POA: Diagnosis not present

## 2017-11-28 HISTORY — PX: COLONOSCOPY WITH PROPOFOL: SHX5780

## 2017-11-28 SURGERY — COLONOSCOPY WITH PROPOFOL
Anesthesia: General

## 2017-11-28 MED ORDER — SODIUM CHLORIDE 0.9 % IV SOLN
INTRAVENOUS | Status: DC
  Administered 2017-11-28: 1000 mL via INTRAVENOUS

## 2017-11-28 MED ORDER — PROPOFOL 10 MG/ML IV BOLUS
INTRAVENOUS | Status: DC | PRN
  Administered 2017-11-28: 90 mg via INTRAVENOUS
  Administered 2017-11-28: 40 mg via INTRAVENOUS

## 2017-11-28 MED ORDER — MIDAZOLAM HCL 2 MG/2ML IJ SOLN
INTRAMUSCULAR | Status: AC
  Filled 2017-11-28: qty 2

## 2017-11-28 MED ORDER — ONDANSETRON HCL 4 MG/2ML IJ SOLN
INTRAMUSCULAR | Status: DC | PRN
  Administered 2017-11-28: 4 mg via INTRAVENOUS

## 2017-11-28 MED ORDER — ONDANSETRON HCL 4 MG/2ML IJ SOLN
INTRAMUSCULAR | Status: AC
  Filled 2017-11-28: qty 2

## 2017-11-28 MED ORDER — PROPOFOL 500 MG/50ML IV EMUL
INTRAVENOUS | Status: DC | PRN
  Administered 2017-11-28: 120 ug/kg/min via INTRAVENOUS

## 2017-11-28 MED ORDER — MIDAZOLAM HCL 2 MG/2ML IJ SOLN
INTRAMUSCULAR | Status: DC | PRN
  Administered 2017-11-28 (×2): 1 mg via INTRAVENOUS

## 2017-11-28 MED ORDER — SODIUM CHLORIDE 0.9 % IJ SOLN
INTRAMUSCULAR | Status: DC | PRN
  Administered 2017-11-28 (×2): 1 mL via INTRAVENOUS
  Administered 2017-11-28: 2 mL via INTRAVENOUS

## 2017-11-28 MED ORDER — METHYLENE BLUE 1 % INJ SOLN
INTRAMUSCULAR | Status: AC
  Filled 2017-11-28: qty 10

## 2017-11-28 MED ORDER — GLYCOPYRROLATE PF 0.2 MG/ML IJ SOSY
PREFILLED_SYRINGE | INTRAMUSCULAR | Status: DC | PRN
  Administered 2017-11-28: .2 mg via INTRAVENOUS

## 2017-11-28 NOTE — Anesthesia Preprocedure Evaluation (Signed)
Anesthesia Evaluation  Patient identified by MRN, date of birth, ID band Patient awake    Reviewed: Allergy & Precautions, H&P , NPO status , Patient's Chart, lab work & pertinent test results, reviewed documented beta blocker date and time   History of Anesthesia Complications Negative for: history of anesthetic complications  Airway Mallampati: III  TM Distance: >3 FB Neck ROM: full    Dental  (+) Dental Advidsory Given   Pulmonary neg shortness of breath, COPD, neg recent URI, Current Smoker,           Cardiovascular Exercise Tolerance: Good (-) hypertension(-) angina+ CAD and + Peripheral Vascular Disease  (-) Past MI, (-) Cardiac Stents and (-) CABG (-) dysrhythmias (-) Valvular Problems/Murmurs     Neuro/Psych negative neurological ROS  negative psych ROS   GI/Hepatic negative GI ROS, Neg liver ROS,   Endo/Other  negative endocrine ROS  Renal/GU negative Renal ROS  negative genitourinary   Musculoskeletal   Abdominal   Peds  Hematology negative hematology ROS (+)   Anesthesia Other Findings Past Medical History: 07/12/2016: Abnormal CT of liver     Comment:  Chest CT Feb 2018 No date: Arthritis 07/12/2016: Centrilobular emphysema [facility])     Comment:  Chest CT Feb 2018 07/12/2016: Coronary artery disease     Comment:  Followed by Dr. Saunders Revel, seen on chest CT Feb 2018 No date: Hyperlipidemia 03/17/2016: Overweight (BMI 25.0-29.9) 07/12/2016: Thoracic aortic atherosclerosis ([facility])     Comment:  Chest CT scan Feb 2018 03/17/2016: Tobacco abuse   Reproductive/Obstetrics negative OB ROS                             Anesthesia Physical Anesthesia Plan  ASA: II  Anesthesia Plan: General   Post-op Pain Management:    Induction: Intravenous  PONV Risk Score and Plan: 2 and Propofol infusion and TIVA  Airway Management Planned: Nasal Cannula  Additional Equipment:   Intra-op Plan:    Post-operative Plan:   Informed Consent: I have reviewed the patients History and Physical, chart, labs and discussed the procedure including the risks, benefits and alternatives for the proposed anesthesia with the patient or authorized representative who has indicated his/her understanding and acceptance.   Dental Advisory Given  Plan Discussed with: Anesthesiologist, CRNA and Surgeon  Anesthesia Plan Comments:         Anesthesia Quick Evaluation

## 2017-11-28 NOTE — Transfer of Care (Signed)
Immediate Anesthesia Transfer of Care Note  Patient: Julie Beck  Procedure(s) Performed: COLONOSCOPY WITH PROPOFOL (N/A )  Patient Location: Endoscopy Unit  Anesthesia Type:General  Level of Consciousness: drowsy and patient cooperative  Airway & Oxygen Therapy: Patient Spontanous Breathing  Post-op Assessment: Report given to RN and Post -op Vital signs reviewed and stable  Post vital signs: Reviewed and stable  Last Vitals:  Vitals Value Taken Time  BP 137/72 11/28/2017 11:37 AM  Temp 36.1 C 11/28/2017 11:36 AM  Pulse 83 11/28/2017 11:37 AM  Resp 15 11/28/2017 11:37 AM  SpO2 99 % 11/28/2017 11:37 AM  Vitals shown include unvalidated device data.  Last Pain:  Vitals:   11/28/17 1136  TempSrc: Tympanic  PainSc: 0-No pain      Patients Stated Pain Goal: 0 (84/03/35 3317)  Complications: No apparent anesthesia complications

## 2017-11-28 NOTE — H&P (Signed)
Cephas Darby, MD [address]  [place]  [place], [place] [address]  Main: [phone]  Fax: [phone] Pager: [phone]  Primary Care Physician:  Arnetha Courser, MD Primary Gastroenterologist:  Dr. Cephas Darby  Pre-Procedure History & Physical: HPI:  Julie Beck is a 72 y.o. female is here for an colonoscopy.   Past Medical History:  Diagnosis Date  . Abnormal CT of liver 07/12/2016   Chest CT Feb 2018  . Arthritis   . Centrilobular emphysema ([place]) 07/12/2016   Chest CT Feb 2018  . Coronary artery disease 07/12/2016   Followed by Dr. Saunders Revel, seen on chest CT Feb 2018  . Hyperlipidemia   . Overweight (BMI 25.0-29.9) 03/17/2016  . Thoracic aortic atherosclerosis ([place]) 07/12/2016   Chest CT scan Feb 2018  . Tobacco abuse 03/17/2016    Past Surgical History:  Procedure Laterality Date  . INGUINAL HERNIA REPAIR Bilateral   . KNEE ARTHROSCOPY Bilateral   . TUBAL LIGATION      Prior to Admission medications   Medication Sig Start Date End Date Taking? Authorizing Provider  aspirin EC 81 MG tablet Take 1 tablet (81 mg total) by mouth daily. 07/12/16  Yes Lada, Satira Anis, MD  atorvastatin (LIPITOR) 80 MG tablet Take 1 tablet (80 mg total) by mouth at bedtime. 10/27/17  Yes Lada, Satira Anis, MD  calcium-vitamin D (OSCAL WITH D) 500-200 MG-UNIT tablet Take 1 tablet by mouth every morning.   Yes [provider]  Multiple Vitamin (MULTIVITAMIN) tablet Take 1 tablet by mouth daily.   Yes [provider]  omeprazole (PRILOSEC) 20 MG capsule TAKE 1 CAPSULE BY MOUTH DAILY 03/04/16  Yes [provider]    Allergies as of 11/03/2017 - Review Complete 10/27/2017  Allergen Reaction Noted  . Sulfa antibiotics Hives 11/10/2013    Family History  Problem Relation Age of Onset  . Alzheimer's disease Mother   . Breast cancer Mother   . Cancer Father        lung  . Dementia Sister   . Cancer Maternal Grandmother        breast  . Breast cancer  Maternal Grandmother   . Heart attack Maternal Grandfather   . Healthy Sister   . Breast cancer Maternal Aunt   . Bladder Cancer Neg Hx   . Kidney cancer Neg Hx     Social History   Socioeconomic History  . Marital status: Divorced    Spouse name: Not on file  . Number of children: 2  . Years of education: some college  . Highest education level: 12th grade  Occupational History    Employer: OTHER  Social Needs  . Financial resource strain: Not hard at all  . Food insecurity:    Worry: Never true    Inability: Never true  . Transportation needs:    Medical: No    Non-medical: No  Tobacco Use  . Smoking status: Current Some Day Smoker    Packs/day: 0.25    Years: 52.00    Pack years: 13.00    Types: Cigarettes  . Smokeless tobacco: Never Used  Substance and Sexual Activity  . Alcohol use: Yes    Comment: socially  . Drug use: No  . Sexual activity: Not Currently    Partners: Female  Lifestyle  . Physical activity:    Days per week: 0 days    Minutes per session: 0 min  . Stress: Not at all  Relationships  .  Social connections:    Talks on phone: Patient refused    Gets together: Patient refused    Attends religious service: Patient refused    Active member of club or organization: Patient refused    Attends meetings of clubs or organizations: Patient refused    Relationship status: Divorced  . Intimate partner violence:    Fear of current or ex partner: No    Emotionally abused: No    Physically abused: No    Forced sexual activity: No  Other Topics Concern  . Not on file  Social History Narrative  . Not on file    Review of Systems: See HPI, otherwise negative ROS  Physical Exam: BP 124/87   Pulse 86   Temp (!) 96 F (35.6 C) (Tympanic)   Resp 20   Ht 5\' 7"  (1.702 m)   Wt 154 lb (69.9 kg)   SpO2 100%   BMI 24.12 kg/m  General:   Alert,  pleasant and cooperative in NAD Head:  Normocephalic and atraumatic. Neck:  Supple; no masses or  thyromegaly. Lungs:  Clear throughout to auscultation.    Heart:  Regular rate and rhythm. Abdomen:  Soft, nontender and nondistended. Normal bowel sounds, without guarding, and without rebound.   Neurologic:  Alert and  oriented x4;  grossly normal neurologically.  Impression/Plan: Julie Beck is here for an colonoscopy to be performed for cologaurd positive  Risks, benefits, limitations, and alternatives regarding  colonoscopy have been reviewed with the patient.  Questions have been answered.  All parties agreeable.   Sherri Sear, MD  11/28/2017, 9:46 AM

## 2017-11-28 NOTE — Anesthesia Post-op Follow-up Note (Signed)
Anesthesia QCDR form completed.        

## 2017-11-28 NOTE — Op Note (Addendum)
[facility] Patient Name: Julie Beck Procedure Date: 11/28/2017 9:52 AM MRN: [id number] Account #: [id number] Date of Birth: 12-10-1945 Admit Type: Outpatient Age: 72 Room: [facility] ENDO ROOM 2 Gender: Female Note Status: Finalized Procedure:            Colonoscopy Indications:          Positive Cologuard test Providers:            Lin Landsman MD, MD Referring MD:         Arnetha Courser (Referring MD) Medicines:            Monitored Anesthesia Care Complications:        No immediate complications. Estimated blood loss: None. Procedure:            Pre-Anesthesia Assessment:                       - Prior to the procedure, a History and Physical was                        performed, and patient medications and allergies were                        reviewed. The patient is competent. The risks and                        benefits of the procedure and the sedation options and                        risks were discussed with the patient. All questions                        were answered and informed consent was obtained.                        Patient identification and proposed procedure were                        verified by the physician, the nurse, the                        anesthesiologist, the anesthetist and the technician in                        the pre-procedure area in the procedure room in the                        endoscopy suite. Mental Status Examination: alert and                        oriented. Airway Examination: normal oropharyngeal                        airway and neck mobility. Respiratory Examination:                        clear to auscultation. CV Examination: normal.                        Prophylactic Antibiotics: The patient does not require  prophylactic antibiotics. Prior Anticoagulants: The                        patient has taken no previous anticoagulant or                         antiplatelet agents. ASA Grade Assessment: II - A                        patient with mild systemic disease. After reviewing the                        risks and benefits, the patient was deemed in                        satisfactory condition to undergo the procedure. The                        anesthesia plan was to use monitored anesthesia care                        (MAC). Immediately prior to administration of                        medications, the patient was re-assessed for adequacy                        to receive sedatives. The heart rate, respiratory rate,                        oxygen saturations, blood pressure, adequacy of                        pulmonary ventilation, and response to care were                        monitored throughout the procedure. The physical status                        of the patient was re-assessed after the procedure.                       After obtaining informed consent, the colonoscope was                        passed under direct vision. Throughout the procedure,                        the patient's blood pressure, pulse, and oxygen                        saturations were monitored continuously. The                        Colonoscope was introduced through the anus and                        advanced to the the cecum, identified by appendiceal  orifice and ileocecal valve. The colonoscopy was                        unusually difficult due to significant looping and a                        tortuous colon. Successful completion of the procedure                        was aided by applying abdominal pressure. The patient                        tolerated the procedure well. The quality of the bowel                        preparation was evaluated using the BBPS [facility] Bowel                        Preparation Scale) with scores of: Right Colon = 3,                        Transverse Colon = 3 and Left Colon = 3 (entire mucosa                         seen well with no residual staining, small fragments of                        stool or opaque liquid). The total BBPS score equals 9. Findings:      Hemorrhoids were found on perianal exam.      A 6 mm polyp was found in the cecum. The polyp was flat. Preparations       were made for mucosal resection. Saline was injected to raise the       lesion. Snare mucosal resection was performed. Resection and retrieval       were complete.      A diminutive polyp was found in the cecum. The polyp was sessile. The       polyp was removed with a cold biopsy forceps. Resection and retrieval       were complete.      A 7 mm polyp was found in the ascending colon. The polyp was sessile.       Preparations were made for mucosal resection. Saline was injected to       raise the lesion. Snare mucosal resection was performed. Resection and       retrieval were complete.      A 12 mm polyp was found in the mid transverse colon. The polyp was flat.       Preparations were made for mucosal resection. Eleview was injected to       raise the lesion. Snare mucosal resection was performed. Resection and       retrieval were complete.      A 10 mm polyp was found in the mid transverse colon. The polyp was flat.       Preparations were made for mucosal resection. Eleview was injected to       raise the lesion. Snare mucosal resection was performed. Resection was       incomplete. The resected tissue was retrieved. Area was tattooed with an  injection of Spot (carbon black).      A 7 mm polyp was found in the sigmoid colon. The polyp was sessile. The       polyp was removed with a hot snare. Resection and retrieval were       complete.      The retroflexed view of the distal rectum and anal verge was normal and       showed no anal or rectal abnormalities. Impression:           - Hemorrhoids found on perianal exam.                       - One 6 mm polyp in the cecum, removed with  mucosal                        resection. Resected and retrieved.                       - One diminutive polyp in the cecum, removed with a                        cold biopsy forceps. Resected and retrieved.                       - One 7 mm polyp in the ascending colon, removed with                        mucosal resection. Resected and retrieved.                       - One 12 mm polyp in the mid transverse colon, removed                        with mucosal resection. Resected and retrieved.                       - One 10 mm polyp in the mid transverse colon, removed                        with mucosal resection. Incomplete resection. Resected                        tissue retrieved. Tattooed.                       - One 7 mm polyp in the sigmoid colon, removed with a                        hot snare. Resected and retrieved.                       - The distal rectum and anal verge are normal on                        retroflexion view.                       - Mucosal resection was performed. Resection and  retrieval were complete.                       - Mucosal resection was performed. Resection and                        retrieval were complete.                       - Mucosal resection was performed. Resection and                        retrieval were complete.                       - Mucosal resection was performed. Resection was                        incomplete. The resected tissue was retrieved. Recommendation:       - Discharge patient to home (with escort).                       - Resume previous diet today.                       - Continue present medications.                       - Await pathology results.                       - Repeat colonoscopy in 1 year for surveillance after                        piecemeal polypectomy.                       - Return to my office in 2 weeks. Procedure Code(s):    --- Professional ---                       [id number],  60, Colonoscopy, flexible; with endoscopic                        mucosal resection                       [phone], Colonoscopy, flexible; with removal of tumor(s),                        polyp(s), or other lesion(s) by snare technique                       [id number], 56, Colonoscopy, flexible; with biopsy, single                        or multiple Diagnosis Code(s):    --- Professional ---                       D12.0, Benign neoplasm of cecum                       D12.2, Benign neoplasm of ascending colon  D12.3, Benign neoplasm of transverse colon (hepatic                        flexure or splenic flexure)                       D12.5, Benign neoplasm of sigmoid colon                       K64.9, Unspecified hemorrhoids                       R19.5, Other fecal abnormalities CPT copyright 2017 American Medical Association. All rights reserved. The codes documented in this report are preliminary and upon coder review may  be revised to meet current compliance requirements. Dr. Ulyess Mort Lin Landsman MD, MD 11/28/2017 11:40:57 AM This report has been signed electronically. Number of Addenda: 0 Note Initiated On: 11/28/2017 9:52 AM Scope Withdrawal Time: 1 hour 15 minutes 20 seconds  Total Procedure Duration: 1 hour 23 minutes 17 seconds       [facility]

## 2017-11-28 NOTE — Anesthesia Postprocedure Evaluation (Signed)
Anesthesia Post Note  Patient: Julie Beck  Procedure(s) Performed: COLONOSCOPY WITH PROPOFOL (N/A )  Patient location during evaluation: Endoscopy Anesthesia Type: General Level of consciousness: awake and alert Pain management: pain level controlled Vital Signs Assessment: post-procedure vital signs reviewed and stable Respiratory status: spontaneous breathing, nonlabored ventilation, respiratory function stable and patient connected to nasal cannula oxygen Cardiovascular status: blood pressure returned to baseline and stable Postop Assessment: no apparent nausea or vomiting Anesthetic complications: no     Last Vitals:  Vitals:   11/28/17 1150 11/28/17 1200  BP:  138/71  Pulse: 73 71  Resp: 16 19  Temp:    SpO2: 98% 98%    Last Pain:  Vitals:   11/28/17 1200  TempSrc:   PainSc: 0-No pain                 Martha Clan

## 2017-11-29 ENCOUNTER — Encounter: Payer: Self-pay | Admitting: Gastroenterology

## 2017-11-29 LAB — SURGICAL PATHOLOGY

## 2017-12-08 ENCOUNTER — Other Ambulatory Visit: Payer: Self-pay

## 2017-12-08 DIAGNOSIS — E7849 Other hyperlipidemia: Secondary | ICD-10-CM

## 2017-12-08 DIAGNOSIS — I2510 Atherosclerotic heart disease of native coronary artery without angina pectoris: Secondary | ICD-10-CM | POA: Diagnosis not present

## 2017-12-08 LAB — LIPID PANEL
CHOLESTEROL: 148 mg/dL (ref <200)
HDL: 44 mg/dL — ABNORMAL LOW (ref >50)
LDL Cholesterol (Calc): 90 mg/dL (calc)
Non-HDL Cholesterol (Calc): 104 mg/dL (calc) (ref <130)
Total CHOL/HDL Ratio: 3.4 (calc) (ref <5.0)
Triglycerides: 57 mg/dL (ref <150)

## 2017-12-14 ENCOUNTER — Other Ambulatory Visit: Payer: Self-pay | Admitting: Family Medicine

## 2017-12-14 ENCOUNTER — Encounter: Payer: Self-pay | Admitting: Gastroenterology

## 2017-12-14 ENCOUNTER — Ambulatory Visit: Payer: Medicare Other | Admitting: Gastroenterology

## 2017-12-14 VITALS — BP 157/75 | HR 65 | Resp 16 | Ht 68.0 in | Wt 150.6 lb

## 2017-12-14 DIAGNOSIS — D126 Benign neoplasm of colon, unspecified: Secondary | ICD-10-CM | POA: Diagnosis not present

## 2017-12-14 MED ORDER — EZETIMIBE 10 MG PO TABS: 10.0000 mg | ORAL_TABLET | Freq: Every day | ORAL | 3 refills | Status: DC

## 2017-12-14 NOTE — Progress Notes (Signed)
Cephas Darby, MD [address]  [place]  [place], [place] [address]  Main: [phone]  Fax: [phone] Pager: [phone]   Primary Care Physician: Arnetha Courser, MD  Primary Gastroenterologist:  Dr. Cephas Darby  Chief Complaint  Patient presents with  . Follow-up    colonoscopy results    HPI: Julie Beck is a 72 y.o. female because significant past medical history here to discuss colonoscopy results. She underwent colonoscopy on 11/28/2017 after a positive colo-guard test and was found to have several flat polyps that were resected by EMR technique. Pathology was consistent with tubular adenoma with no evidence of high-grade dysplasia. She denies any complaints today  Colonoscopy 11/28/2017 - Hemorrhoids found on perianal exam. - One 6 mm polyp in the cecum, removed with mucosal resection. Resected and retrieved. - One diminutive polyp in the cecum, removed with a cold biopsy forceps. Resected and retrieved. - One 7 mm polyp in the ascending colon, removed with mucosal resection. Resected and retrieved. - One 12 mm polyp in the mid transverse colon, removed with mucosal resection. Resected and retrieved. - One 10 mm polyp in the mid transverse colon, removed with mucosal resection. Incomplete resection. Resected tissue retrieved. Tattooed. - One 7 mm polyp in the sigmoid colon, removed with a hot snare. Resected and retrieved. - The distal rectum and anal verge are normal on retroflexion view. DIAGNOSIS:  A. COLON POLYP, CECUM; COLD SNARE X1/COLD BIOPSY X1:  - TUBULAR ADENOMAS (2).  - NEGATIVE FOR HIGH-GRADE DYSPLASIA AND MALIGNANCY.   B. COLON POLYP X2, ASCENDING; HOT SNARE:  - HYPERPLASTIC POLYPS (2).  - NEGATIVE FOR DYSPLASIA AND MALIGNANCY.   C. COLON POLYP, TRANSVERSE; HOT SNARE:  - SESSILE SERRATED ADENOMA.  - NEGATIVE FOR CYTOLOGIC DYSPLASIA AND MALIGNANCY.   D. COLON POLYP, TRANSVERSE; HOT SNARE:  - SESSILE SERRATED ADENOMA.  -  NEGATIVE FOR CYTOLOGIC DYSPLASIA AND MALIGNANCY.   E. COLON POLYP, SIGMOID; HOT SNARE:  - HYPERPLASTIC POLYP.  - NEGATIVE FOR DYSPLASIA AND MALIGNANCY.    Current Outpatient Medications  Medication Sig Dispense Refill  . aspirin EC 81 MG tablet Take 1 tablet (81 mg total) by mouth daily.    Marland Kitchen atorvastatin (LIPITOR) 80 MG tablet Take 1 tablet (80 mg total) by mouth at bedtime. 90 tablet 3  . calcium-vitamin D (OSCAL WITH D) 500-200 MG-UNIT tablet Take 1 tablet by mouth every morning.    . ezetimibe (ZETIA) 10 MG tablet Take 1 tablet (10 mg total) by mouth daily. 90 tablet 3  . Multiple Vitamin (MULTIVITAMIN) tablet Take 1 tablet by mouth daily.    Marland Kitchen omeprazole (PRILOSEC) 20 MG capsule TAKE 1 CAPSULE BY MOUTH DAILY     No current facility-administered medications for this visit.     Allergies as of 12/14/2017 - Review Complete 12/14/2017  Allergen Reaction Noted  . Sulfa antibiotics Hives 11/10/2013   ROS:  General: Negative for anorexia, weight loss, fever, chills, fatigue, weakness. ENT: Negative for hoarseness, difficulty swallowing , nasal congestion. CV: Negative for chest pain, angina, palpitations, dyspnea on exertion, peripheral edema.  Respiratory: Negative for dyspnea at rest, dyspnea on exertion, cough, sputum, wheezing.  GI: See history of present illness. GU:  Negative for dysuria, hematuria, urinary incontinence, urinary frequency, nocturnal urination.  Endo: Negative for unusual weight change.    Physical Examination:   BP (!) 157/75 (BP Location: Left Arm, Patient Position: Sitting, Cuff Size: Normal)   Pulse 65   Resp 16   Ht  5\' 8"  (1.727 m)   Wt 150 lb 9.6 oz (68.3 kg)   BMI 22.90 kg/m   General: Well-nourished, well-developed in no acute distress.  Eyes: No icterus. Conjunctivae pink. Mouth: Oropharyngeal mucosa moist and pink , no lesions erythema or exudate. Lungs: Clear to auscultation bilaterally. Non-labored. Heart: Regular rate and rhythm, no  murmurs rubs or gallops.  Abdomen: Bowel sounds are normal, nontender, nondistended, no hepatosplenomegaly or masses, no hernia , no rebound or guarding.   Extremities: No lower extremity edema. No clubbing or deformities. Neuro: Alert and oriented x 3.  Grossly intact. Skin: Warm and dry, no jaundice.   Psych: Alert and cooperative, normal mood and affect.   Imaging Studies: No results found.  Assessment and Plan:   Julie Beck is a 72 y.o. female with no significant past medical history,chronic tobacco use, with recent colonoscopy for a positive Cologaurd test, found to have several adenomatous polyps, that were dissected. Some of the large polyps were removed in piecemeal. Therefore, I recommend surveillance colonoscopy in 1 year  Follow up in one year   Dr Sherri Sear, MD

## 2017-12-14 NOTE — Progress Notes (Signed)
Add zetia to statin

## 2018-04-17 ENCOUNTER — Ambulatory Visit: Payer: Medicare Other | Admitting: Family Medicine

## 2018-04-19 ENCOUNTER — Encounter: Payer: Self-pay | Admitting: Family Medicine

## 2018-04-19 ENCOUNTER — Ambulatory Visit (INDEPENDENT_AMBULATORY_CARE_PROVIDER_SITE_OTHER): Payer: Medicare Other | Admitting: Family Medicine

## 2018-04-19 VITALS — BP 116/62 | HR 91 | Temp 98.2°F | Ht 68.0 in | Wt 151.4 lb

## 2018-04-19 DIAGNOSIS — I700 Atherosclerosis of aorta: Secondary | ICD-10-CM | POA: Diagnosis not present

## 2018-04-19 DIAGNOSIS — E7849 Other hyperlipidemia: Secondary | ICD-10-CM | POA: Diagnosis not present

## 2018-04-19 DIAGNOSIS — K219 Gastro-esophageal reflux disease without esophagitis: Secondary | ICD-10-CM | POA: Diagnosis not present

## 2018-04-19 DIAGNOSIS — I2510 Atherosclerotic heart disease of native coronary artery without angina pectoris: Secondary | ICD-10-CM

## 2018-04-19 NOTE — Assessment & Plan Note (Signed)
Goal LDL is less than 70; not fasting today but normal TG last time so not an issue; limit saturated fats

## 2018-04-19 NOTE — Progress Notes (Signed)
BP 116/62   Pulse 91   Temp 98.2 F (36.8 C) (Oral)   Ht 5\' 8"  (1.727 m)   Wt 151 lb 6.4 oz (68.7 kg)   SpO2 98%   BMI 23.02 kg/m    Subjective:    Patient ID: Julie Beck, female    DOB: June 30, 1945, 72 y.o.   MRN: [id number]  HPI: Julie Beck is a 72 y.o. female  Chief Complaint  Patient presents with  . Follow-up    HPI Patient is here for follow-up  High cholesterol; on statin and zetia Not many hot dogs or bologna; seldom eats bacon; just a treat; does like cheese; seldom has eggs; usually has oatmeal; an egg every two weeks  Lab Results  Component Value Date   CHOL 133 04/19/2018   HDL 46 (L) 04/19/2018   LDLCALC 72 04/19/2018   TRIG 72 04/19/2018   CHOLHDL 2.9 04/19/2018   Coronary artery disease; no chest pain; ran out of aspirin The 10-year ASCVD risk score Mikey Bussing DC Jr., et al., 2013) is: 11.6%   Values used to calculate the score:     Age: 67 years     Sex: Female     Is Non-Hispanic African American: No     Diabetic: No     Tobacco smoker: Yes     Systolic Blood Pressure: 093 mmHg     Is BP treated: No     HDL Cholesterol: 46 mg/dL     Total Cholesterol: 133 mg/dL  [employer] aspirin guide used, aspirin NOT advised  GERD; on omeprazole; note reviewed from 2017, GERD and heartburn; they started omeprazole; she stopped taking that a long time ago, when the Rx ran out; no ongoing epigastric pain; no blood in the stool  Depression screen [facility] 2/9 04/19/2018 10/27/2017 10/06/2017 07/12/2016 05/12/2016  Decreased Interest 0 0 0 0 0  Down, Depressed, Hopeless 0 0 0 0 0  PHQ - 2 Score 0 0 0 0 0  Altered sleeping 0 - 0 - -  Tired, decreased energy 0 - 0 - -  Change in appetite 0 - 0 - -  Feeling bad or failure about yourself  0 - 0 - -  Trouble concentrating 0 - 0 - -  Moving slowly or fidgety/restless 0 - 0 - -  Suicidal thoughts 0 - 0 - -  PHQ-9 Score 0 - 0 - -  Difficult doing work/chores Not difficult at all - Not difficult at all - -   Fall Risk   04/19/2018 10/27/2017 10/06/2017 07/12/2016 05/12/2016  Falls in the past year? 0 No No No No  Risk for fall due to : - - Impaired vision - -  Risk for fall due to: Comment - - wears eyeglasses - -    Relevant past medical, surgical, family and social history reviewed Past Medical History:  Diagnosis Date  . Abnormal CT of liver 07/12/2016   Chest CT Feb 2018  . Arthritis   . Centrilobular emphysema ([place]) 07/12/2016   Chest CT Feb 2018  . Coronary artery disease 07/12/2016   Followed by Dr. Saunders Revel, seen on chest CT Feb 2018  . Epigastric abdominal pain 03/17/2016  . Hyperlipidemia   . Overweight (BMI 25.0-29.9) 03/17/2016  . Thoracic aortic atherosclerosis ([place]) 07/12/2016   Chest CT scan Feb 2018  . Tobacco abuse 03/17/2016   Past Surgical History:  Procedure Laterality Date  . COLONOSCOPY WITH PROPOFOL N/A 11/28/2017   Procedure: COLONOSCOPY WITH PROPOFOL;  Surgeon: Lin Landsman, MD;  Location: [facility] ENDOSCOPY;  Service: Gastroenterology;  Laterality: N/A;  . INGUINAL HERNIA REPAIR Bilateral   . KNEE ARTHROSCOPY Bilateral   . TUBAL LIGATION     Family History  Problem Relation Age of Onset  . Alzheimer's disease Mother   . Breast cancer Mother   . Cancer Father        lung  . Dementia Sister   . Cancer Maternal Grandmother        breast  . Breast cancer Maternal Grandmother   . Heart attack Maternal Grandfather   . Healthy Sister   . Breast cancer Maternal Aunt   . Bladder Cancer Neg Hx   . Kidney cancer Neg Hx    Social History   Tobacco Use  . Smoking status: Former Smoker    Packs/day: 0.25    Years: 52.00    Pack years: 13.00    Types: Cigarettes    Last attempt to quit: 03/20/2018    Years since quitting: 0.1  . Smokeless tobacco: Never Used  Substance Use Topics  . Alcohol use: Yes    Comment: socially  . Drug use: No     Office Visit from 04/19/2018 in [facility]  AUDIT-C Score  1      Interim medical history since last visit  reviewed. Allergies and medications reviewed  Review of Systems Per HPI unless specifically indicated above     Objective:    BP 116/62   Pulse 91   Temp 98.2 F (36.8 C) (Oral)   Ht 5\' 8"  (1.727 m)   Wt 151 lb 6.4 oz (68.7 kg)   SpO2 98%   BMI 23.02 kg/m   Wt Readings from Last 3 Encounters:  04/19/18 151 lb 6.4 oz (68.7 kg)  12/14/17 150 lb 9.6 oz (68.3 kg)  11/28/17 154 lb (69.9 kg)    Physical Exam Constitutional:      General: She is not in acute distress.    Appearance: She is well-developed. She is not diaphoretic.  HENT:     Head: Normocephalic and atraumatic.  Eyes:     General: No scleral icterus. Neck:     Thyroid: No thyromegaly.  Cardiovascular:     Rate and Rhythm: Normal rate and regular rhythm.     Heart sounds: Normal heart sounds. No murmur.  Pulmonary:     Effort: Pulmonary effort is normal. No respiratory distress.     Breath sounds: Normal breath sounds. No wheezing.  Abdominal:     General: Bowel sounds are normal. There is no distension.     Palpations: Abdomen is soft.  Skin:    General: Skin is warm and dry.     Coloration: Skin is not pale.  Neurological:     Mental Status: She is alert.  Psychiatric:        Behavior: Behavior normal.        Thought Content: Thought content normal.        Judgment: Judgment normal.       Assessment & Plan:   Problem List Items Addressed This Visit      Cardiovascular and Mediastinum   Thoracic aortic atherosclerosis (HCC) (Chronic)    Goal LDL is less than 70; not fasting today but normal TG last time so not an issue; limit saturated fats      Relevant Orders   Lipid panel (Completed)   Coronary artery disease - Primary (Chronic)    Followed by cardiologist;  goal LDL less than 70; continue statin; initially we did the [employer] aspirin guide which recommended no aspirin; reviewed the chart further; no recent visit to Dr. Saunders Revel; I actually called her back (addenum here 04/29/18) and encouraged her  to get back on her aspirin for cardioprotection until we hear from Dr. Saunders Revel that he does not want her on aspirin      Relevant Orders   Lipid panel (Completed)     Digestive   GERD (gastroesophageal reflux disease)    No red flags; doing well without PPI at this time        Other   Hyperlipidemia, familial, high LDL (Chronic)    LDL was 224 in November of 207, so no doubt this is familial; continue statin plus zetia; try to limit intake of saturated fats, though I am aware this will make a small dent on her LDL relatlively speaking; goal LDL lessl than 70          Follow up plan: No follow-ups on file.  An after-visit summary was printed and given to the patient at [place].  Please see the patient instructions which may contain other information and recommendations beyond what is mentioned above in the assessment and plan.  No orders of the defined types were placed in this encounter.   Orders Placed This Encounter  Procedures  . Lipid panel

## 2018-04-19 NOTE — Patient Instructions (Addendum)
Try to follow the DASH guidelines (DASH stands for Dietary Approaches to Stop Hypertension). Try to limit the sodium in your diet to no more than 1,500mg  of sodium per day. Certainly try to not exceed 2,000 mg per day at the very most. Do not add salt when cooking or at the table.  Check the sodium amount on labels when shopping, and choose items lower in sodium when given a choice. Avoid or limit foods that already contain a lot of sodium. Eat a diet rich in fruits and vegetables and whole grains, and try to lose weight if overweight or obese  Try to limit saturated fats in your diet (bologna, hot dogs, barbeque, cheeseburgers, hamburgers, steak, bacon, sausage, cheese, etc.) and get more fresh fruits, vegetables, and whole grains   Cholesterol Cholesterol is a fat. Your body needs a small amount of cholesterol. Cholesterol (plaque) may build up in your blood vessels (arteries). That makes you more likely to have a heart attack or stroke. You cannot feel your cholesterol level. Having a blood test is the only way to find out if your level is high. Keep your test results. Work with your doctor to keep your cholesterol at a good level. What do the results mean?  Total cholesterol is how much cholesterol is in your blood.  LDL is bad cholesterol. This is the type that can build up. Try to have low LDL.  HDL is good cholesterol. It cleans your blood vessels and carries LDL away. Try to have high HDL.  Triglycerides are fat that the body can store or burn for energy. What are good levels of cholesterol?  Total cholesterol below 200.  LDL below 100 is good for people who have health risks. LDL below 70 is good for people who have very high risks.  HDL above 40 is good. It is best to have HDL of 60 or higher.  Triglycerides below 150. How can I lower my cholesterol? Diet Follow your diet program as told by your doctor.  Choose fish, white meat chicken, or [place] that is roasted or baked. Try  not to eat red meat, fried foods, sausage, or lunch meats.  Eat lots of fresh fruits and vegetables.  Choose whole grains, beans, pasta, potatoes, and cereals.  Choose olive oil, corn oil, or canola oil. Only use small amounts.  Try not to eat butter, mayonnaise, shortening, or palm kernel oils.  Try not to eat foods with trans fats.  Choose low-fat or nonfat dairy foods. ? Drink skim or nonfat milk. ? Eat low-fat or nonfat yogurt and cheeses. ? Try not to drink whole milk or cream. ? Try not to eat ice cream, egg yolks, or full-fat cheeses.  Healthy desserts include angel food cake, ginger snaps, animal crackers, hard candy, popsicles, and low-fat or nonfat frozen yogurt. Try not to eat pastries, cakes, pies, and cookies.  Exercise Follow your exercise program as told by your doctor.  Be more active. Try gardening, walking, and taking the stairs.  Ask your doctor about ways that you can be more active.  Medicine  Take over-the-counter and prescription medicines only as told by your doctor. This information is not intended to replace advice given to you by your health care provider. Make sure you discuss any questions you have with your health care provider. Document Released: 07/30/2008 Document Revised: 12/03/2015 Document Reviewed: 11/13/2015 Elsevier Interactive Patient Education  [employer].

## 2018-04-20 LAB — LIPID PANEL
CHOLESTEROL: 133 mg/dL (ref <200)
HDL: 46 mg/dL — AB (ref >50)
LDL CHOLESTEROL (CALC): 72 mg/dL
Non-HDL Cholesterol (Calc): 87 mg/dL (calc) (ref <130)
TRIGLYCERIDES: 72 mg/dL (ref <150)
Total CHOL/HDL Ratio: 2.9 (calc) (ref <5.0)

## 2018-04-20 NOTE — Progress Notes (Signed)
Julie Beck, please let the patient know that her cholesterol has improved since last time; her total dropped, her HDL got better, and her LDL came down 18 points; we are almost to goal; I'd love her LDL to be less than 70 Is she willing to try a little harder with her diet to get that LDL down under 70 OR does she want to add another medicine? Thank you

## 2018-04-29 NOTE — Assessment & Plan Note (Signed)
LDL was 224 in November of 207, so no doubt this is familial; continue statin plus zetia; try to limit intake of saturated fats, though I am aware this will make a small dent on her LDL relatlively speaking; goal LDL lessl than 70

## 2018-04-29 NOTE — Assessment & Plan Note (Addendum)
Followed by cardiologist; goal LDL less than 70; continue statin; initially we did the [employer] aspirin guide which recommended no aspirin; reviewed the chart further; no recent visit to Dr. Saunders Revel; I actually called her back (addenum here 04/29/18) and encouraged her to get back on her aspirin for cardioprotection until we hear from Dr. Saunders Revel that he does not want her on aspirin

## 2018-04-29 NOTE — Assessment & Plan Note (Signed)
No red flags; doing well without PPI at this time

## 2018-05-01 ENCOUNTER — Telehealth: Payer: Self-pay | Admitting: Family Medicine

## 2018-05-01 NOTE — Telephone Encounter (Signed)
Dr. Saunders Revel responded back to me about the aspirin question Please let the patient know that he thinks it is reasonable to STOP her daily aspirin for primary cardiac prevention; we'll stick with what the [employer] aspirin guide recommended

## 2018-05-01 NOTE — Telephone Encounter (Signed)
Pt.notified

## 2018-06-02 ENCOUNTER — Telehealth: Payer: Self-pay

## 2018-06-02 NOTE — Telephone Encounter (Signed)
Call pt regarding lung screening. Left message for pt to return call.  

## 2018-06-06 ENCOUNTER — Telehealth: Payer: Self-pay | Admitting: *Deleted

## 2018-06-06 NOTE — Telephone Encounter (Signed)
Attempted to contact patient r/t LDCT Screening follow up due at this time.  No answer received, message left for patient to call [phone] to schedule appointment.    

## 2018-06-07 ENCOUNTER — Telehealth: Payer: Self-pay | Admitting: *Deleted

## 2018-06-07 ENCOUNTER — Encounter: Payer: Self-pay | Admitting: *Deleted

## 2018-06-07 DIAGNOSIS — Z122 Encounter for screening for malignant neoplasm of respiratory organs: Secondary | ICD-10-CM

## 2018-06-07 NOTE — Telephone Encounter (Signed)
Patient has been notified that the annual lung cancer screening low dose CT scan is due currently or will be in the near future.  Confirmed that the patient is within the age range of 62-80, and asymptomatic, and currently exhibits no signs or symptoms of lung cancer.  Patient denies illness that would prevent curative treatment for lung cancer if found.  Verified smoking history, CURRENT SMOKER 34PKYR HISTORY.  The shared decision making visit was completed on 06-09-16.  Patient is agreeable for the CT scan to be scheduled.  Will call patient back with date and time of appointment.

## 2018-06-07 NOTE — Telephone Encounter (Signed)
Called pt to inform her of her appt for ldct screening on Monday 06/19/2018 here @ [facility] @ 9:10am, voiced understanding.

## 2018-06-13 ENCOUNTER — Telehealth: Payer: Self-pay | Admitting: *Deleted

## 2018-06-13 NOTE — Telephone Encounter (Signed)
Called pt to inform her of her appt for ldct screening on Wednesday 06/21/2018 @ 4:30pm here @ [facility], message left for patient appt mailed to address in emr.

## 2018-06-19 ENCOUNTER — Ambulatory Visit: Payer: Medicare Other

## 2018-06-21 ENCOUNTER — Ambulatory Visit
Admission: RE | Admit: 2018-06-21 | Discharge: 2018-06-21 | Disposition: A | Discharge status: Home / Self Care | Payer: Commercial Managed Care - HMO | Source: Ambulatory Visit | Attending: Oncology | Admitting: Oncology

## 2018-06-21 DIAGNOSIS — F17210 Nicotine dependence, cigarettes, uncomplicated: Secondary | ICD-10-CM | POA: Diagnosis not present

## 2018-06-21 DIAGNOSIS — R911 Solitary pulmonary nodule: Secondary | ICD-10-CM | POA: Insufficient documentation

## 2018-06-21 DIAGNOSIS — I700 Atherosclerosis of aorta: Secondary | ICD-10-CM | POA: Diagnosis not present

## 2018-06-21 DIAGNOSIS — Z122 Encounter for screening for malignant neoplasm of respiratory organs: Secondary | ICD-10-CM | POA: Diagnosis not present

## 2018-06-21 DIAGNOSIS — J432 Centrilobular emphysema: Secondary | ICD-10-CM | POA: Diagnosis not present

## 2018-06-21 DIAGNOSIS — I2510 Atherosclerotic heart disease of native coronary artery without angina pectoris: Secondary | ICD-10-CM | POA: Diagnosis not present

## 2018-06-22 ENCOUNTER — Encounter: Payer: Self-pay | Admitting: *Deleted

## 2018-06-23 ENCOUNTER — Encounter: Payer: Self-pay | Admitting: *Deleted

## 2018-08-03 ENCOUNTER — Other Ambulatory Visit: Payer: Self-pay | Admitting: Family Medicine

## 2018-08-03 DIAGNOSIS — Z1231 Encounter for screening mammogram for malignant neoplasm of breast: Secondary | ICD-10-CM

## 2018-11-16 ENCOUNTER — Ambulatory Visit (INDEPENDENT_AMBULATORY_CARE_PROVIDER_SITE_OTHER): Payer: Medicare Other

## 2018-11-16 ENCOUNTER — Other Ambulatory Visit: Payer: Self-pay

## 2018-11-16 VITALS — BP 128/76 | HR 64 | Temp 98.2°F | Resp 16 | Ht 68.0 in | Wt 142.5 lb

## 2018-11-16 DIAGNOSIS — Z0000 Encounter for general adult medical examination without abnormal findings: Secondary | ICD-10-CM

## 2018-11-16 NOTE — Progress Notes (Signed)
Subjective:   Julie Beck is a 73 y.o. female who presents for Medicare Annual (Subsequent) preventive examination.  Review of Systems: Cardiac Risk Factors include: advanced age (>9men, >74 women);dyslipidemia     Objective:     Vitals: BP 128/76 (BP Location: Right Arm, Patient Position: Sitting, Cuff Size: Normal)   Pulse 64   Temp 98.2 F (36.8 C) (Oral)   Resp 16   Ht 5\' 8"  (1.727 m)   Wt 142 lb 8 oz (64.6 kg)   SpO2 95%   BMI 21.67 kg/m   Body mass index is 21.67 kg/m.  Advanced Directives 11/16/2018 11/28/2017 10/06/2017 11/18/2016 11/15/2016 11/11/2016 07/12/2016  Does Patient Have a Medical Advance Directive? Yes Yes Yes No No No Yes  Type of Advance Directive Living will Living will Living will - - - -  Does patient want to make changes to medical advance directive? Yes (MAU/Ambulatory/Procedural Areas - Information given) - Yes (MAU/Ambulatory/Procedural Areas - Information given) - - - -  Copy of [place] in Chart? - - - - - - -    Tobacco Social History   Tobacco Use  Smoking Status Current Some Day Smoker  . Packs/day: 0.25  . Years: 52.00  . Pack years: 13.00  . Types: Cigarettes  . Last attempt to quit: 03/20/2018  . Years since quitting: 0.6  Smokeless Tobacco Never Used     Ready to quit: Not Answered Counseling given: Not Answered   Clinical Intake:  Pre-visit preparation completed: Yes  Pain : No/denies pain     BMI - recorded: 21.67 Nutritional Status: BMI of 19-24  Normal Nutritional Risks: None Diabetes: No  How often do you need to have someone help you when you read instructions, pamphlets, or other written materials from your doctor or pharmacy?: 1 - Never  Interpreter Needed?: No  Information entered by :: Julie Marker LPN  Past Medical History:  Diagnosis Date  . Abnormal CT of liver 07/12/2016   Chest CT Feb 2018  . Arthritis   . Centrilobular emphysema ([place]) 07/12/2016   Chest CT Feb 2018  . Coronary  artery disease 07/12/2016   Followed by Dr. Saunders Revel, seen on chest CT Feb 2018  . Epigastric abdominal pain 03/17/2016  . Hyperlipidemia   . Overweight (BMI 25.0-29.9) 03/17/2016  . Thoracic aortic atherosclerosis ([place]) 07/12/2016   Chest CT scan Feb 2018  . Tobacco abuse 03/17/2016   Past Surgical History:  Procedure Laterality Date  . COLONOSCOPY WITH PROPOFOL N/A 11/28/2017   Procedure: COLONOSCOPY WITH PROPOFOL;  Surgeon: Lin Landsman, MD;  Location: [facility] ENDOSCOPY;  Service: Gastroenterology;  Laterality: N/A;  . INGUINAL HERNIA REPAIR Bilateral   . KNEE ARTHROSCOPY Bilateral   . TUBAL LIGATION     Family History  Problem Relation Age of Onset  . Alzheimer's disease Mother   . Breast cancer Mother   . Cancer Father        lung  . Dementia Sister   . Cancer Maternal Grandmother        breast  . Breast cancer Maternal Grandmother   . Heart attack Maternal Grandfather   . Healthy Sister   . Breast cancer Maternal Aunt   . Bladder Cancer Neg Hx   . Kidney cancer Neg Hx    Social History   Socioeconomic History  . Marital status: Divorced    Spouse name: Not on file  . Number of children: 2  . Years of education: some college  .  Highest education level: 12th grade  Occupational History    Employer: OTHER    Comment: hairdresser  Social Needs  . Financial resource strain: Not hard at all  . Food insecurity    Worry: Never true    Inability: Never true  . Transportation needs    Medical: No    Non-medical: No  Tobacco Use  . Smoking status: Current Some Day Smoker    Packs/day: 0.25    Years: 52.00    Pack years: 13.00    Types: Cigarettes    Last attempt to quit: 03/20/2018    Years since quitting: 0.6  . Smokeless tobacco: Never Used  Substance and Sexual Activity  . Alcohol use: Yes    Comment: socially  . Drug use: No  . Sexual activity: Not Currently    Partners: Female  Lifestyle  . Physical activity    Days per week: 7 days    Minutes per  session: 30 min  . Stress: Not at all  Relationships  . Social connections    Talks on phone: More than three times a week    Gets together: More than three times a week    Attends religious service: More than 4 times per year    Active member of club or organization: No    Attends meetings of clubs or organizations: Never    Relationship status: Divorced  Other Topics Concern  . Not on file  Social History Narrative  . Not on file    Outpatient Encounter Medications as of 11/16/2018  Medication Sig  . atorvastatin (LIPITOR) 80 MG tablet Take 1 tablet (80 mg total) by mouth at bedtime.  . calcium-vitamin D (OSCAL WITH D) 500-200 MG-UNIT tablet Take 1 tablet by mouth every morning.  . ezetimibe (ZETIA) 10 MG tablet Take 1 tablet (10 mg total) by mouth daily.  . Multiple Vitamin (MULTIVITAMIN) tablet Take 1 tablet by mouth daily.   No facility-administered encounter medications on file as of 11/16/2018.     Activities of Daily Living In your present state of health, do you have any difficulty performing the following activities: 11/16/2018 04/19/2018  Hearing? N N  Comment declines hearing aids -  Vision? N N  Comment wears glasses -  Difficulty concentrating or making decisions? N N  Walking or climbing stairs? N N  Dressing or bathing? N N  Doing errands, shopping? N N  Preparing Food and eating ? N -  Using the Toilet? N -  In the past six months, have you accidently leaked urine? N -  Do you have problems with loss of bowel control? N -  Managing your Medications? N -  Managing your Finances? N -  Housekeeping or managing your Housekeeping? N -  Some recent data might be hidden    Patient Care Team: Lada, Satira Anis, MD as PCP - General (Family Medicine)    Assessment:   This is a routine wellness examination for Julie Beck.  Exercise Activities and Dietary recommendations Current Exercise Habits: Home exercise routine, Type of exercise: walking, Time (Minutes): 30,  Frequency (Times/Week): 7, Weekly Exercise (Minutes/Week): 210, Intensity: Mild, Exercise limited by: None identified  Goals    . DIET - INCREASE WATER INTAKE     Recommend to drink at least 6-8 8oz glasses of water per day.       Fall Risk Fall Risk  11/16/2018 04/19/2018 10/27/2017 10/06/2017 07/12/2016  Falls in the past year? 0 0 No No No  Number  falls in past yr: 0 - - - -  Injury with Fall? 0 - - - -  Risk for fall due to : - - - Impaired vision -  Risk for fall due to: Comment - - - wears eyeglasses -  Follow up Falls prevention discussed - - - -   FALL RISK PREVENTION PERTAINING TO THE HOME:  Any stairs in or around the home? Yes  If so, do they handrails? Yes   Home free of loose throw rugs in walkways, pet beds, electrical cords, etc? Yes  Adequate lighting in your home to reduce risk of falls? Yes   ASSISTIVE DEVICES UTILIZED TO PREVENT FALLS:  Life alert? No  Use of a cane, walker or w/c? No  Grab bars in the bathroom? No  Shower chair or bench in shower? Yes  Elevated toilet seat or a handicapped toilet? Yes   DME ORDERS:  DME order needed?  No   TIMED UP AND GO:  Was the test performed? Yes .  Length of time to ambulate 10 feet: 5 sec.   GAIT:  Appearance of gait: Gait stead-fast and without the use of an assistive device.    Education: Fall risk prevention has been discussed.  Intervention(s) required? No   Depression Screen PHQ 2/9 Scores 11/16/2018 04/19/2018 10/27/2017 10/06/2017  PHQ - 2 Score 0 0 0 0  PHQ- 9 Score - 0 - 0     Cognitive Function     6CIT Screen 11/16/2018 10/06/2017  What Year? 0 points 0 points  What month? 0 points 0 points  What time? 0 points 0 points  Count back from 20 0 points 0 points  Months in reverse 0 points 0 points  Repeat phrase 0 points 0 points  Total Score 0 0    Immunization History  Administered Date(s) Administered  . Influenza-Unspecified 06/23/2014  . Pneumococcal Conjugate-13 05/29/2014  . Tdap  06/23/2014    Qualifies for Shingles Vaccine? Yes . Due for Shingrix. Education has been provided regarding the importance of this vaccine. Pt has been advised to call insurance company to determine out of pocket expense. Advised may also receive vaccine at local pharmacy or Health Dept. Verbalized acceptance and understanding.  Tdap: Up to date  Flu Vaccine: Due for Flu vaccine. Does the patient want to receive this vaccine today?  No . Education has been provided regarding the importance of this vaccine but still declined. Advised may receive this vaccine at local pharmacy or Health Dept. Aware to provide a copy of the vaccination record if obtained from local pharmacy or Health Dept. Verbalized acceptance and understanding.  Pneumococcal Vaccine: Due for Pneumococcal vaccine. Does the patient want to receive this vaccine today?  No . Education has been provided regarding the importance of this vaccine but still declined. Advised may receive this vaccine at local pharmacy or Health Dept. Aware to provide a copy of the vaccination record if obtained from local pharmacy or Health Dept. Verbalized acceptance and understanding.   Screening Tests Health Maintenance  Topic Date Due  . PNA vac Low Risk Adult (2 of 2 - PPSV23) 05/30/2015  . DEXA SCAN  04/22/2016  . MAMMOGRAM  06/15/2018  . COLONOSCOPY  11/29/2018  . INFLUENZA VACCINE  12/16/2018  . TETANUS/TDAP  06/23/2024  . Hepatitis C Screening  Completed    Cancer Screenings:  Colorectal Screening: Completed 11/28/17. Repeat every years; Pt aware to contact GI to follow up for repeat screening colonoscopy.   Mammogram:  Completed 06/15/17. Repeat every year; Scheduled for 11/27/18.  Bone Density: Completed 04/22/14. Results reflect OSTEOPENIA. Repeat every 2 years. Pt declined repeat screening at this time.   Lung Cancer Screening: (Low Dose CT Chest recommended if Age 31-80 years, 30 pack-year currently smoking OR have quit w/in 15years.)  does not qualify. Screening done 06/21/18.  Additional Screening:  Hepatitis C Screening: does qualify; Completed 07/12/16.  Vision Screening: Recommended annual ophthalmology exams for early detection of glaucoma and other disorders of the eye. Is the patient up to date with their annual eye exam?  No  Who is the provider or what is the name of the office in which the pt attends annual eye exams? [facility]  Dental Screening: Recommended annual dental exams for proper oral hygiene  Community Resource Referral:  CRR required this visit?  No      Plan:     I have personally reviewed and addressed the Medicare Annual Wellness questionnaire and have noted the following in the patient's chart:  A. Medical and social history B. Use of alcohol, tobacco or illicit drugs  C. Current medications and supplements D. Functional ability and status E.  Nutritional status F.  Physical activity G. Advance directives H. List of other physicians I.  Hospitalizations, surgeries, and ER visits in previous 12 months J.  [place] such as hearing and vision if needed, cognitive and depression L. Referrals and appointments   In addition, I have reviewed and discussed with patient certain preventive protocols, quality metrics, and best practice recommendations. A written personalized care plan for preventive services as well as general preventive health recommendations were provided to patient.   Signed,  Julie Marker, LPN Nurse Health Advisor   Nurse Notes: pt doing well and appreciative of visit today. Advised pt to schedule follow up office visit with Raelyn Ensign NP for routine labs.

## 2018-11-16 NOTE — Patient Instructions (Signed)
Julie Beck , Thank you for taking time to come for your Medicare Wellness Visit. I appreciate your ongoing commitment to your health goals. Please review the following plan we discussed and let me know if I can assist you in the future.   Screening recommendations/referrals: Colonoscopy: done 11/28/17. Please contact GI for repeat screening colonoscopy. Mammogram: done 06/15/17. Scheduled for 11/27/18 Bone Density: done 04/03/14 Recommended yearly ophthalmology/optometry visit for glaucoma screening and checkup Recommended yearly dental visit for hygiene and checkup  Vaccinations: Influenza vaccine: postponed Pneumococcal vaccine: done 05/29/14 Tdap vaccine: done 06/23/14 Shingles vaccine: Shingrix discussed. Please contact your pharmacy for coverage information.   Advanced directives: Advance directive discussed with you today. I have provided a copy for you to complete at home and have notarized. Once this is complete please bring a copy in to our office so we can scan it into your chart.  Conditions/risks identified: Keep up the great work!  Next appointment: Please follow up in one year for your Medicare Annual Wellness visit.     Preventive Care 71 Years and Older, Female Preventive care refers to lifestyle choices and visits with your health care provider that can promote health and wellness. What does preventive care include?  A yearly physical exam. This is also called an annual well check.  Dental exams once or twice a year.  Routine eye exams. Ask your health care provider how often you should have your eyes checked.  Personal lifestyle choices, including:  Daily care of your teeth and gums.  Regular physical activity.  Eating a healthy diet.  Avoiding tobacco and drug use.  Limiting alcohol use.  Practicing safe sex.  Taking low-dose aspirin every day.  Taking vitamin and mineral supplements as recommended by your health care provider. What happens during an  annual well check? The services and screenings done by your health care provider during your annual well check will depend on your age, overall health, lifestyle risk factors, and family history of disease. Counseling  Your health care provider may ask you questions about your:  Alcohol use.  Tobacco use.  Drug use.  Emotional well-being.  Home and relationship well-being.  Sexual activity.  Eating habits.  History of falls.  Memory and ability to understand (cognition).  Work and work Statistician.  Reproductive health. Screening  You may have the following tests or measurements:  Height, weight, and BMI.  Blood pressure.  Lipid and cholesterol levels. These may be checked every 5 years, or more frequently if you are over 16 years old.  Skin check.  Lung cancer screening. You may have this screening every year starting at age 55 if you have a 30-pack-year history of smoking and currently smoke or have quit within the past 15 years.  Fecal occult blood test (FOBT) of the stool. You may have this test every year starting at age 52.  Flexible sigmoidoscopy or colonoscopy. You may have a sigmoidoscopy every 5 years or a colonoscopy every 10 years starting at age 110.  Hepatitis C blood test.  Hepatitis B blood test.  Sexually transmitted disease (STD) testing.  Diabetes screening. This is done by checking your blood sugar (glucose) after you have not eaten for a while (fasting). You may have this done every 1-3 years.  Bone density scan. This is done to screen for osteoporosis. You may have this done starting at age 73.  Mammogram. This may be done every 1-2 years. Talk to your health care provider about how often you should  have regular mammograms. Talk with your health care provider about your test results, treatment options, and if necessary, the need for more tests. Vaccines  Your health care provider may recommend certain vaccines, such as:  Influenza  vaccine. This is recommended every year.  Tetanus, diphtheria, and acellular pertussis (Tdap, Td) vaccine. You may need a Td booster every 10 years.  Zoster vaccine. You may need this after age 62.  Pneumococcal 13-valent conjugate (PCV13) vaccine. One dose is recommended after age 60.  Pneumococcal polysaccharide (PPSV23) vaccine. One dose is recommended after age 27. Talk to your health care provider about which screenings and vaccines you need and how often you need them. This information is not intended to replace advice given to you by your health care provider. Make sure you discuss any questions you have with your health care provider. Document Released: 05/30/2015 Document Revised: 01/21/2016 Document Reviewed: 03/04/2015 Elsevier Interactive Patient Education  2017 [place] Prevention in the Home Falls can cause injuries. They can happen to people of all ages. There are many things you can do to make your home safe and to help prevent falls. What can I do on the outside of my home?  Regularly fix the edges of walkways and driveways and fix any cracks.  Remove anything that might make you trip as you walk through a door, such as a raised step or threshold.  Trim any bushes or trees on the path to your home.  Use bright outdoor lighting.  Clear any walking paths of anything that might make someone trip, such as rocks or tools.  Regularly check to see if handrails are loose or broken. Make sure that both sides of any steps have handrails.  Any raised decks and porches should have guardrails on the edges.  Have any leaves, snow, or ice cleared regularly.  Use sand or salt on walking paths during winter.  Clean up any spills in your garage right away. This includes oil or grease spills. What can I do in the bathroom?  Use night lights.  Install grab bars by the toilet and in the tub and shower. Do not use towel bars as grab bars.  Use non-skid mats or decals  in the tub or shower.  If you need to sit down in the shower, use a plastic, non-slip stool.  Keep the floor dry. Clean up any water that spills on the floor as soon as it happens.  Remove soap buildup in the tub or shower regularly.  Attach bath mats securely with double-sided non-slip rug tape.  Do not have throw rugs and other things on the floor that can make you trip. What can I do in the bedroom?  Use night lights.  Make sure that you have a light by your bed that is easy to reach.  Do not use any sheets or blankets that are too big for your bed. They should not hang down onto the floor.  Have a firm chair that has side arms. You can use this for support while you get dressed.  Do not have throw rugs and other things on the floor that can make you trip. What can I do in the kitchen?  Clean up any spills right away.  Avoid walking on wet floors.  Keep items that you use a lot in easy-to-reach places.  If you need to reach something above you, use a strong step stool that has a grab bar.  Keep electrical cords out of  the way.  Do not use floor polish or wax that makes floors slippery. If you must use wax, use non-skid floor wax.  Do not have throw rugs and other things on the floor that can make you trip. What can I do with my stairs?  Do not leave any items on the stairs.  Make sure that there are handrails on both sides of the stairs and use them. Fix handrails that are broken or loose. Make sure that handrails are as long as the stairways.  Check any carpeting to make sure that it is firmly attached to the stairs. Fix any carpet that is loose or worn.  Avoid having throw rugs at the top or bottom of the stairs. If you do have throw rugs, attach them to the floor with carpet tape.  Make sure that you have a light switch at the top of the stairs and the bottom of the stairs. If you do not have them, ask someone to add them for you. What else can I do to help  prevent falls?  Wear shoes that:  Do not have high heels.  Have rubber bottoms.  Are comfortable and fit you well.  Are closed at the toe. Do not wear sandals.  If you use a stepladder:  Make sure that it is fully opened. Do not climb a closed stepladder.  Make sure that both sides of the stepladder are locked into place.  Ask someone to hold it for you, if possible.  Clearly mark and make sure that you can see:  Any grab bars or handrails.  First and last steps.  Where the edge of each step is.  Use tools that help you move around (mobility aids) if they are needed. These include:  Canes.  Walkers.  Scooters.  Crutches.  Turn on the lights when you go into a dark area. Replace any light bulbs as soon as they burn out.  Set up your furniture so you have a clear path. Avoid moving your furniture around.  If any of your floors are uneven, fix them.  If there are any pets around you, be aware of where they are.  Review your medicines with your doctor. Some medicines can make you feel dizzy. This can increase your chance of falling. Ask your doctor what other things that you can do to help prevent falls. This information is not intended to replace advice given to you by your health care provider. Make sure you discuss any questions you have with your health care provider. Document Released: 02/27/2009 Document Revised: 10/09/2015 Document Reviewed: 06/07/2014 Elsevier Interactive Patient Education  2017 [employer].

## 2018-11-27 ENCOUNTER — Ambulatory Visit
Admission: RE | Admit: 2018-11-27 | Discharge: 2018-11-27 | Disposition: A | Discharge status: Home / Self Care | Payer: Medicare Other | Source: Ambulatory Visit | Attending: Family Medicine | Admitting: Family Medicine

## 2018-11-27 ENCOUNTER — Other Ambulatory Visit: Payer: Self-pay

## 2018-11-27 DIAGNOSIS — Z1231 Encounter for screening mammogram for malignant neoplasm of breast: Secondary | ICD-10-CM | POA: Diagnosis not present

## 2018-12-13 ENCOUNTER — Other Ambulatory Visit: Payer: Self-pay | Admitting: Family Medicine

## 2018-12-13 NOTE — Telephone Encounter (Signed)
Please set up routine appointment around December 2020

## 2018-12-13 NOTE — Telephone Encounter (Signed)
LVM to sch appt

## 2019-06-14 ENCOUNTER — Telehealth: Payer: Self-pay | Admitting: *Deleted

## 2019-06-14 NOTE — Telephone Encounter (Signed)
Left message for patient to notify them that it is time to schedule annual low dose lung cancer screening CT scan. Instructed patient to call back to verify information prior to the scan being scheduled.  

## 2019-07-05 ENCOUNTER — Telehealth: Payer: Self-pay | Admitting: *Deleted

## 2019-07-05 DIAGNOSIS — Z87891 Personal history of nicotine dependence: Secondary | ICD-10-CM

## 2019-07-05 NOTE — Telephone Encounter (Signed)
Patient has been notified that annual lung cancer screening low dose CT scan is due currently or will be in near future. Confirmed that patient is within the age range of 55-77, and asymptomatic, (no signs or symptoms of lung cancer). Patient denies illness that would prevent curative treatment for lung cancer if found. Verified smoking history, (current, 34.5 pack year). The shared decision making visit was done 06/09/16. Patient is agreeable for CT scan being scheduled.

## 2019-07-11 ENCOUNTER — Other Ambulatory Visit: Payer: Self-pay

## 2019-07-11 ENCOUNTER — Ambulatory Visit
Admission: RE | Admit: 2019-07-11 | Discharge: 2019-07-11 | Disposition: A | Discharge status: Home / Self Care | Payer: Medicare Other | Source: Ambulatory Visit | Attending: Nurse Practitioner | Admitting: Nurse Practitioner

## 2019-07-11 DIAGNOSIS — Z87891 Personal history of nicotine dependence: Secondary | ICD-10-CM | POA: Diagnosis not present

## 2019-07-13 ENCOUNTER — Encounter: Payer: Self-pay | Admitting: *Deleted

## 2019-11-20 ENCOUNTER — Ambulatory Visit: Payer: Medicare Other

## 2019-11-22 ENCOUNTER — Ambulatory Visit: Payer: Medicare Other

## 2020-01-15 NOTE — Progress Notes (Signed)
Patient ID: Julie Beck, female    DOB: Aug 30, 1945, 74 y.o.   MRN: [id number]  PCP: System, Pcp Not In  Chief Complaint  Patient presents with  . Follow-up    Subjective:   Julie Beck is a 74 y.o. female, presents to clinic with CC of the following:  Chief Complaint  Patient presents with  . Follow-up    HPI:  Patient is a 74 year old female Last visit at [facility] was in December 2019 She did have an annual Medicare wellness exam 11/16/2018 Follows up today, requesting a mammogram referral  Hyperlipidemia-on atorvastatin 80 mg daily and zetia 10 mg daily Taking medicines regularly Goal is LDL less than 70 Lab Results  Component Value Date   CHOL 133 04/19/2018   HDL 46 (L) 04/19/2018   LDLCALC 72 04/19/2018   TRIG 72 04/19/2018   CHOLHDL 2.9 04/19/2018   No myalgias  Coronary artery disease/Thoracic aortic atherosclerosis (HCC) (Chronic) Followed by cardiology in the past, although not in the recent past, as cardiology recommend follow-up as needed after last seen in November 2017. Denies recent chest pain, palpitations, shortness of breath, increased lower extremity swelling,   GERD;  Had been on omeprazole in the past, although stopped a while ago, and has not been taking in the recent past.  Denies any recent reflux symptoms of concern Denies any dark or black stools, bleeding per rectum, chronic abdominal pain  Tobacco use -current some day smoker, one pack lasts 2.5 days. When isolated with pandemic, seemed to go back to using.  Strongly encouraged trying to reduce numbers of cigarettes again   Cancer screenings Colorectal Screening: Completed 11/28/17 with multiple polyps noted and a follow-up recommended in 1 year for surveillance after piecemeal polypectomy. pt aware to contact GI to follow up for repeat screening colonoscopy noted on annual Medicare evaluation 11/2018, although has not had a follow-up colonoscopy.  Mammogram: Completed  11/2018  -negative with a follow-up recommended in 1 year  Bone Density: Completed 04/22/14. Results reflect OSTEOPENIA. Repeat every 2 years. Pt declined repeat screening on the annual Medicare evaluation 11/2018, declined again today.    Lung Cancer Screening:  Last screening CT completed 07/11/2019 with the following impression: IMPRESSION: 1. Lung-RADS 2, benign appearance or behavior. Continue annual screening with low-dose chest CT without contrast in 12 months. 2. Aortic atherosclerosis (ICD10-I70.0). Coronary artery calcification. 3.  Emphysema (ICD10-J43.9).  Last women's health evaluation - many years ago, no recent sx's. Discussed evaluation in the near future.  She notes she still has some menopausal symptoms with some periodic hot flashes.  Had Covid vaccine, awaiting opportunity to get a booster   Patient Active Problem List   Diagnosis Date Noted  . Cirrhosis of liver ([place]) 07/20/2016  . Centrilobular emphysema ([place]) 07/12/2016  . Thoracic aortic atherosclerosis ([place]) 07/12/2016  . Coronary artery disease 07/12/2016  . Personal history of tobacco use, presenting hazards to health 06/11/2016  . Positive colorectal cancer screening using DNA-based stool test 06/07/2016  . Colon cancer screening 05/12/2016  . Postmenopausal 05/12/2016  . Screening for osteoporosis 05/12/2016  . Preventative health care 05/12/2016  . Hyperlipidemia, familial, high LDL 03/18/2016  . Medication monitoring encounter 03/18/2016  . Abnormal EKG 03/17/2016  . Tobacco abuse 03/17/2016  . GERD (gastroesophageal reflux disease) 03/17/2016  . Breast cancer screening 03/17/2016  . Overweight (BMI 25.0-29.9) 03/17/2016      Current Outpatient Medications:  .  atorvastatin (LIPITOR) 80 MG tablet, TAKE 1 TABLET  BY MOUTH AT BEDTIME, Disp: 90 tablet, Rfl: 3 .  calcium-vitamin D (OSCAL WITH D) 500-200 MG-UNIT tablet, Take 1 tablet by mouth every morning., Disp: , Rfl:  .  ezetimibe (ZETIA) 10 MG tablet,  TAKE 1 TABLET(10 MG) BY MOUTH DAILY, Disp: 90 tablet, Rfl: 3 .  Multiple Vitamin (MULTIVITAMIN) tablet, Take 1 tablet by mouth daily., Disp: , Rfl:    Allergies  Allergen Reactions  . Sulfa Antibiotics Hives     Past Surgical History:  Procedure Laterality Date  . COLONOSCOPY WITH PROPOFOL N/A 11/28/2017   Procedure: COLONOSCOPY WITH PROPOFOL;  Surgeon: Lin Landsman, MD;  Location: [facility] ENDOSCOPY;  Service: Gastroenterology;  Laterality: N/A;  . INGUINAL HERNIA REPAIR Bilateral   . KNEE ARTHROSCOPY Bilateral   . TUBAL LIGATION       Family History  Problem Relation Age of Onset  . Alzheimer's disease Mother   . Breast cancer Mother   . Cancer Father        lung  . Dementia Sister   . Cancer Maternal Grandmother        breast  . Breast cancer Maternal Grandmother   . Heart attack Maternal Grandfather   . Healthy Sister   . Breast cancer Maternal Aunt   . Bladder Cancer Neg Hx   . Kidney cancer Neg Hx      Social History   Tobacco Use  . Smoking status: Current Some Day Smoker    Packs/day: 0.25    Years: 52.00    Pack years: 13.00    Types: Cigarettes    Last attempt to quit: 03/20/2018    Years since quitting: 1.8  . Smokeless tobacco: Never Used  Substance Use Topics  . Alcohol use: Yes    Comment: socially    With staff assistance, above reviewed with the patient  today.  ROS: As per HPI, otherwise no specific complaints on a limited and focused system review   No results found for this or any previous visit (from the past 72 hour(s)).   PHQ2/9: Depression screen [facility] 2/9 01/16/2020 11/16/2018 04/19/2018 10/27/2017 10/06/2017  Decreased Interest 0 0 0 0 0  Down, Depressed, Hopeless 0 0 0 0 0  PHQ - 2 Score 0 0 0 0 0  Altered sleeping - - 0 - 0  Tired, decreased energy - - 0 - 0  Change in appetite - - 0 - 0  Feeling bad or failure about yourself  - - 0 - 0  Trouble concentrating - - 0 - 0  Moving slowly or fidgety/restless - - 0 - 0  Suicidal  thoughts - - 0 - 0  PHQ-9 Score - - 0 - 0  Difficult doing work/chores - - Not difficult at all - Not difficult at all   PHQ-2/9 Result is neg  Fall Risk: Fall Risk  01/16/2020 11/16/2018 04/19/2018 10/27/2017 10/06/2017  Falls in the past year? 0 0 0 No No  Number falls in past yr: 0 0 - - -  Injury with Fall? 0 0 - - -  Risk for fall due to : - - - - Impaired vision  Risk for fall due to: Comment - - - - wears eyeglasses  Follow up Falls evaluation completed Falls prevention discussed - - -      Objective:   Vitals:   01/16/20 0806  BP: 132/68  Pulse: 83  Resp: 16  Temp: 98.1 F (36.7 C)  TempSrc: Oral  SpO2: 96%  Weight: 141  lb (64 kg)  Height: 5\' 8"  (1.727 m)    Body mass index is 21.44 kg/m.  Physical Exam   NAD, masked, very pleasant HEENT - [place]/AT, sclera anicteric, PERRL, + glasses, EOMI, conj - non-inj'ed, TM's and canals clear, pharynx clear Neck - supple, no adenopathy, no TM, carotids 2+ and = without bruits bilat Car - RRR without m/g/r Pulm- RR and effort normal at rest, CTA without wheeze or rales Abd - soft, NT, ND, BS+,  no masses, no obvious HSM Back - no CVA tenderness Skin- no rash noted on exposed areas Ext - no LE edema, no active joints, had a brace on her right knee which she uses intermittently (noted a history of meniscus surgery and arthritis concerns after that is intermittently bothersome, manages well presently) Neuro/psychiatric - affect was not flat, appropriate with conversation  Alert and oriented  Grossly non-focal - good strength on testing extremities, sensation intact to LT in distal extremities, Romberg was negative, no pronator drift, good finger-to-nose,  Speech and gait are normal   Results for orders placed or performed in visit on 04/19/18  Lipid panel  Result Value Ref Range   Cholesterol 133 <200 mg/dL   HDL 46 (L) >50 mg/dL   Triglycerides 72 <150 mg/dL   LDL Cholesterol (Calc) 72 mg/dL (calc)   Total CHOL/HDL Ratio 2.9  <5.0 (calc)   Non-HDL Cholesterol (Calc) 87 <130 mg/dL (calc)       Assessment & Plan:   1. Polyp of colon, unspecified part of colon, unspecified type Discussed the results from the prior colonoscopy, with the recommendations to have a surveillance colonoscopy 1 year after.  She has not followed up to have that done as of yet.  Did recommend that she proceed with that, and a referral provided today. She was okay with the referral provided today. - Ambulatory referral to Gastroenterology - CBC with Differential/Platelet  2. Encounter for screening mammogram for breast cancer Is due for a screening mammogram in order. - MM 3D SCREEN BREAST BILATERAL; Future  3. Screening for thyroid disorder We will check a TSH with her labs today - TSH  4. Hyperlipidemia, familial, high LDL Last lipid panel reviewed.  Has been over 2 years since. On a statin and Zetia and doing well with these presently. We will recheck a lipid panel today. Also check a liver test within the comp panel - Lipid panel - COMPLETE METABOLIC PANEL WITH GFR  5. Coronary artery disease involving native coronary artery of native heart without angina pectoris No recent symptoms of concern, no longer seeing cardiology. On high-dose statin and continue. - COMPLETE METABOLIC PANEL WITH GFR  6. Thoracic aortic atherosclerosis ([place]) As above, on a statin  7. Gastroesophageal reflux disease without esophagitis Has been doing well with no recent symptoms of concern. - CBC with Differential/Platelet  8. Osteopenia, unspecified location Discussed obtaining a bone density, and she declined that presently. Prior had osteopenia, not frank osteoporosis. Importance of vitamin D and calcium supplements noted, and she states she does take a calcium supplement with vitamin D presently and to continue.   Discussed having a woman's health evaluation, and she is not having any concerning symptoms presently outside of some  intermittent hot flashes that still occur with her menopause. She will consider, and noted if she wants that done through this practice, my female colleagues help with those assessments. We will have a follow-up again in a years time at the latest, sooner as needed. Await results  of labs ordered today as well.     Towanda Malkin, MD 01/16/20 8:14 AM

## 2020-01-16 ENCOUNTER — Encounter: Payer: Self-pay | Admitting: Internal Medicine

## 2020-01-16 ENCOUNTER — Other Ambulatory Visit: Payer: Self-pay | Admitting: Internal Medicine

## 2020-01-16 ENCOUNTER — Ambulatory Visit (INDEPENDENT_AMBULATORY_CARE_PROVIDER_SITE_OTHER): Payer: Medicare Other | Admitting: Internal Medicine

## 2020-01-16 ENCOUNTER — Other Ambulatory Visit: Payer: Self-pay

## 2020-01-16 VITALS — BP 132/68 | HR 83 | Temp 98.1°F | Resp 16 | Ht 68.0 in | Wt 141.0 lb

## 2020-01-16 DIAGNOSIS — I700 Atherosclerosis of aorta: Secondary | ICD-10-CM

## 2020-01-16 DIAGNOSIS — Z1329 Encounter for screening for other suspected endocrine disorder: Secondary | ICD-10-CM | POA: Diagnosis not present

## 2020-01-16 DIAGNOSIS — I2510 Atherosclerotic heart disease of native coronary artery without angina pectoris: Secondary | ICD-10-CM

## 2020-01-16 DIAGNOSIS — K635 Polyp of colon: Secondary | ICD-10-CM

## 2020-01-16 DIAGNOSIS — E7849 Other hyperlipidemia: Secondary | ICD-10-CM | POA: Diagnosis not present

## 2020-01-16 DIAGNOSIS — K219 Gastro-esophageal reflux disease without esophagitis: Secondary | ICD-10-CM

## 2020-01-16 DIAGNOSIS — Z1231 Encounter for screening mammogram for malignant neoplasm of breast: Secondary | ICD-10-CM | POA: Diagnosis not present

## 2020-01-16 DIAGNOSIS — M8580 Other specified disorders of bone density and structure, unspecified site: Secondary | ICD-10-CM

## 2020-01-16 MED ORDER — ATORVASTATIN CALCIUM 80 MG PO TABS: 80.0000 mg | ORAL_TABLET | Freq: Every day | ORAL | 0 refills | Status: DC

## 2020-01-16 MED ORDER — EZETIMIBE 10 MG PO TABS: ORAL_TABLET | ORAL | 0 refills | Status: DC

## 2020-01-16 NOTE — Patient Instructions (Signed)

## 2020-01-16 NOTE — Telephone Encounter (Signed)
Medication Refill - Medication: atorvastatin (LIPITOR) 80 MG tablet   ezetimibe (ZETIA) 10 MG tablet   Has the patient contacted their pharmacy? Yes.   (Agent: If no, request that the patient contact the pharmacy for the refill.) (Agent: If yes, when and what did the pharmacy advise?)  Preferred Pharmacy (with phone number or street name): [id number] [address], [address]  [place], [place] [address]  Phone:  [phone] Fax:  [phone]   Agent: Please be advised that RX refills may take up to 3 business days. We ask that you follow-up with your pharmacy.

## 2020-01-16 NOTE — Telephone Encounter (Signed)
Requested Prescriptions  Pending Prescriptions Disp Refills   atorvastatin (LIPITOR) 80 MG tablet 30 tablet 0    Sig: Take 1 tablet (80 mg total) by mouth at bedtime.     Cardiovascular:  Antilipid - Statins Failed - 01/16/2020  2:49 PM      Failed - Total Cholesterol in normal range and within 360 days    Cholesterol  Date Value Ref Range Status  04/19/2018 133 <200 mg/dL Final         Failed - LDL in normal range and within 360 days    LDL Cholesterol (Calc)  Date Value Ref Range Status  04/19/2018 72 mg/dL (calc) Final    Comment:    Reference range: <100 . Desirable range <100 mg/dL for primary prevention;   <70 mg/dL for patients with CHD or diabetic patients  with > or = 2 CHD risk factors. Marland Kitchen LDL-C is now calculated using the Martin-Hopkins  calculation, which is a validated novel method providing  better accuracy than the Friedewald equation in the  estimation of LDL-C.  Cresenciano Genre et al. Annamaria Helling. 7829;562(13): 2061-2068  (http://education.[facility].com/faq/FAQ164)          Failed - HDL in normal range and within 360 days    HDL  Date Value Ref Range Status  04/19/2018 46 (L) >50 mg/dL Final         Failed - Triglycerides in normal range and within 360 days    Triglycerides  Date Value Ref Range Status  04/19/2018 72 <150 mg/dL Final         Passed - Patient is not pregnant      Passed - Valid encounter within last 12 months    Recent Outpatient Visits          Today Polyp of colon, unspecified part of colon, unspecified type   [place], [place], MD   1 year ago Coronary artery disease involving native coronary artery of native heart without angina pectoris   [place], Satira Anis, MD   2 years ago Hyperlipidemia, familial, high LDL   [place], Satira Anis, MD   3 years ago Hematuria, unspecified type   [place], [place], FNP   3  years ago [place], [place], FNP      Future Appointments            In 1 year Towanda Malkin, MD [facility]            ezetimibe (ZETIA) 10 MG tablet 90 tablet 3     Cardiovascular:  Antilipid - Sterol Transport Inhibitors Failed - 01/16/2020  2:49 PM      Failed - Total Cholesterol in normal range and within 360 days    Cholesterol  Date Value Ref Range Status  04/19/2018 133 <200 mg/dL Final         Failed - LDL in normal range and within 360 days    LDL Cholesterol (Calc)  Date Value Ref Range Status  04/19/2018 72 mg/dL (calc) Final    Comment:    Reference range: <100 . Desirable range <100 mg/dL for primary prevention;   <70 mg/dL for patients with CHD or diabetic patients  with > or = 2 CHD risk factors. Marland Kitchen LDL-C is now calculated using the Martin-Hopkins  calculation, which is a validated novel method providing  better accuracy than the  Friedewald equation in the  estimation of LDL-C.  Cresenciano Genre et al. Annamaria Helling. 2956;213(08): 2061-2068  (http://education.[facility].com/faq/FAQ164)          Failed - HDL in normal range and within 360 days    HDL  Date Value Ref Range Status  04/19/2018 46 (L) >50 mg/dL Final         Failed - Triglycerides in normal range and within 360 days    Triglycerides  Date Value Ref Range Status  04/19/2018 72 <150 mg/dL Final         Passed - Valid encounter within last 12 months    Recent Outpatient Visits          Today Polyp of colon, unspecified part of colon, unspecified type   [facility] Lebron Conners D, MD   1 year ago Coronary artery disease involving native coronary artery of native heart without angina pectoris   [place], Satira Anis, MD   2 years ago Hyperlipidemia, familial, high LDL   [place], Satira Anis, MD   3 years ago Hematuria, unspecified type    [place], [place], FNP   3 years ago [place], [place], FNP      Future Appointments            In 1 year Towanda Malkin, MD [place] [facility] for each medic; awaiting results of lipid panel ordered today.

## 2020-01-16 NOTE — Telephone Encounter (Signed)
Not a [place] patient.  Please address.  Thank you.

## 2020-01-17 LAB — CBC WITH DIFFERENTIAL/PLATELET
Absolute Monocytes: 454 cells/uL (ref 200–950)
Basophils Absolute: 22 cells/uL (ref 0–200)
Basophils Relative: 0.4 %
Eosinophils Absolute: 200 cells/uL (ref 15–500)
Eosinophils Relative: 3.7 %
HCT: 37.1 % (ref 35.0–45.0)
Hemoglobin: 12.6 g/dL (ref 11.7–15.5)
Lymphs Abs: 2074 cells/uL (ref 850–3900)
MCH: 32.9 pg (ref 27.0–33.0)
MCHC: 34 g/dL (ref 32.0–36.0)
MCV: 96.9 fL (ref 80.0–100.0)
MPV: 9 fL (ref 7.5–12.5)
Monocytes Relative: 8.4 %
Neutro Abs: 2651 cells/uL (ref 1500–7800)
Neutrophils Relative %: 49.1 %
Platelets: 272 10*3/uL (ref 140–400)
RBC: 3.83 10*6/uL (ref 3.80–5.10)
RDW: 13.6 % (ref 11.0–15.0)
Total Lymphocyte: 38.4 %
WBC: 5.4 10*3/uL (ref 3.8–10.8)

## 2020-01-17 LAB — LIPID PANEL
Cholesterol: 127 mg/dL (ref <200)
HDL: 40 mg/dL — ABNORMAL LOW (ref >=50)
LDL Cholesterol (Calc): 73 mg/dL (calc)
Non-HDL Cholesterol (Calc): 87 mg/dL (calc) (ref <130)
Total CHOL/HDL Ratio: 3.2 (calc) (ref <5.0)
Triglycerides: 65 mg/dL (ref <150)

## 2020-01-17 LAB — COMPLETE METABOLIC PANEL WITH GFR
AG Ratio: 1.3 (calc) (ref 1.0–2.5)
ALT: 14 U/L (ref 6–29)
AST: 27 U/L (ref 10–35)
Albumin: 3.8 g/dL (ref 3.6–5.1)
Alkaline phosphatase (APISO): 109 U/L (ref 37–153)
BUN: 9 mg/dL (ref 7–25)
CO2: 26 mmol/L (ref 20–32)
Calcium: 8.9 mg/dL (ref 8.6–10.4)
Chloride: 101 mmol/L (ref 98–110)
Creat: 0.61 mg/dL (ref 0.60–0.93)
GFR, Est African American: 105 mL/min/{1.73_m2} (ref >=60)
GFR, Est Non African American: 91 mL/min/{1.73_m2} (ref >=60)
Globulin: 3 g/dL (calc) (ref 1.9–3.7)
Glucose, Bld: 74 mg/dL (ref 65–99)
Potassium: 4.8 mmol/L (ref 3.5–5.3)
Sodium: 134 mmol/L — ABNORMAL LOW (ref 135–146)
Total Bilirubin: 0.6 mg/dL (ref 0.2–1.2)
Total Protein: 6.8 g/dL (ref 6.1–8.1)

## 2020-01-17 LAB — TSH: TSH: 0.85 mIU/L (ref 0.40–4.50)

## 2020-01-23 ENCOUNTER — Encounter: Payer: Self-pay | Admitting: *Deleted

## 2020-02-18 ENCOUNTER — Telehealth: Payer: Self-pay | Admitting: Internal Medicine

## 2020-02-18 NOTE — Telephone Encounter (Signed)
Copied from [place] [id number]. Topic: Medicare AWV >> Feb 18, 2020 10:21 AM Cher Nakai R wrote: Reason for CRM:  Left message for patient to call back and schedule the Medicare Annual Wellness Visit (AWV) in office or virtual  Last AWV 11/16/2018  Please schedule at anytime with [place].  40 minute appointment  Any questions, please contact me at [phone]

## 2020-03-10 ENCOUNTER — Other Ambulatory Visit: Payer: Self-pay | Admitting: Internal Medicine

## 2020-03-19 ENCOUNTER — Other Ambulatory Visit: Payer: Self-pay | Admitting: Internal Medicine

## 2020-04-02 ENCOUNTER — Telehealth: Payer: Self-pay

## 2020-04-02 NOTE — Telephone Encounter (Signed)
Thanks

## 2020-04-02 NOTE — Telephone Encounter (Signed)
Copied from [place] [id number]. Topic: General - Inquiry >> Apr 02, 2020  2:04 PM Gillis Ends D wrote: Reason for CRM: Patient contacted the pharmacy and the pharmacy stated that they never received the e scribe prescriptions. She never received the prescriptions from 03-10-2020. Can you resend the prescriptions again to the pharmacy for the atorvastatin and the zetia. Can you contact the patient and let her know that they were sent again. Please advise

## 2020-04-21 ENCOUNTER — Other Ambulatory Visit: Payer: Self-pay

## 2020-04-21 ENCOUNTER — Ambulatory Visit
Admission: RE | Admit: 2020-04-21 | Discharge: 2020-04-21 | Disposition: A | Discharge status: Home / Self Care | Payer: Medicare Other | Source: Ambulatory Visit | Attending: Internal Medicine | Admitting: Internal Medicine

## 2020-04-21 DIAGNOSIS — Z1231 Encounter for screening mammogram for malignant neoplasm of breast: Secondary | ICD-10-CM | POA: Diagnosis present

## 2020-04-22 ENCOUNTER — Other Ambulatory Visit: Payer: Self-pay | Admitting: Internal Medicine

## 2020-04-22 DIAGNOSIS — R921 Mammographic calcification found on diagnostic imaging of breast: Secondary | ICD-10-CM

## 2020-04-22 DIAGNOSIS — N6320 Unspecified lump in the left breast, unspecified quadrant: Secondary | ICD-10-CM

## 2020-04-22 DIAGNOSIS — R928 Other abnormal and inconclusive findings on diagnostic imaging of breast: Secondary | ICD-10-CM

## 2020-04-24 ENCOUNTER — Other Ambulatory Visit: Payer: Self-pay

## 2020-04-24 ENCOUNTER — Ambulatory Visit (INDEPENDENT_AMBULATORY_CARE_PROVIDER_SITE_OTHER): Payer: Medicare Other

## 2020-04-24 VITALS — BP 162/76 | HR 96 | Temp 99.4°F | Resp 16 | Ht 68.0 in | Wt 141.0 lb

## 2020-04-24 DIAGNOSIS — Z0000 Encounter for general adult medical examination without abnormal findings: Secondary | ICD-10-CM

## 2020-04-24 NOTE — Patient Instructions (Addendum)
Julie Beck , Thank you for taking time to come for your Medicare Wellness Visit. I appreciate your ongoing commitment to your health goals. Please review the following plan we discussed and let me know if I can assist you in the future.   Screening recommendations/referrals: Colonoscopy: done 11/28/17. Due for repeat screening.  Mammogram: done 04/21/20 Bone Density: done 04/22/14 Recommended yearly ophthalmology/optometry visit for glaucoma screening and checkup Recommended yearly dental visit for hygiene and checkup  Vaccinations: Influenza vaccine: due Pneumococcal vaccine: done 05/29/14. Due for Pneumovax23 Tdap vaccine: done 06/23/14 Shingles vaccine: Shingrix discussed. Please contact your pharmacy for coverage information.     Covid-19: done 07/12/19 & 08/02/19  Advanced directives: Please bring a copy of your health care power of attorney and living will to the office at your convenience once you have completed that paperwork.   Conditions/risks identified: If you wish to quit smoking, help is available. For free tobacco cessation program offerings call the [facility] at [phone]) [phone] or Live Well Line at [phone]. You may also visit [email or web address] or email [email or web address] for more information on other programs.    Next appointment: Follow up in one year for your annual wellness visit    Preventive Care 65 Years and Older, Female Preventive care refers to lifestyle choices and visits with your health care provider that can promote health and wellness. What does preventive care include?  A yearly physical exam. This is also called an annual well check.  Dental exams once or twice a year.  Routine eye exams. Ask your health care provider how often you should have your eyes checked.  Personal lifestyle choices, including:  Daily care of your teeth and gums.  Regular physical activity.  Eating a healthy diet.  Avoiding tobacco and drug  use.  Limiting alcohol use.  Practicing safe sex.  Taking low-dose aspirin every day.  Taking vitamin and mineral supplements as recommended by your health care provider. What happens during an annual well check? The services and screenings done by your health care provider during your annual well check will depend on your age, overall health, lifestyle risk factors, and family history of disease. Counseling  Your health care provider may ask you questions about your:  Alcohol use.  Tobacco use.  Drug use.  Emotional well-being.  Home and relationship well-being.  Sexual activity.  Eating habits.  History of falls.  Memory and ability to understand (cognition).  Work and work Statistician.  Reproductive health. Screening  You may have the following tests or measurements:  Height, weight, and BMI.  Blood pressure.  Lipid and cholesterol levels. These may be checked every 5 years, or more frequently if you are over 32 years old.  Skin check.  Lung cancer screening. You may have this screening every year starting at age 33 if you have a 30-pack-year history of smoking and currently smoke or have quit within the past 15 years.  Fecal occult blood test (FOBT) of the stool. You may have this test every year starting at age 73.  Flexible sigmoidoscopy or colonoscopy. You may have a sigmoidoscopy every 5 years or a colonoscopy every 10 years starting at age 64.  Hepatitis C blood test.  Hepatitis B blood test.  Sexually transmitted disease (STD) testing.  Diabetes screening. This is done by checking your blood sugar (glucose) after you have not eaten for a while (fasting). You may have this done every 1-3 years.  Bone density scan. This is done to  screen for osteoporosis. You may have this done starting at age 63.  Mammogram. This may be done every 1-2 years. Talk to your health care provider about how often you should have regular mammograms. Talk with your  health care provider about your test results, treatment options, and if necessary, the need for more tests. Vaccines  Your health care provider may recommend certain vaccines, such as:  Influenza vaccine. This is recommended every year.  Tetanus, diphtheria, and acellular pertussis (Tdap, Td) vaccine. You may need a Td booster every 10 years.  Zoster vaccine. You may need this after age 52.  Pneumococcal 13-valent conjugate (PCV13) vaccine. One dose is recommended after age 49.  Pneumococcal polysaccharide (PPSV23) vaccine. One dose is recommended after age 58. Talk to your health care provider about which screenings and vaccines you need and how often you need them. This information is not intended to replace advice given to you by your health care provider. Make sure you discuss any questions you have with your health care provider. Document Released: 05/30/2015 Document Revised: 01/21/2016 Document Reviewed: 03/04/2015 Elsevier Interactive Patient Education  2017 [place] Prevention in the Home Falls can cause injuries. They can happen to people of all ages. There are many things you can do to make your home safe and to help prevent falls. What can I do on the outside of my home?  Regularly fix the edges of walkways and driveways and fix any cracks.  Remove anything that might make you trip as you walk through a door, such as a raised step or threshold.  Trim any bushes or trees on the path to your home.  Use bright outdoor lighting.  Clear any walking paths of anything that might make someone trip, such as rocks or tools.  Regularly check to see if handrails are loose or broken. Make sure that both sides of any steps have handrails.  Any raised decks and porches should have guardrails on the edges.  Have any leaves, snow, or ice cleared regularly.  Use sand or salt on walking paths during winter.  Clean up any spills in your garage right away. This includes oil  or grease spills. What can I do in the bathroom?  Use night lights.  Install grab bars by the toilet and in the tub and shower. Do not use towel bars as grab bars.  Use non-skid mats or decals in the tub or shower.  If you need to sit down in the shower, use a plastic, non-slip stool.  Keep the floor dry. Clean up any water that spills on the floor as soon as it happens.  Remove soap buildup in the tub or shower regularly.  Attach bath mats securely with double-sided non-slip rug tape.  Do not have throw rugs and other things on the floor that can make you trip. What can I do in the bedroom?  Use night lights.  Make sure that you have a light by your bed that is easy to reach.  Do not use any sheets or blankets that are too big for your bed. They should not hang down onto the floor.  Have a firm chair that has side arms. You can use this for support while you get dressed.  Do not have throw rugs and other things on the floor that can make you trip. What can I do in the kitchen?  Clean up any spills right away.  Avoid walking on wet floors.  Keep items that  you use a lot in easy-to-reach places.  If you need to reach something above you, use a strong step stool that has a grab bar.  Keep electrical cords out of the way.  Do not use floor polish or wax that makes floors slippery. If you must use wax, use non-skid floor wax.  Do not have throw rugs and other things on the floor that can make you trip. What can I do with my stairs?  Do not leave any items on the stairs.  Make sure that there are handrails on both sides of the stairs and use them. Fix handrails that are broken or loose. Make sure that handrails are as long as the stairways.  Check any carpeting to make sure that it is firmly attached to the stairs. Fix any carpet that is loose or worn.  Avoid having throw rugs at the top or bottom of the stairs. If you do have throw rugs, attach them to the floor with  carpet tape.  Make sure that you have a light switch at the top of the stairs and the bottom of the stairs. If you do not have them, ask someone to add them for you. What else can I do to help prevent falls?  Wear shoes that:  Do not have high heels.  Have rubber bottoms.  Are comfortable and fit you well.  Are closed at the toe. Do not wear sandals.  If you use a stepladder:  Make sure that it is fully opened. Do not climb a closed stepladder.  Make sure that both sides of the stepladder are locked into place.  Ask someone to hold it for you, if possible.  Clearly mark and make sure that you can see:  Any grab bars or handrails.  First and last steps.  Where the edge of each step is.  Use tools that help you move around (mobility aids) if they are needed. These include:  Canes.  Walkers.  Scooters.  Crutches.  Turn on the lights when you go into a dark area. Replace any light bulbs as soon as they burn out.  Set up your furniture so you have a clear path. Avoid moving your furniture around.  If any of your floors are uneven, fix them.  If there are any pets around you, be aware of where they are.  Review your medicines with your doctor. Some medicines can make you feel dizzy. This can increase your chance of falling. Ask your doctor what other things that you can do to help prevent falls. This information is not intended to replace advice given to you by your health care provider. Make sure you discuss any questions you have with your health care provider. Document Released: 02/27/2009 Document Revised: 10/09/2015 Document Reviewed: 06/07/2014 Elsevier Interactive Patient Education  2017 [employer].

## 2020-04-24 NOTE — Progress Notes (Addendum)
Subjective:   Julie Beck is a 74 y.o. female who presents for Medicare Annual (Subsequent) preventive examination.  Review of Systems     Cardiac Risk Factors include: advanced age (>30men, >57 women);dyslipidemia     Objective:    Today's Vitals   04/24/20 1506  BP: (!) 162/76  Pulse: 96  Resp: 16  Temp: 99.4 F (37.4 C)  TempSrc: Oral  SpO2: 99%  Weight: 141 lb (64 kg)  Height: 5\' 8"  (1.727 m)   Body mass index is 21.44 kg/m.  Advanced Directives 04/24/2020 11/16/2018 11/28/2017 10/06/2017 11/18/2016 11/15/2016 11/11/2016  Does Patient Have a Medical Advance Directive? No Yes Yes Yes No No No  Type of Advance Directive - Living will Living will Living will - - -  Does patient want to make changes to medical advance directive? - Yes (MAU/Ambulatory/Procedural Areas - Information given) - Yes (MAU/Ambulatory/Procedural Areas - Information given) - - -  Copy of [place] in Chart? - - - - - - -  Would patient like information on creating a medical advance directive? No - Patient declined - - - - - -    Current Medications (verified) Outpatient Encounter Medications as of 04/24/2020  Medication Sig  . atorvastatin (LIPITOR) 80 MG tablet TAKE 1 TABLET(80 MG) BY MOUTH AT BEDTIME  . calcium-vitamin D (OSCAL WITH D) 500-200 MG-UNIT tablet Take 1 tablet by mouth every morning.  . ezetimibe (ZETIA) 10 MG tablet TAKE 1 TABLET(10 MG) BY MOUTH DAILY  . Multiple Vitamin (MULTIVITAMIN) tablet Take 1 tablet by mouth daily.   No facility-administered encounter medications on file as of 04/24/2020.    Allergies (verified) Sulfa antibiotics   History: Past Medical History:  Diagnosis Date  . Abnormal CT of liver 07/12/2016   Chest CT Feb 2018  . Arthritis   . Centrilobular emphysema ([place]) 07/12/2016   Chest CT Feb 2018  . Coronary artery disease 07/12/2016   Followed by Dr. Saunders Revel, seen on chest CT Feb 2018  . Epigastric abdominal pain 03/17/2016  . Hyperlipidemia    . Overweight (BMI 25.0-29.9) 03/17/2016  . Thoracic aortic atherosclerosis ([place]) 07/12/2016   Chest CT scan Feb 2018  . Tobacco abuse 03/17/2016   Past Surgical History:  Procedure Laterality Date  . COLONOSCOPY WITH PROPOFOL N/A 11/28/2017   Procedure: COLONOSCOPY WITH PROPOFOL;  Surgeon: Lin Landsman, MD;  Location: [facility] ENDOSCOPY;  Service: Gastroenterology;  Laterality: N/A;  . INGUINAL HERNIA REPAIR Bilateral   . KNEE ARTHROSCOPY Bilateral   . TUBAL LIGATION     Family History  Problem Relation Age of Onset  . Alzheimer's disease Mother   . Breast cancer Mother   . Cancer Father        lung  . Dementia Sister   . Cancer Maternal Grandmother        breast  . Breast cancer Maternal Grandmother   . Heart attack Maternal Grandfather   . Healthy Sister   . Breast cancer Maternal Aunt   . Bladder Cancer Neg Hx   . Kidney cancer Neg Hx    Social History   Socioeconomic History  . Marital status: Divorced    Spouse name: Not on file  . Number of children: 2  . Years of education: some college  . Highest education level: 12th grade  Occupational History    Employer: OTHER    Comment: hairdresser  Tobacco Use  . Smoking status: Current Some Day Smoker    Packs/day: 0.25  Years: 52.00    Pack years: 13.00    Types: Cigarettes    Last attempt to quit: 03/20/2018    Years since quitting: 2.0  . Smokeless tobacco: Never Used  Vaping Use  . Vaping Use: Never used  Substance and Sexual Activity  . Alcohol use: Yes    Comment: socially  . Drug use: No  . Sexual activity: Not Currently    Partners: Female  Other Topics Concern  . Not on file  Social History Narrative   Pt lives alone   Social Determinants of Health   Financial Resource Strain: Low Risk   . Difficulty of Paying Living Expenses: Not hard at all  Food Insecurity: No Food Insecurity  . Worried About Charity fundraiser in the Last Year: Never true  . Ran Out of Food in the Last Year: Never  true  Transportation Needs: No Transportation Needs  . Lack of Transportation (Medical): No  . Lack of Transportation (Non-Medical): No  Physical Activity: Insufficiently Active  . Days of Exercise per Week: 1 day  . Minutes of Exercise per Session: 30 min  Stress: No Stress Concern Present  . Feeling of Stress : Not at all  Social Connections: Moderately Isolated  . Frequency of Communication with Friends and Family: More than three times a week  . Frequency of Social Gatherings with Friends and Family: More than three times a week  . Attends Religious Services: More than 4 times per year  . Active Member of Clubs or Organizations: No  . Attends Archivist Meetings: Never  . Marital Status: Divorced    Tobacco Counseling Ready to quit: Not Answered Counseling given: Not Answered   Clinical Intake:  Pre-visit preparation completed: Yes  Pain : No/denies pain     BMI - recorded: 21.44 Nutritional Status: BMI of 19-24  Normal Nutritional Risks: None Diabetes: No  How often do you need to have someone help you when you read instructions, pamphlets, or other written materials from your doctor or pharmacy?: 1 - Never    Interpreter Needed?: No  Information entered by :: Julie Marker LPN   Activities of Daily Living In your present state of health, do you have any difficulty performing the following activities: 04/24/2020 01/16/2020  Hearing? N N  Comment declines hearing aids -  Vision? N N  Difficulty concentrating or making decisions? N N  Walking or climbing stairs? N N  Dressing or bathing? N N  Doing errands, shopping? N N  Preparing Food and eating ? N -  Using the Toilet? N -  In the past six months, have you accidently leaked urine? N -  Do you have problems with loss of bowel control? N -  Managing your Medications? N -  Managing your Finances? N -  Housekeeping or managing your Housekeeping? N -  Some recent data might be hidden    Patient  Care Team: Towanda Malkin, MD as PCP - General (Internal Medicine)  Indicate any recent Medical Services you may have received from other than [facility] providers in the past year (date may be approximate).     Assessment:   This is a routine wellness examination for Julie Beck.  Hearing/Vision screen  Hearing Screening   125Hz  250Hz  500Hz  1000Hz  2000Hz  3000Hz  4000Hz  6000Hz  8000Hz   Right ear:           Left ear:           Comments: Pt denies hearing difficulty  Vision Screening Comments: Past due for eye exam  Dietary issues and exercise activities discussed: Current Exercise Habits: Home exercise routine, Type of exercise: walking, Time (Minutes): 30, Frequency (Times/Week): 1, Weekly Exercise (Minutes/Week): 30, Intensity: Mild, Exercise limited by: orthopedic condition(s)  Goals    . DIET - INCREASE WATER INTAKE     Recommend to drink at least 6-8 8oz glasses of water per day.      Depression Screen PHQ 2/9 Scores 04/24/2020 01/16/2020 11/16/2018 04/19/2018 10/27/2017 10/06/2017 07/12/2016  PHQ - 2 Score 0 0 0 0 0 0 0  PHQ- 9 Score - - - 0 - 0 -    Fall Risk Fall Risk  04/24/2020 01/16/2020 11/16/2018 04/19/2018 10/27/2017  Falls in the past year? 0 0 0 0 No  Number falls in past yr: 0 0 0 - -  Injury with Fall? 0 0 0 - -  Risk for fall due to : No Fall Risks - - - -  Risk for fall due to: Comment - - - - -  Follow up Falls prevention discussed Falls evaluation completed Falls prevention discussed - -    FALL RISK PREVENTION PERTAINING TO THE HOME:  Any stairs in or around the home? Yes  If so, are there any without handrails? No  Home free of loose throw rugs in walkways, pet beds, electrical cords, etc? Yes  Adequate lighting in your home to reduce risk of falls? Yes   ASSISTIVE DEVICES UTILIZED TO PREVENT FALLS:  Life alert? No  Use of a cane, walker or w/c? No  Grab bars in the bathroom? No  Shower chair or bench in shower? Yes  Elevated toilet seat or a handicapped  toilet? Yes   TIMED UP AND GO:  Was the test performed? Yes .  Length of time to ambulate 10 feet: 4 sec.   Gait steady and fast without use of assistive device  Cognitive Function: Normal cognitive status assessed by direct observation by this Nurse Health Advisor. No abnormalities found.       6CIT Screen 11/16/2018 10/06/2017  What Year? 0 points 0 points  What month? 0 points 0 points  What time? 0 points 0 points  Count back from 20 0 points 0 points  Months in reverse 0 points 0 points  Repeat phrase 0 points 0 points  Total Score 0 0    Immunizations Immunization History  Administered Date(s) Administered  . Influenza-Unspecified 06/23/2014  . PFIZER SARS-COV-2 Vaccination 07/12/2019, 08/02/2019  . Pneumococcal Conjugate-13 05/29/2014  . Tdap 06/23/2014     TDAP status: Up to date  Flu vaccine status: Due; pt requested to receive flu vaccine today but advised to wait 7-10 days for current symptoms to resolve.   Pneumococcal vaccine status: Declined,  Education has been provided regarding the importance of this vaccine but patient still declined. Advised may receive this vaccine at local pharmacy or Health Dept. Aware to provide a copy of the vaccination record if obtained from local pharmacy or Health Dept. Verbalized acceptance and understanding.   Covid-19 vaccine status: Completed vaccines  Qualifies for Shingles Vaccine? Yes   Zostavax completed No   Shingrix Completed?: No.    Education has been provided regarding the importance of this vaccine. Patient has been advised to call insurance company to determine out of pocket expense if they have not yet received this vaccine. Advised may also receive vaccine at local pharmacy or Health Dept. Verbalized acceptance and understanding.  Screening Tests Health Maintenance  Topic Date Due  . PNA vac Low Risk Adult (2 of 2 - PPSV23) 05/30/2015  . DEXA SCAN  04/22/2016  . INFLUENZA VACCINE  12/16/2019  . COVID-19  Vaccine (3 - Booster for Pfizer series) 02/02/2020  . COLONOSCOPY  01/15/2021 (Originally 11/29/2018)  . MAMMOGRAM  04/21/2021  . TETANUS/TDAP  06/23/2024  . Hepatitis C Screening  Completed    Health Maintenance  Health Maintenance Due  Topic Date Due  . PNA vac Low Risk Adult (2 of 2 - PPSV23) 05/30/2015  . DEXA SCAN  04/22/2016  . INFLUENZA VACCINE  12/16/2019  . COVID-19 Vaccine (3 - Booster for Pfizer series) 02/02/2020    Colorectal cancer screening: No longer required.   Mammogram status: Completed 04/21/20. Repeat every year  Bone Density status: Completed 04/22/14. Results reflect: Bone density results: OSTEOPENIA. Repeat every 2 years.  Lung Cancer Screening: (Low Dose CT Chest recommended if Age 31-80 years, 30 pack-year currently smoking OR have quit w/in 15years.) does qualify. Completed 07/11/19  Additional Screening:  Hepatitis C Screening: does qualify; Completed 07/12/16.   Vision Screening: Recommended annual ophthalmology exams for early detection of glaucoma and other disorders of the eye. Is the patient up to date with their annual eye exam?  No  Who is the provider or what is the name of the office in which the patient attends annual eye exams? Not established  Dental Screening: Recommended annual dental exams for proper oral hygiene  Community Resource Referral / Chronic Care Management: CRR required this visit?  No   CCM required this visit?  No      Plan:     I have personally reviewed and noted the following in the patient's chart:   . Medical and social history . Use of alcohol, tobacco or illicit drugs  . Current medications and supplements . Functional ability and status . Nutritional status . Physical activity . Advanced directives . List of other physicians . Hospitalizations, surgeries, and ER visits in previous 12 months . Vitals . Screenings to include cognitive, depression, and falls . Referrals and appointments  In addition, I  have reviewed and discussed with patient certain preventive protocols, quality metrics, and best practice recommendations. A written personalized care plan for preventive services as well as general preventive health recommendations were provided to patient.     Julie Marker, LPN   [id number]/4033   Nurse Notes: pt c/o sore throat, runny nose, drainage and temp of 99.4 today in office. Pt advised to wait until sxs clear before flu vaccine per Dr. Roxan Hockey. Pt's BP slightly elevated at time of visit; pt denies headaches, blurred vision or chest pain and no hx of consistent hypertension. Pt plans to check blood pressure at work over the next week and contact office if elevated.

## 2020-04-25 ENCOUNTER — Inpatient Hospital Stay
Admission: EM | Admit: 2020-04-25 | Discharge: 2020-04-27 | DRG: 645 | Disposition: A | Discharge status: Home / Self Care | Payer: Medicare Other | Attending: Internal Medicine | Admitting: Internal Medicine

## 2020-04-25 ENCOUNTER — Encounter: Payer: Self-pay | Admitting: Emergency Medicine

## 2020-04-25 ENCOUNTER — Emergency Department: Payer: Medicare Other

## 2020-04-25 DIAGNOSIS — I2510 Atherosclerotic heart disease of native coronary artery without angina pectoris: Secondary | ICD-10-CM | POA: Diagnosis present

## 2020-04-25 DIAGNOSIS — E785 Hyperlipidemia, unspecified: Secondary | ICD-10-CM

## 2020-04-25 DIAGNOSIS — Z882 Allergy status to sulfonamides status: Secondary | ICD-10-CM

## 2020-04-25 DIAGNOSIS — Z820 Family history of epilepsy and other diseases of the nervous system: Secondary | ICD-10-CM

## 2020-04-25 DIAGNOSIS — R55 Syncope and collapse: Secondary | ICD-10-CM | POA: Diagnosis not present

## 2020-04-25 DIAGNOSIS — J432 Centrilobular emphysema: Secondary | ICD-10-CM | POA: Diagnosis present

## 2020-04-25 DIAGNOSIS — E222 Syndrome of inappropriate secretion of antidiuretic hormone: Secondary | ICD-10-CM | POA: Diagnosis not present

## 2020-04-25 DIAGNOSIS — Z803 Family history of malignant neoplasm of breast: Secondary | ICD-10-CM

## 2020-04-25 DIAGNOSIS — I10 Essential (primary) hypertension: Secondary | ICD-10-CM | POA: Diagnosis present

## 2020-04-25 DIAGNOSIS — E041 Nontoxic single thyroid nodule: Secondary | ICD-10-CM

## 2020-04-25 DIAGNOSIS — F17210 Nicotine dependence, cigarettes, uncomplicated: Secondary | ICD-10-CM | POA: Diagnosis present

## 2020-04-25 DIAGNOSIS — R9082 White matter disease, unspecified: Secondary | ICD-10-CM | POA: Diagnosis present

## 2020-04-25 DIAGNOSIS — Z8249 Family history of ischemic heart disease and other diseases of the circulatory system: Secondary | ICD-10-CM

## 2020-04-25 DIAGNOSIS — E663 Overweight: Secondary | ICD-10-CM | POA: Diagnosis present

## 2020-04-25 DIAGNOSIS — W19XXXA Unspecified fall, initial encounter: Secondary | ICD-10-CM | POA: Diagnosis present

## 2020-04-25 DIAGNOSIS — E871 Hypo-osmolality and hyponatremia: Secondary | ICD-10-CM | POA: Diagnosis present

## 2020-04-25 DIAGNOSIS — Z720 Tobacco use: Secondary | ICD-10-CM | POA: Diagnosis present

## 2020-04-25 DIAGNOSIS — E7849 Other hyperlipidemia: Secondary | ICD-10-CM | POA: Diagnosis present

## 2020-04-25 DIAGNOSIS — I700 Atherosclerosis of aorta: Secondary | ICD-10-CM | POA: Diagnosis present

## 2020-04-25 DIAGNOSIS — Z20822 Contact with and (suspected) exposure to covid-19: Secondary | ICD-10-CM | POA: Diagnosis present

## 2020-04-25 DIAGNOSIS — Z79899 Other long term (current) drug therapy: Secondary | ICD-10-CM

## 2020-04-25 DIAGNOSIS — R911 Solitary pulmonary nodule: Secondary | ICD-10-CM

## 2020-04-25 DIAGNOSIS — E042 Nontoxic multinodular goiter: Secondary | ICD-10-CM

## 2020-04-25 LAB — BASIC METABOLIC PANEL
Anion gap: 13 (ref 5–15)
BUN: 19 mg/dL (ref 8–23)
CO2: 24 mmol/L (ref 22–32)
Calcium: 8.8 mg/dL — ABNORMAL LOW (ref 8.9–10.3)
Chloride: 85 mmol/L — ABNORMAL LOW (ref 98–111)
Creatinine, Ser: 0.81 mg/dL (ref 0.44–1.00)
GFR, Estimated: >60 mL/min (ref >60)
Glucose, Bld: 124 mg/dL — ABNORMAL HIGH (ref 70–99)
Potassium: 3.4 mmol/L — ABNORMAL LOW (ref 3.5–5.1)
Sodium: 122 mmol/L — ABNORMAL LOW (ref 135–145)

## 2020-04-25 LAB — URINALYSIS, COMPLETE (UACMP) WITH MICROSCOPIC
Bilirubin Urine: NEGATIVE
Glucose, UA: NEGATIVE mg/dL
Ketones, ur: 20 mg/dL — AB
Nitrite: NEGATIVE
Protein, ur: 100 mg/dL — AB
Specific Gravity, Urine: 1.016 (ref 1.005–1.030)
pH: 5 (ref 5.0–8.0)

## 2020-04-25 LAB — CBC
HCT: 35.6 % — ABNORMAL LOW (ref 36.0–46.0)
Hemoglobin: 12.3 g/dL (ref 12.0–15.0)
MCH: 32.3 pg (ref 26.0–34.0)
MCHC: 34.6 g/dL (ref 30.0–36.0)
MCV: 93.4 fL (ref 80.0–100.0)
Platelets: 169 10*3/uL (ref 150–400)
RBC: 3.81 MIL/uL — ABNORMAL LOW (ref 3.87–5.11)
RDW: 14.5 % (ref 11.5–15.5)
WBC: 4.6 10*3/uL (ref 4.0–10.5)
nRBC: 0 % (ref 0.0–0.2)

## 2020-04-25 LAB — OSMOLALITY, URINE: Osmolality, Ur: 527 mOsm/kg (ref 300–900)

## 2020-04-25 LAB — MAGNESIUM: Magnesium: 1.9 mg/dL (ref 1.7–2.4)

## 2020-04-25 LAB — SODIUM, URINE, RANDOM: Sodium, Ur: 65 mmol/L

## 2020-04-25 LAB — RESP PANEL BY RT-PCR (FLU A&B, COVID) ARPGX2
Influenza A by PCR: NEGATIVE
Influenza B by PCR: NEGATIVE
SARS Coronavirus 2 by RT PCR: NEGATIVE

## 2020-04-25 LAB — TROPONIN I (HIGH SENSITIVITY)
Troponin I (High Sensitivity): 8 ng/L (ref <18)
Troponin I (High Sensitivity): 8 ng/L (ref <18)

## 2020-04-25 LAB — OSMOLALITY: Osmolality: 262 mOsm/kg — ABNORMAL LOW (ref 275–295)

## 2020-04-25 MED ORDER — ONDANSETRON HCL 4 MG PO TABS: 4.0000 mg | ORAL_TABLET | Freq: Four times a day (QID) | ORAL | Status: DC | PRN

## 2020-04-25 MED ORDER — SODIUM CHLORIDE 0.9 % IV SOLN: INTRAVENOUS | Status: DC

## 2020-04-25 MED ORDER — SODIUM CHLORIDE 0.9 % IV BOLUS
1000.0000 mL | Freq: Once | INTRAVENOUS | Status: AC
  Administered 2020-04-25: 1000 mL via INTRAVENOUS

## 2020-04-25 MED ORDER — NICOTINE 14 MG/24HR TD PT24
14.0000 mg | MEDICATED_PATCH | Freq: Every day | TRANSDERMAL | Status: DC
  Administered 2020-04-25 – 2020-04-27 (×3): 14 mg via TRANSDERMAL
  Filled 2020-04-25 (×3): qty 1

## 2020-04-25 MED ORDER — ASPIRIN EC 81 MG PO TBEC
81.0000 mg | DELAYED_RELEASE_TABLET | Freq: Every day | ORAL | Status: DC
  Administered 2020-04-26 – 2020-04-27 (×2): 81 mg via ORAL
  Filled 2020-04-25 (×2): qty 1

## 2020-04-25 MED ORDER — ATORVASTATIN CALCIUM 20 MG PO TABS
80.0000 mg | ORAL_TABLET | Freq: Every day | ORAL | Status: DC
  Administered 2020-04-25 – 2020-04-26 (×2): 80 mg via ORAL
  Filled 2020-04-25 (×2): qty 4

## 2020-04-25 MED ORDER — POTASSIUM CHLORIDE CRYS ER 20 MEQ PO TBCR
40.0000 meq | EXTENDED_RELEASE_TABLET | Freq: Once | ORAL | Status: AC
  Administered 2020-04-25: 40 meq via ORAL
  Filled 2020-04-25: qty 2

## 2020-04-25 MED ORDER — EZETIMIBE 10 MG PO TABS
10.0000 mg | ORAL_TABLET | Freq: Every day | ORAL | Status: DC
  Administered 2020-04-26 (×2): 10 mg via ORAL
  Filled 2020-04-25 (×4): qty 1

## 2020-04-25 MED ORDER — SODIUM CHLORIDE 0.9% FLUSH
3.0000 mL | Freq: Two times a day (BID) | INTRAVENOUS | Status: DC
  Administered 2020-04-25 – 2020-04-27 (×4): 3 mL via INTRAVENOUS

## 2020-04-25 MED ORDER — ACETAMINOPHEN 650 MG RE SUPP: 650.0000 mg | Freq: Four times a day (QID) | RECTAL | Status: DC | PRN

## 2020-04-25 MED ORDER — ONDANSETRON HCL 4 MG/2ML IJ SOLN: 4.0000 mg | Freq: Four times a day (QID) | INTRAMUSCULAR | Status: DC | PRN

## 2020-04-25 MED ORDER — ACETAMINOPHEN 325 MG PO TABS: 650.0000 mg | ORAL_TABLET | Freq: Four times a day (QID) | ORAL | Status: DC | PRN

## 2020-04-25 MED ORDER — ENOXAPARIN SODIUM 40 MG/0.4ML ~~LOC~~ SOLN
40.0000 mg | SUBCUTANEOUS | Status: DC
  Administered 2020-04-25 – 2020-04-26 (×2): 40 mg via SUBCUTANEOUS
  Filled 2020-04-25 (×2): qty 0.4

## 2020-04-25 NOTE — ED Notes (Signed)
First Nurse Note: Pt to ED via [facility] from work for dizziness and fall. No LOC, did not hit head. Pt fell again at 1330. VSS, 12 lead WNL.

## 2020-04-25 NOTE — ED Provider Notes (Signed)
[facility] Emergency Department Provider Note ____________________________________________   Event Date/Time   First MD Initiated Contact with Patient 04/25/20 1747     (approximate)  I have reviewed the triage vital signs and the nursing notes.   HISTORY  Chief Complaint Near Syncope    HPI Julie Beck is a 74 y.o. female with PMH as noted below who presents with 2 episodes of syncope today, both acute onset while she was at work.  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  She states that she felt her vision becoming tunnel like, felt lightheaded, and then passed out.  This first occurred around 9:30 AM, and then again in the early afternoon.  The patient denies any associated chest pain, shortness of breath, nausea, or diaphoresis.  She states that over the last few days she has had some nasal congestion and a low-grade temperature, but this had improved today.  She states she feels fine now.  Past Medical History:  Diagnosis Date  . Abnormal CT of liver 07/12/2016   Chest CT Feb 2018  . Arthritis   . Centrilobular emphysema ([place]) 07/12/2016   Chest CT Feb 2018  . Coronary artery disease 07/12/2016   Followed by Dr. Saunders Revel, seen on chest CT Feb 2018  . Epigastric abdominal pain 03/17/2016  . Hyperlipidemia   . Overweight (BMI 25.0-29.9) 03/17/2016  . Thoracic aortic atherosclerosis ([place]) 07/12/2016   Chest CT scan Feb 2018  . Tobacco abuse 03/17/2016    Patient Active Problem List   Diagnosis Date Noted  . Syncope 04/25/2020  . Hyponatremia 04/25/2020  . Osteopenia 01/16/2020  . Polyp of colon 01/16/2020  . Cirrhosis of liver ([place]) 07/20/2016  . Centrilobular emphysema ([place]) 07/12/2016  . Thoracic aortic atherosclerosis ([place]) 07/12/2016  . Coronary artery disease 07/12/2016  . Personal history of tobacco use, presenting hazards to health 06/11/2016  . Positive colorectal cancer screening using DNA-based stool test 06/07/2016  .  Colon cancer screening 05/12/2016  . Postmenopausal 05/12/2016  . Screening for osteoporosis 05/12/2016  . Preventative health care 05/12/2016  . Hyperlipidemia, familial, high LDL 03/18/2016  . Medication monitoring encounter 03/18/2016  . Abnormal EKG 03/17/2016  . Tobacco abuse 03/17/2016  . GERD (gastroesophageal reflux disease) 03/17/2016  . Breast cancer screening 03/17/2016  . Overweight (BMI 25.0-29.9) 03/17/2016    Past Surgical History:  Procedure Laterality Date  . COLONOSCOPY WITH PROPOFOL N/A 11/28/2017   Procedure: COLONOSCOPY WITH PROPOFOL;  Surgeon: Lin Landsman, MD;  Location: [facility] ENDOSCOPY;  Service: Gastroenterology;  Laterality: N/A;  . INGUINAL HERNIA REPAIR Bilateral   . KNEE ARTHROSCOPY Bilateral   . TUBAL LIGATION      Prior to Admission medications   Medication Sig Start Date End Date Taking? Authorizing Provider  atorvastatin (LIPITOR) 80 MG tablet TAKE 1 TABLET(80 MG) BY MOUTH AT BEDTIME 03/10/20   Towanda Malkin, MD  calcium-vitamin D (OSCAL WITH D) 500-200 MG-UNIT tablet Take 1 tablet by mouth every morning.    [provider]  ezetimibe (ZETIA) 10 MG tablet TAKE 1 TABLET(10 MG) BY MOUTH DAILY 03/10/20   Towanda Malkin, MD  Multiple Vitamin (MULTIVITAMIN) tablet Take 1 tablet by mouth daily.    [provider]    Allergies Sulfa antibiotics  Family History  Problem Relation Age of Onset  . Alzheimer's disease Mother   . Breast cancer Mother   . Cancer Father        lung  . Dementia  Sister   . Cancer Maternal Grandmother        breast  . Breast cancer Maternal Grandmother   . Heart attack Maternal Grandfather   . Healthy Sister   . Breast cancer Maternal Aunt   . Bladder Cancer Neg Hx   . Kidney cancer Neg Hx     Social History Social History   Tobacco Use  . Smoking status: Current Some Day Smoker    Packs/day: 0.25    Years: 52.00    Pack years: 13.00    Types: Cigarettes    Last  attempt to quit: 03/20/2018    Years since quitting: 2.1  . Smokeless tobacco: Never Used  Vaping Use  . Vaping Use: Never used  Substance Use Topics  . Alcohol use: Yes    Comment: socially  . Drug use: No    Review of Systems  Constitutional: No fever/chills Eyes: No visual changes. ENT: No sore throat. Cardiovascular: Denies chest pain. Respiratory: Denies shortness of breath. Gastrointestinal: No vomiting or diarrhea.  Genitourinary: Negative for dysuria.  Musculoskeletal: Negative for back pain. Skin: Negative for rash. Neurological: Negative for headaches, focal weakness or numbness.   ____________________________________________   PHYSICAL EXAM:  VITAL SIGNS: ED Triage Vitals  Enc Vitals Group     BP 04/25/20 1509 (!) 141/57     Pulse Rate 04/25/20 1509 62     Resp 04/25/20 1509 16     Temp --      Temp src --      SpO2 04/25/20 1509 99 %     Weight 04/25/20 1510 141 lb (64 kg)     Height 04/25/20 1510 5\' 9"  (1.753 m)     Head Circumference --      Peak Flow --      Pain Score 04/25/20 1510 0     Pain Loc --      Pain Edu? --      Excl. in [place]? --     Constitutional: Alert and oriented. Well appearing and in no acute distress. Eyes: Conjunctivae are normal.  Head: Atraumatic. Nose: No congestion/rhinnorhea. Mouth/Throat: Mucous membranes are moist.   Neck: Normal range of motion.  Cardiovascular: Normal rate, regular rhythm. Grossly normal heart sounds.  Good peripheral circulation. Respiratory: Normal respiratory effort.  No retractions. Lungs CTAB. Gastrointestinal: No distention.  Musculoskeletal: Extremities warm and well perfused.  Neurologic:  Normal speech and language. No gross focal neurologic deficits are appreciated.  Skin:  Skin is warm and dry. No rash noted. Psychiatric: Mood and affect are normal. Speech and behavior are normal.  ____________________________________________   LABS (all labs ordered are listed, but only abnormal  results are displayed)  Labs Reviewed  BASIC METABOLIC PANEL - Abnormal; Notable for the following components:      Result Value   Sodium 122 (*)    Potassium 3.4 (*)    Chloride 85 (*)    Glucose, Bld 124 (*)    Calcium 8.8 (*)    All other components within normal limits  CBC - Abnormal; Notable for the following components:   RBC 3.81 (*)    HCT 35.6 (*)    All other components within normal limits  URINALYSIS, COMPLETE (UACMP) WITH MICROSCOPIC - Abnormal; Notable for the following components:   Color, Urine YELLOW (*)    APPearance HAZY (*)    Hgb urine dipstick MODERATE (*)    Ketones, ur 20 (*)    Protein, ur 100 (*)  Leukocytes,Ua SMALL (*)    Bacteria, UA RARE (*)    All other components within normal limits  RESP PANEL BY RT-PCR (FLU A&B, COVID) ARPGX2  TROPONIN I (HIGH SENSITIVITY)   ____________________________________________  EKG  ED ECG REPORT I, Arta Silence, the attending physician, personally viewed and interpreted this ECG.  Date: 04/25/2020 EKG Time: 1515 Rate: 61 Rhythm: normal sinus rhythm QRS Axis: normal Intervals: normal ST/T Wave abnormalities: normal Narrative Interpretation: no evidence of acute ischemia  ____________________________________________  RADIOLOGY  CT head: No ICH or other acute abnormality  ____________________________________________   PROCEDURES  Procedure(s) performed: No  Procedures  Critical Care performed: No ____________________________________________   INITIAL IMPRESSION / ASSESSMENT AND PLAN / ED COURSE  Pertinent labs & imaging results that were available during my care of the patient were reviewed by me and considered in my medical decision making (see chart for details).  74 year old female with PMH as noted above presents with 2 episodes of syncope today with a brief prodrome.  The patient is not sure if she really hit her head.  She had some URI type symptoms and a low-grade fever over  the last few days but not today.  She states she feels back to baseline now.  On exam, the patient is overall very well-appearing.  Her vital signs are normal.  Neurologic exam is nonfocal.  The physical exam is otherwise unremarkable.  EKG shows no acute abnormalities.  Initial lab work-up reveals fairly significant hyponatremia.  This is of unclear etiology.  The patient overall appears euvolemic.  The syncopal events are consistent with possible vasovagal episodes, however given the low sodium and the recurrent syncope without a clear trigger, I think it will be reasonable to obtain a CT head and admit the patient for monitoring and syncope work-up as well as repletion of the sodium.  ----------------------------------------- 7:49 PM on 04/25/2020 -----------------------------------------  CT shows no acute abnormality.  I discussed the case with hospitalist for admission.  ____________________________________________   FINAL CLINICAL IMPRESSION(S) / ED DIAGNOSES  Final diagnoses:  Syncope, unspecified syncope type  Hyponatremia      NEW MEDICATIONS STARTED DURING THIS VISIT:  New Prescriptions   No medications on file     Note:  This document was prepared using Dragon voice recognition software and may include unintentional dictation errors.    Arta Silence, MD 04/25/20 1949

## 2020-04-25 NOTE — ED Notes (Signed)
Report given via secure chat to floor nurse.

## 2020-04-25 NOTE — ED Notes (Signed)
Pt unhooked from monitor, given gown. Pt in bathroom at this time. Pt able to ambulate by self.

## 2020-04-25 NOTE — ED Triage Notes (Signed)
Pt to ED via [facility] from work for 2 near syncopal episodes. 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 Pt states that she did not hit her head. Pt did hit her left elbow. Pt is in NAD at this time. Pt denies any recent illnesses.

## 2020-04-25 NOTE — H&P (Signed)
History and Physical    Julie Beck:[id number] DOB: 03-16-1946 DOA: 04/25/2020  PCP: Towanda Malkin, MD  Patient coming from: Home via EMS  I have personally briefly reviewed patient's old medical records in [place]  Chief Complaint: syncope  HPI: Julie Beck is a 74 y.o. female with medical history significant for hyperlipidemia and tobacco use who presents to the ED for evaluation after syncopal episodes while at work.  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  While at work earlier today she had a syncopal episode around 9:30 AM.  She says she had been standing for at least 10 minutes prior to this episode when she had sudden change in vision described as a fuzzy or snow-like appearance.  She felt lightheaded and off-balance then fell to the ground.  She says she did not hit her head but did hit her left elbow without significant injury.  Coworkers rushed to her and she was immediately aroused and felt back to her usual self.  She had a second episode in similar fashion around 1:30 PM which prompted her to present to the ED for further evaluation.  Patient states that she did not experience any room spinning sensation, diaphoresis, nausea, vomiting, chest pain, palpitations, dyspnea, abdominal pain, dysuria, loss of bowel/bladder control.  She says she had a cough the last 2 days which she attributed to seasonal allergies and has resolved as of this morning.  She states that she has been eating and drinking well.  She denies any similar episodes in the past.  She says the only medications she is currently taking her Zetia, Lipitor, and vitamins.  She has not had any recent changes in her medicines.  She is a current smoker and over the last year has cut down from 2 packs/day to 1/3 pack/day.  She reports occasional alcohol use but none over the last week.  She denies any illicit drug use.  ED Course:  Initial vitals showed BP 159/76, pulse 67, RR 18, temp 97.8  F, SPO2 99% on room air.  Labs show sodium 122 (previously 134 01/16/2020), potassium 3.4, chloride 85, bicarb 24, BUN 19, creatinine 0.81, serum glucose 124, WBC 4.6, hemoglobin 12.3, platelets 169,000.  High-sensitivity troponin I 8.  Urinalysis shows negative nitrites, small leukocytes, 11-20 RBC/hpf, 6-10 WBC/hpf, rare bacteria microscopy.  SARS-CoV-2 PCR panel was collected and pending.  CT head without contrast was negative for acute intracranial abnormality.  Mild chronic ischemic white matter disease is noted.  Patient was given 1 L normal saline and the hospitalist service was consulted to admit for further evaluation and management.  Review of Systems: All systems reviewed and are negative except as documented in history of present illness above.   Past Medical History:  Diagnosis Date  . Abnormal CT of liver 07/12/2016   Chest CT Feb 2018  . Arthritis   . Centrilobular emphysema ([place]) 07/12/2016   Chest CT Feb 2018  . Coronary artery disease 07/12/2016   Followed by Dr. Saunders Revel, seen on chest CT Feb 2018  . Epigastric abdominal pain 03/17/2016  . Hyperlipidemia   . Overweight (BMI 25.0-29.9) 03/17/2016  . Thoracic aortic atherosclerosis ([place]) 07/12/2016   Chest CT scan Feb 2018  . Tobacco abuse 03/17/2016    Past Surgical History:  Procedure Laterality Date  . COLONOSCOPY WITH PROPOFOL N/A 11/28/2017   Procedure: COLONOSCOPY WITH PROPOFOL;  Surgeon: Lin Landsman, MD;  Location: [facility] ENDOSCOPY;  Service: Gastroenterology;  Laterality: N/A;  .  INGUINAL HERNIA REPAIR Bilateral   . KNEE ARTHROSCOPY Bilateral   . TUBAL LIGATION      Social History:  reports that she has been smoking cigarettes. She has a 13.00 pack-year smoking history. She has never used smokeless tobacco. She reports current alcohol use. She reports that she does not use drugs.  Allergies  Allergen Reactions  . Sulfa Antibiotics Hives    Family History  Problem Relation Age of Onset  . Alzheimer's  disease Mother   . Breast cancer Mother   . Cancer Father        lung  . Dementia Sister   . Cancer Maternal Grandmother        breast  . Breast cancer Maternal Grandmother   . Heart attack Maternal Grandfather   . Healthy Sister   . Breast cancer Maternal Aunt   . Bladder Cancer Neg Hx   . Kidney cancer Neg Hx      Prior to Admission medications   Medication Sig Start Date End Date Taking? Authorizing Provider  atorvastatin (LIPITOR) 80 MG tablet TAKE 1 TABLET(80 MG) BY MOUTH AT BEDTIME 03/10/20   Towanda Malkin, MD  calcium-vitamin D (OSCAL WITH D) 500-200 MG-UNIT tablet Take 1 tablet by mouth every morning.    [provider]  ezetimibe (ZETIA) 10 MG tablet TAKE 1 TABLET(10 MG) BY MOUTH DAILY 03/10/20   Towanda Malkin, MD  Multiple Vitamin (MULTIVITAMIN) tablet Take 1 tablet by mouth daily.    [provider]    Physical Exam: Vitals:   04/25/20 1509 04/25/20 1510 04/25/20 1851  BP: (!) 141/57  (!) 159/76  Pulse: 62  67  Resp: 16  18  Temp:   97.8 F (36.6 C)  TempSrc:   Oral  SpO2: 99%  99%  Weight:  64 kg   Height:  5\' 9"  (1.753 m)    Constitutional: Sitting up in bed, NAD, calm, comfortable Eyes: PERRL, lids and conjunctivae normal ENMT: Mucous membranes are moist. Posterior pharynx clear of any exudate or lesions.Normal dentition.  Neck: normal, supple, no masses. Respiratory: clear to auscultation bilaterally, no wheezing, no crackles. Normal respiratory effort. No accessory muscle use.  Cardiovascular: Regular rate and rhythm, no murmurs / rubs / gallops. No extremity edema. 2+ pedal pulses. Abdomen: no tenderness, no masses palpated. No hepatosplenomegaly. Bowel sounds positive.  Musculoskeletal: no clubbing / cyanosis. No joint deformity upper and lower extremities. Good ROM, no contractures. Normal muscle tone.  Skin: no rashes, lesions, ulcers. No induration Neurologic: CN 2-12 grossly intact. Sensation intact, Strength  5/5 in all 4.  Psychiatric: Normal judgment and insight. Alert and oriented x 3. Normal mood.   Labs on Admission: I have personally reviewed following labs and imaging studies  CBC: Recent Labs  Lab 04/25/20 1515  WBC 4.6  HGB 12.3  HCT 35.6*  MCV 93.4  PLT 086   Basic Metabolic Panel: Recent Labs  Lab 04/25/20 1515  NA 122*  K 3.4*  CL 85*  CO2 24  GLUCOSE 124*  BUN 19  CREATININE 0.81  CALCIUM 8.8*  MG 1.9   GFR: Estimated Creatinine Clearance: 63.4 mL/min (by C-G formula based on SCr of 0.81 mg/dL). Liver Function Tests: No results for input(s): AST, ALT, ALKPHOS, BILITOT, PROT, ALBUMIN in the last 168 hours. No results for input(s): LIPASE, AMYLASE in the last 168 hours. No results for input(s): AMMONIA in the last 168 hours. Coagulation Profile: No results for input(s): INR, PROTIME in the last 168  hours. Cardiac Enzymes: No results for input(s): CKTOTAL, CKMB, CKMBINDEX, TROPONINI in the last 168 hours. BNP (last 3 results) No results for input(s): PROBNP in the last 8760 hours. HbA1C: No results for input(s): HGBA1C in the last 72 hours. CBG: No results for input(s): GLUCAP in the last 168 hours. Lipid Profile: No results for input(s): CHOL, HDL, LDLCALC, TRIG, CHOLHDL, LDLDIRECT in the last 72 hours. Thyroid Function Tests: No results for input(s): TSH, T4TOTAL, FREET4, T3FREE, THYROIDAB in the last 72 hours. Anemia Panel: No results for input(s): VITAMINB12, FOLATE, FERRITIN, TIBC, IRON, RETICCTPCT in the last 72 hours. Urine analysis:    Component Value Date/Time   COLORURINE YELLOW (A) 04/25/2020 1836   APPEARANCEUR HAZY (A) 04/25/2020 1836   APPEARANCEUR Clear 01/27/2017 1250   LABSPEC 1.016 04/25/2020 1836   PHURINE 5.0 04/25/2020 1836   GLUCOSEU NEGATIVE 04/25/2020 1836   HGBUR MODERATE (A) 04/25/2020 1836   BILIRUBINUR NEGATIVE 04/25/2020 1836   BILIRUBINUR Negative 01/27/2017 1250   KETONESUR 20 (A) 04/25/2020 1836   PROTEINUR 100 (A)  04/25/2020 1836   UROBILINOGEN 1.0 11/18/2016 0812   NITRITE NEGATIVE 04/25/2020 1836   LEUKOCYTESUR SMALL (A) 04/25/2020 1836    Radiological Exams on Admission: CT Head Wo Contrast  Result Date: 04/25/2020 CLINICAL DATA:  Near syncope. EXAM: CT HEAD WITHOUT CONTRAST TECHNIQUE: Contiguous axial images were obtained from the base of the skull through the vertex without intravenous contrast. COMPARISON:  None. FINDINGS: Brain: Mild chronic ischemic white matter disease is noted. No mass effect or midline shift is noted. Ventricular size is within normal limits. There is no evidence of mass lesion, hemorrhage or acute infarction. Vascular: No hyperdense vessel or unexpected calcification. Skull: Normal. Negative for fracture or focal lesion. Sinuses/Orbits: No acute finding. Other: None. IMPRESSION: Mild chronic ischemic white matter disease. No acute intracranial abnormality seen. Electronically Signed   By: Marijo Conception M.D.   On: 04/25/2020 18:43    EKG: Personally reviewed. Normal sinus rhythm without acute ischemic changes.  Not significantly changed when compared to prior.  Assessment/Plan Principal Problem:   Syncope Active Problems:   Tobacco abuse   Hyperlipidemia, familial, high LDL   Coronary artery disease   Hyponatremia  Julie Beck is a 74 y.o. female with medical history significant for hyperlipidemia and tobacco use who is admitted after syncopal episodes  Syncope: History suggestive of orthostatic vasovagal syncope.  EKG shows sinus rhythm without acute changes.  CT head is negative for acute abnormality.  Not taking anything suggestive of medication related syncope. -Monitor on telemetry overnight -Check orthostatic vital signs -Discussed tensing/contracting muscles of leg/buttocks when standing -Consider compression stockings  Hyponatremia: Sodium 122 on admit compared to 134 3 months ago.  No clear etiology for hyponatremia.  Appears euvolemic on  admission. -Given 1 L normal saline in the ED -Place on IV NS @ 75 mL/hr overnight and repeat labs in a.m. -Check urine sodium and osmolality, serum osmolality  CAD: Seen on prior CT imaging.  Denies any chest pain.  Continue atorvastatin, Zetia, aspirin.  Hyperlipidemia: Continue atorvastatin, Zetia.  Tobacco use: Patient cutting back and motivated to quit completely.  Will try nicotine patch while in hospital.  DVT prophylaxis: Lovenox Code Status: Full code, confirmed with patient Family Communication: Discussed with patient, she has discussed with family Disposition Plan: From home and likely discharge to home pending adequate sodium correction Consults called: None Admission status:  Status is: Observation  The patient remains OBS appropriate and will d/c before  2 midnights.  Dispo: The patient is from: Home              Anticipated d/c is to: Home              Anticipated d/c date is: 1 day              Patient currently is not medically stable to d/c.  Zada Finders MD [facility]  If 7PM-7AM, please contact night-coverage [email or web address]  04/25/2020, 8:38 PM

## 2020-04-25 NOTE — ED Notes (Signed)
Request made for transport to rm [id number]

## 2020-04-26 ENCOUNTER — Encounter: Payer: Self-pay | Admitting: Internal Medicine

## 2020-04-26 ENCOUNTER — Other Ambulatory Visit: Payer: Self-pay

## 2020-04-26 ENCOUNTER — Observation Stay: Payer: Medicare Other

## 2020-04-26 DIAGNOSIS — Z79899 Other long term (current) drug therapy: Secondary | ICD-10-CM | POA: Diagnosis not present

## 2020-04-26 DIAGNOSIS — Z820 Family history of epilepsy and other diseases of the nervous system: Secondary | ICD-10-CM | POA: Diagnosis not present

## 2020-04-26 DIAGNOSIS — E871 Hypo-osmolality and hyponatremia: Secondary | ICD-10-CM | POA: Diagnosis not present

## 2020-04-26 DIAGNOSIS — F17210 Nicotine dependence, cigarettes, uncomplicated: Secondary | ICD-10-CM | POA: Diagnosis present

## 2020-04-26 DIAGNOSIS — I2510 Atherosclerotic heart disease of native coronary artery without angina pectoris: Secondary | ICD-10-CM | POA: Diagnosis present

## 2020-04-26 DIAGNOSIS — E785 Hyperlipidemia, unspecified: Secondary | ICD-10-CM | POA: Diagnosis present

## 2020-04-26 DIAGNOSIS — R911 Solitary pulmonary nodule: Secondary | ICD-10-CM

## 2020-04-26 DIAGNOSIS — Z803 Family history of malignant neoplasm of breast: Secondary | ICD-10-CM | POA: Diagnosis not present

## 2020-04-26 DIAGNOSIS — E222 Syndrome of inappropriate secretion of antidiuretic hormone: Secondary | ICD-10-CM | POA: Diagnosis present

## 2020-04-26 DIAGNOSIS — Z720 Tobacco use: Secondary | ICD-10-CM

## 2020-04-26 DIAGNOSIS — W19XXXA Unspecified fall, initial encounter: Secondary | ICD-10-CM | POA: Diagnosis present

## 2020-04-26 DIAGNOSIS — Z882 Allergy status to sulfonamides status: Secondary | ICD-10-CM | POA: Diagnosis not present

## 2020-04-26 DIAGNOSIS — I700 Atherosclerosis of aorta: Secondary | ICD-10-CM | POA: Diagnosis present

## 2020-04-26 DIAGNOSIS — Z20822 Contact with and (suspected) exposure to covid-19: Secondary | ICD-10-CM | POA: Diagnosis present

## 2020-04-26 DIAGNOSIS — E663 Overweight: Secondary | ICD-10-CM | POA: Diagnosis present

## 2020-04-26 DIAGNOSIS — E041 Nontoxic single thyroid nodule: Secondary | ICD-10-CM

## 2020-04-26 DIAGNOSIS — I10 Essential (primary) hypertension: Secondary | ICD-10-CM | POA: Diagnosis present

## 2020-04-26 DIAGNOSIS — J432 Centrilobular emphysema: Secondary | ICD-10-CM | POA: Diagnosis present

## 2020-04-26 DIAGNOSIS — R9082 White matter disease, unspecified: Secondary | ICD-10-CM | POA: Diagnosis present

## 2020-04-26 DIAGNOSIS — Z8249 Family history of ischemic heart disease and other diseases of the circulatory system: Secondary | ICD-10-CM | POA: Diagnosis not present

## 2020-04-26 DIAGNOSIS — R55 Syncope and collapse: Secondary | ICD-10-CM | POA: Diagnosis present

## 2020-04-26 LAB — BASIC METABOLIC PANEL
Anion gap: 7 (ref 5–15)
BUN: 15 mg/dL (ref 8–23)
CO2: 23 mmol/L (ref 22–32)
Calcium: 8.2 mg/dL — ABNORMAL LOW (ref 8.9–10.3)
Chloride: 92 mmol/L — ABNORMAL LOW (ref 98–111)
Creatinine, Ser: 0.61 mg/dL (ref 0.44–1.00)
GFR, Estimated: >60 mL/min (ref >60)
Glucose, Bld: 88 mg/dL (ref 70–99)
Potassium: 3.7 mmol/L (ref 3.5–5.1)
Sodium: 122 mmol/L — ABNORMAL LOW (ref 135–145)

## 2020-04-26 LAB — HEPATIC FUNCTION PANEL
ALT: 16 U/L (ref 0–44)
AST: 30 U/L (ref 15–41)
Albumin: 3.2 g/dL — ABNORMAL LOW (ref 3.5–5.0)
Alkaline Phosphatase: 72 U/L (ref 38–126)
Bilirubin, Direct: 0.1 mg/dL (ref 0.0–0.2)
Indirect Bilirubin: 0.8 mg/dL (ref 0.3–0.9)
Total Bilirubin: 0.9 mg/dL (ref 0.3–1.2)
Total Protein: 6.7 g/dL (ref 6.5–8.1)

## 2020-04-26 LAB — SODIUM
Sodium: 123 mmol/L — ABNORMAL LOW (ref 135–145)
Sodium: 128 mmol/L — ABNORMAL LOW (ref 135–145)

## 2020-04-26 LAB — GLUCOSE, CAPILLARY: Glucose-Capillary: 80 mg/dL (ref 70–99)

## 2020-04-26 MED ORDER — IOHEXOL 300 MG/ML  SOLN
75.0000 mL | Freq: Once | INTRAMUSCULAR | Status: AC | PRN
  Administered 2020-04-26: 10:00:00 75 mL via INTRAVENOUS

## 2020-04-26 MED ORDER — SODIUM CHLORIDE 0.9 % IV SOLN: INTRAVENOUS | Status: DC

## 2020-04-26 MED ORDER — SODIUM CHLORIDE 0.9 % IV BOLUS
500.0000 mL | Freq: Once | INTRAVENOUS | Status: AC
  Administered 2020-04-26: 11:00:00 500 mL via INTRAVENOUS

## 2020-04-26 MED ORDER — TOLVAPTAN 15 MG PO TABS
15.0000 mg | ORAL_TABLET | Freq: Once | ORAL | Status: AC
  Administered 2020-04-26: 16:00:00 15 mg via ORAL
  Filled 2020-04-26: qty 1

## 2020-04-26 NOTE — Progress Notes (Signed)
  Chaplain On-Call responded to Order Requisition for Advance Directives information.  Chaplain provided the documents and education to the patient and her son at bedside. Encouraged reading and discussion.  [place] Nery Kalisz M.Div., [facility]

## 2020-04-26 NOTE — Progress Notes (Signed)
Patient ID: Julie Beck, female   DOB: 04-05-1946, 74 y.o.   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  Both times she did see what she described as crystals floating in front of both eyes and did not feel well with dizziness and ended up passing out.  She feels better now that her sodium was low on presentation and still low despite IV fluids.  Objective: Vitals:   04/26/20 0838 04/26/20 1143  BP: (!) 140/55 (!) 154/69  Pulse: 63 60  Resp:  16  Temp: 98.3 F (36.8 C) 98.1 F (36.7 C)  SpO2: 99% 99%    Intake/Output Summary (Last 24 hours) at 04/26/2020 1257 Last data filed at 04/26/2020 0649 Gross per 24 hour  Intake 651.34 ml  Output 100 ml  Net 551.34 ml   Filed Weights   04/25/20 1510 04/25/20 2132 04/26/20 0414  Weight: 64 kg 64.9 kg 64 kg    ROS: Review of Systems  Respiratory: Negative for cough and shortness of breath.   Gastrointestinal: Negative for abdominal pain, nausea and vomiting.  Neurological: Positive for dizziness.   Exam: Physical Exam HENT:     Head: Normocephalic.     Mouth/Throat:     Pharynx: No oropharyngeal exudate.  Eyes:     General: Lids are normal.     Conjunctiva/sclera: Conjunctivae normal.     Pupils: Pupils are equal, round, and reactive to light.  Cardiovascular:     Rate and Rhythm: Normal rate and regular rhythm.     Heart sounds: Normal heart sounds, S1 normal and S2 normal.  Pulmonary:     Breath sounds: No decreased breath sounds, wheezing, rhonchi or rales.  Abdominal:     Palpations: Abdomen is soft.     Tenderness: There is no abdominal tenderness.  Musculoskeletal:     Right ankle: No swelling.     Left ankle: No swelling.  Skin:    General: Skin is warm.     Findings: No rash.  Neurological:     Mental Status: She  is alert and oriented to person, place, and time.       Data Reviewed: Basic Metabolic Panel: Recent Labs  Lab 04/25/20 1515 04/26/20 0437 04/26/20 1200  NA 122* 122* 123*  K 3.4* 3.7  --   CL 85* 92*  --   CO2 24 23  --   GLUCOSE 124* 88  --   BUN 19 15  --   CREATININE 0.81 0.61  --   CALCIUM 8.8* 8.2*  --   MG 1.9  --   --    CBC: Recent Labs  Lab 04/25/20 1515  WBC 4.6  HGB 12.3  HCT 35.6*  MCV 93.4  PLT 169    CBG: Recent Labs  Lab 04/26/20 0414  GLUCAP 80    Recent Results (from the past 240 hour(s))  Resp Panel by RT-PCR (Flu A&B, Covid) Nasopharyngeal Swab     Status: None   Collection Time: 04/25/20  6:36 PM   Specimen: Nasopharyngeal Swab; Nasopharyngeal(NP) swabs in vial transport medium  Result Value Ref Range Status   SARS Coronavirus 2 by RT PCR NEGATIVE NEGATIVE Final    Comment: (NOTE) SARS-CoV-2 target nucleic acids are NOT DETECTED.  The SARS-CoV-2 RNA is generally detectable in upper respiratory specimens during the acute  phase of infection. The lowest concentration of SARS-CoV-2 viral copies this assay can detect is 138 copies/mL. A negative result does not preclude SARS-Cov-2 infection and should not be used as the sole basis for treatment or other patient management decisions. A negative result may occur with  improper specimen collection/handling, submission of specimen other than nasopharyngeal swab, presence of viral mutation(s) within the areas targeted by this assay, and inadequate number of viral copies(<138 copies/mL). A negative result must be combined with clinical observations, patient history, and epidemiological information. The expected result is Negative.  Fact Sheet for Patients:  EntrepreneurPulse.com.au  Fact Sheet for Healthcare Providers:  IncredibleEmployment.be  This test is no t yet approved or cleared by the [place] FDA and  has been authorized for detection  and/or diagnosis of SARS-CoV-2 by FDA under an Emergency Use Authorization (EUA). This EUA will remain  in effect (meaning this test can be used) for the duration of the COVID-19 declaration under Section 564(b)(1) of the Act, 21 U.S.C.section 360bbb-3(b)(1), unless the authorization is terminated  or revoked sooner.       Influenza A by PCR NEGATIVE NEGATIVE Final   Influenza B by PCR NEGATIVE NEGATIVE Final    Comment: (NOTE) The Xpert Xpress SARS-CoV-2/FLU/RSV plus assay is intended as an aid in the diagnosis of influenza from Nasopharyngeal swab specimens and should not be used as a sole basis for treatment. Nasal washings and aspirates are unacceptable for Xpert Xpress SARS-CoV-2/FLU/RSV testing.  Fact Sheet for Patients: EntrepreneurPulse.com.au  Fact Sheet for Healthcare Providers: IncredibleEmployment.be  This test is not yet approved or cleared by the [place] FDA and has been authorized for detection and/or diagnosis of SARS-CoV-2 by FDA under an Emergency Use Authorization (EUA). This EUA will remain in effect (meaning this test can be used) for the duration of the COVID-19 declaration under Section 564(b)(1) of the Act, 21 U.S.C. section 360bbb-3(b)(1), unless the authorization is terminated or revoked.  Performed at [facility], [address] [place], [place] [address]      Studies: CT Head Wo Contrast  Result Date: 04/25/2020 CLINICAL DATA:  Near syncope. EXAM: CT HEAD WITHOUT CONTRAST TECHNIQUE: Contiguous axial images were obtained from the base of the skull through the vertex without intravenous contrast. COMPARISON:  None. FINDINGS: Brain: Mild chronic ischemic white matter disease is noted. No mass effect or midline shift is noted. Ventricular size is within normal limits. There is no evidence of mass lesion, hemorrhage or acute infarction. Vascular: No hyperdense vessel or unexpected calcification.  Skull: Normal. Negative for fracture or focal lesion. Sinuses/Orbits: No acute finding. Other: None. IMPRESSION: Mild chronic ischemic white matter disease. No acute intracranial abnormality seen. Electronically Signed   By: Marijo Conception M.D.   On: 04/25/2020 18:43   CT CHEST W CONTRAST  Result Date: 04/26/2020 CLINICAL DATA:  74 year old female presenting with hyponatremia and unintended weight loss. EXAM: CT CHEST WITH CONTRAST TECHNIQUE: Multidetector CT imaging of the chest was performed during intravenous contrast administration. CONTRAST:  83mL OMNIPAQUE IOHEXOL 300 MG/ML  SOLN COMPARISON:  07/11/2019, 06/09/2016 FINDINGS: Cardiovascular: Heart is normal in size. No pericardial effusion. Severe coronary atherosclerotic calcifications. Coarse atherosclerotic calcifications in the aortic arch descending thoracic and proximal abdominal aorta. Patent central veins. Mediastinum/Nodes: No enlarged mediastinal, hilar, or axillary lymph nodes. 1.5 cm hypoattenuating left upper thyroid nodule. The trachea and esophagus demonstrate no significant findings. Lungs/Pleura: Unchanged bilateral apical scarring. Similar appearing upper lobe predominant moderate centrilobular emphysema. Similar appearing posterior left upper  lobe ground-glass nodule measuring up to 1.6 cm. Unchanged lingular solid pulmonary nodule measuring up to 0.5 cm. No new suspicious pulmonary nodules. No consolidative opacity, pleural effusion, or pneumothorax. Upper Abdomen: The visualized upper abdomen is within normal limits. Musculoskeletal: Similar appearing sigmoid curvature of the thoracic spine. Osteopenia. No acute osseous abnormality. No aggressive appearing osseous lesion. IMPRESSION: 1. No acute intrathoracic abnormality. 2. Ground-glass solid pulmonary nodule in the posterior left upper lobe measuring up to 1.6 cm, slightly increased in conspicuity since 2018 but overall unchanged in size. Repeat CT is recommended every 2 years until  5 years of stability has been established. This recommendation follows the consensus statement: Guidelines for Management of Incidental Pulmonary Nodules Detected on CT Images: From the Fleischner Society 2017; Radiology 2017; 284:228-243. 3. Indeterminate left superior thyroid nodule measuring up to 1.5 cm. Recommend dedicated thyroid ultrasound for further characterization. 4. Aortic Atherosclerosis (ICD10-I70.0) and Emphysema (ICD10-J43.9). Electronically Signed   By: Ruthann Cancer MD   On: 04/26/2020 10:25    Scheduled Meds: . aspirin EC  81 mg Oral Daily  . atorvastatin  80 mg Oral QHS  . enoxaparin (LOVENOX) injection  40 mg Subcutaneous Q24H  . ezetimibe  10 mg Oral QHS  . nicotine  14 mg Transdermal Daily  . sodium chloride flush  3 mL Intravenous Q12H  . tolvaptan  15 mg Oral Once    Assessment/Plan:  1. Severe hyponatremia.  Despite IV fluids overnight and this morning sodium still the same at 122.  I gave a another fluid bolus and sodium only improved to 123.  Case discussed with nephrology and will give a dose of tolvaptan.  Urine osmolarity and urine sodium levels high, which is consistent with SIADH.  CT scan of the chest does show a pulmonary nodule which will have to be followed up as outpatient. 2. Syncope x2.  Monitor on telemetry. 3. Pulmonary nodule will need follow-up as outpatient likely PET CT scan. 4. CT scan showed a nodule in the thyroid.  Will check TSH and free T4 tomorrow morning.  Will need outpatient thyroid ultrasound as outpatient 5. Hyperlipidemia unspecified on atorvastatin and Zetia 6. Tobacco abuse.  Nicotine patch.     Code Status:     Code Status Orders  (From admission, onward)         Start     Ordered   04/25/20 2027  Full code  Continuous        04/25/20 2029        Code Status History    This patient has a current code status but no historical code status.   Advance Care Planning Activity     Family Communication: Spoke with  patient's son on the phone Disposition Plan: Status is: Inpatient  Dispo: The patient is from: Home              Anticipated d/c is to: Home              Anticipated d/c date is: Potentially 04/27/2020 if sodium better              Patient currently being treated for hyponatremia and trying a dose of tolvaptan today.  Consultants:  Nephrology  Time spent: 29 minutes, case discussed with nephrology  [place]

## 2020-04-26 NOTE — Progress Notes (Addendum)
MEDICATION RELATED CONSULT NOTE - FOLLOW UP   Pharmacy Consult for Tolvaptan Indication: Hyponatremia  Allergies  Allergen Reactions  . Sulfa Antibiotics Hives    Patient Measurements: Height: 5\' 7"  (170.2 cm) Weight: 64 kg (141 lb 1.5 oz) IBW/kg (Calculated) : 61.6  Vital Signs: Temp: 98.8 F (37.1 C) (12/11 1947) Temp Source: Oral (12/11 1947) BP: 164/68 (12/11 1947) Pulse Rate: 70 (12/11 1947) Intake/Output from previous day: 12/10 0701 - 12/11 0700 In: 651.3 [I.V.:651.3] Out: 100 [Urine:100] Intake/Output from this shift: Total I/O In: -  Out: 900 [Urine:900]  Labs: Recent Labs    04/25/20 1515 04/26/20 0437 04/26/20 1200  WBC 4.6  --   --   HGB 12.3  --   --   HCT 35.6*  --   --   PLT 169  --   --   CREATININE 0.81 0.61  --   MG 1.9  --   --   ALBUMIN  --   --  3.2*  PROT  --   --  6.7  AST  --   --  30  ALT  --   --  16  ALKPHOS  --   --  72  BILITOT  --   --  0.9  BILIDIR  --   --  0.1  IBILI  --   --  0.8   Estimated Creatinine Clearance: 61.8 mL/min (by C-G formula based on SCr of 0.61 mg/dL).   Assessment: 74 yo female with hyponatremia (Na 123) to start on tolvaptan.  12/11 tolvaptan 15 mg PO x1 @1624   Na 122 >> 123 >> 128 (@2051 ; increased by by 5 mmol)  Goal of Therapy:  Desired rate of correction: should not exceed 8 mEq/L per 8 hours or 12 mEq/L in 24 hours.   Plan:  Monitor Na level q8h x24h then daily - Hold and notify MD if Na increases more than 8 mEq/L in 8h or greater than 12 mEq/L in 24h.   Rowland Lathe 04/26/2020,10:03 PM

## 2020-04-26 NOTE — Plan of Care (Signed)

## 2020-04-26 NOTE — Consult Note (Signed)
[facility]  CONSULT NOTE    Date: 04/26/2020                  Patient Name:  Julie Beck  MRN: [id number]  DOB: May 03, 1946  Age / Sex: 74 y.o., female         PCP: Towanda Malkin, MD                 Service Requesting Consult: Dr. 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 Patient admitted for syncope. Patient was standing when she lost consciousness. She states this happened twice. Reports no changes to medications. No use of diuretics. States she is eating well and has no nausea/vomiting or diarrhea.    Medications: Outpatient medications: Medications Prior to Admission  Medication Sig Dispense Refill Last Dose  . atorvastatin (LIPITOR) 80 MG tablet TAKE 1 TABLET(80 MG) BY MOUTH AT BEDTIME (Patient taking differently: Take 80 mg by mouth at bedtime.) 90 tablet 3   . ezetimibe (ZETIA) 10 MG tablet TAKE 1 TABLET(10 MG) BY MOUTH DAILY (Patient taking differently: Take 10 mg by mouth at bedtime.) 90 tablet 3   . Multiple Vitamin (MULTIVITAMIN) tablet Take 1 tablet by mouth daily.       Current medications: Current Facility-Administered Medications  Medication Dose Route Frequency Provider Last Rate Last Admin  . 0.9 %  sodium chloride infusion   Intravenous Continuous Wieting, Richard, MD      . acetaminophen (TYLENOL) tablet 650 mg  650 mg Oral Q6H PRN Lenore Cordia, MD       Or  . acetaminophen (TYLENOL) suppository 650 mg  650 mg Rectal Q6H PRN Lenore Cordia, MD      . aspirin EC tablet 81 mg  81 mg Oral Daily Zada Finders R, MD   81 mg at 04/26/20 1104  . atorvastatin (LIPITOR) tablet 80 mg  80 mg Oral QHS Lenore Cordia, MD   80 mg at 04/25/20 2208  . enoxaparin (LOVENOX) injection 40 mg  40 mg Subcutaneous Q24H Zada Finders R, MD   40 mg at 04/25/20 2209  . ezetimibe (ZETIA) tablet 10 mg  10 mg Oral QHS Zada Finders R, MD   10 mg at  04/26/20 0017  . nicotine (NICODERM CQ - dosed in mg/24 hours) patch 14 mg  14 mg Transdermal Daily Lenore Cordia, MD   14 mg at 04/26/20 1104  . ondansetron (ZOFRAN) tablet 4 mg  4 mg Oral Q6H PRN Lenore Cordia, MD       Or  . ondansetron (ZOFRAN) injection 4 mg  4 mg Intravenous Q6H PRN Zada Finders R, MD      . sodium chloride flush (NS) 0.9 % injection 3 mL  3 mL Intravenous Q12H Zada Finders R, MD   3 mL at 04/26/20 1105      Allergies: Allergies  Allergen Reactions  . Sulfa Antibiotics Hives      Past Medical History: Past Medical History:  Diagnosis Date  . Abnormal CT of liver 07/12/2016   Chest CT Feb 2018  . Arthritis   . Centrilobular emphysema ([place]) 07/12/2016   Chest CT Feb 2018  . Coronary artery disease 07/12/2016   Followed by Dr. Saunders Revel, seen on chest  CT Feb 2018  . Epigastric abdominal pain 03/17/2016  . Hyperlipidemia   . Overweight (BMI 25.0-29.9) 03/17/2016  . Thoracic aortic atherosclerosis ([place]) 07/12/2016   Chest CT scan Feb 2018  . Tobacco abuse 03/17/2016     Past Surgical History: Past Surgical History:  Procedure Laterality Date  . COLONOSCOPY WITH PROPOFOL N/A 11/28/2017   Procedure: COLONOSCOPY WITH PROPOFOL;  Surgeon: Lin Landsman, MD;  Location: [facility] ENDOSCOPY;  Service: Gastroenterology;  Laterality: N/A;  . INGUINAL HERNIA REPAIR Bilateral   . KNEE ARTHROSCOPY Bilateral   . TUBAL LIGATION       Family History: Family History  Problem Relation Age of Onset  . Alzheimer's disease Mother   . Breast cancer Mother   . Cancer Father        lung  . Dementia Sister   . Cancer Maternal Grandmother        breast  . Breast cancer Maternal Grandmother   . Heart attack Maternal Grandfather   . Healthy Sister   . Breast cancer Maternal Aunt   . Bladder Cancer Neg Hx   . Kidney cancer Neg Hx      Social History: Social History   Socioeconomic History  . Marital status: Divorced    Spouse name: Not on file  . Number of  children: 2  . Years of education: some college  . Highest education level: 12th grade  Occupational History    Employer: OTHER    Comment: hairdresser  Tobacco Use  . Smoking status: Current Some Day Smoker    Packs/day: 0.25    Years: 52.00    Pack years: 13.00    Types: Cigarettes    Last attempt to quit: 03/20/2018    Years since quitting: 2.1  . Smokeless tobacco: Never Used  Vaping Use  . Vaping Use: Never used  Substance and Sexual Activity  . Alcohol use: Yes    Comment: socially  . Drug use: No  . Sexual activity: Not Currently    Partners: Female  Other Topics Concern  . Not on file  Social History Narrative   Pt lives alone   Social Determinants of Health   Financial Resource Strain: Low Risk   . Difficulty of Paying Living Expenses: Not hard at all  Food Insecurity: No Food Insecurity  . Worried About Charity fundraiser in the Last Year: Never true  . Ran Out of Food in the Last Year: Never true  Transportation Needs: No Transportation Needs  . Lack of Transportation (Medical): No  . Lack of Transportation (Non-Medical): No  Physical Activity: Insufficiently Active  . Days of Exercise per Week: 1 day  . Minutes of Exercise per Session: 30 min  Stress: No Stress Concern Present  . Feeling of Stress : Not at all  Social Connections: Moderately Isolated  . Frequency of Communication with Friends and Family: More than three times a week  . Frequency of Social Gatherings with Friends and Family: More than three times a week  . Attends Religious Services: More than 4 times per year  . Active Member of Clubs or Organizations: No  . Attends Archivist Meetings: Never  . Marital Status: Divorced  Human resources officer Violence: Not At Risk  . Fear of Current or Ex-Partner: No  . Emotionally Abused: No  . Physically Abused: No  . Sexually Abused: No     Review of Systems: Review of Systems  Constitutional: Negative.   HENT: Negative.   Eyes:  Negative.   Respiratory: Negative for cough, hemoptysis, sputum production, shortness of breath and wheezing.   Cardiovascular: Negative for chest pain, palpitations, orthopnea, claudication, leg swelling and PND.  Gastrointestinal: Negative.   Genitourinary: Negative for dysuria, flank pain, frequency, hematuria and urgency.  Musculoskeletal: Negative for back pain, falls, joint pain, myalgias and neck pain.  Skin: Negative.   Neurological: Negative.   Endo/Heme/Allergies: Negative.   Psychiatric/Behavioral: Negative.     Vital Signs: Blood pressure (!) 154/69, pulse 60, temperature 98.1 F (36.7 C), resp. rate 16, height 5\' 7"  (1.702 m), weight 64 kg, SpO2 99 %.  Weight trends: Filed Weights   04/25/20 1510 04/25/20 2132 04/26/20 0414  Weight: 64 kg 64.9 kg 64 kg    Physical Exam: General: NAD,   Head: Normocephalic, atraumatic. Dry oral mucosal membranes  Eyes: Anicteric, PERRL  Neck: Supple, trachea midline  Lungs:  Clear to auscultation  Heart: Regular rate and rhythm  Abdomen:  Soft, nontender,   Extremities:  no peripheral edema.  Neurologic: Nonfocal, moving all four extremities  Skin: No lesions         Lab results: Basic Metabolic Panel: Recent Labs  Lab 04/25/20 1515 04/26/20 0437 04/26/20 1200  NA 122* 122* 123*  K 3.4* 3.7  --   CL 85* 92*  --   CO2 24 23  --   GLUCOSE 124* 88  --   BUN 19 15  --   CREATININE 0.81 0.61  --   CALCIUM 8.8* 8.2*  --   MG 1.9  --   --     Liver Function Tests: No results for input(s): AST, ALT, ALKPHOS, BILITOT, PROT, ALBUMIN in the last 168 hours. No results for input(s): LIPASE, AMYLASE in the last 168 hours. No results for input(s): AMMONIA in the last 168 hours.  CBC: Recent Labs  Lab 04/25/20 1515  WBC 4.6  HGB 12.3  HCT 35.6*  MCV 93.4  PLT 169    Cardiac Enzymes: No results for input(s): CKTOTAL, CKMB, CKMBINDEX, TROPONINI in the last 168 hours.  BNP: Invalid input(s): POCBNP  CBG: Recent  Labs  Lab 04/26/20 0414  GLUCAP 80    Microbiology: Results for orders placed or performed during the hospital encounter of 04/25/20  Resp Panel by RT-PCR (Flu A&B, Covid) Nasopharyngeal Swab     Status: None   Collection Time: 04/25/20  6:36 PM   Specimen: Nasopharyngeal Swab; Nasopharyngeal(NP) swabs in vial transport medium  Result Value Ref Range Status   SARS Coronavirus 2 by RT PCR NEGATIVE NEGATIVE Final    Comment: (NOTE) SARS-CoV-2 target nucleic acids are NOT DETECTED.  The SARS-CoV-2 RNA is generally detectable in upper respiratory specimens during the acute phase of infection. The lowest concentration of SARS-CoV-2 viral copies this assay can detect is 138 copies/mL. A negative result does not preclude SARS-Cov-2 infection and should not be used as the sole basis for treatment or other patient management decisions. A negative result may occur with  improper specimen collection/handling, submission of specimen other than nasopharyngeal swab, presence of viral mutation(s) within the areas targeted by this assay, and inadequate number of viral copies(<138 copies/mL). A negative result must be combined with clinical observations, patient history, and epidemiological information. The expected result is Negative.  Fact Sheet for Patients:  EntrepreneurPulse.com.au  Fact Sheet for Healthcare Providers:  IncredibleEmployment.be  This test is no t yet approved or cleared by the [place] FDA and  has been authorized for detection and/or diagnosis of SARS-CoV-2  by FDA under an Emergency Use Authorization (EUA). This EUA will remain  in effect (meaning this test can be used) for the duration of the COVID-19 declaration under Section 564(b)(1) of the Act, 21 U.S.C.section 360bbb-3(b)(1), unless the authorization is terminated  or revoked sooner.       Influenza A by PCR NEGATIVE NEGATIVE Final   Influenza B by PCR NEGATIVE NEGATIVE  Final    Comment: (NOTE) The Xpert Xpress SARS-CoV-2/FLU/RSV plus assay is intended as an aid in the diagnosis of influenza from Nasopharyngeal swab specimens and should not be used as a sole basis for treatment. Nasal washings and aspirates are unacceptable for Xpert Xpress SARS-CoV-2/FLU/RSV testing.  Fact Sheet for Patients: EntrepreneurPulse.com.au  Fact Sheet for Healthcare Providers: IncredibleEmployment.be  This test is not yet approved or cleared by the [place] FDA and has been authorized for detection and/or diagnosis of SARS-CoV-2 by FDA under an Emergency Use Authorization (EUA). This EUA will remain in effect (meaning this test can be used) for the duration of the COVID-19 declaration under Section 564(b)(1) of the Act, 21 U.S.C. section 360bbb-3(b)(1), unless the authorization is terminated or revoked.  Performed at [facility], [address] [place], [place] [address]     Coagulation Studies: No results for input(s): LABPROT, INR in the last 72 hours.  Urinalysis: Recent Labs    04/25/20 1836  COLORURINE YELLOW*  LABSPEC 1.016  PHURINE 5.0  GLUCOSEU NEGATIVE  HGBUR MODERATE*  BILIRUBINUR NEGATIVE  KETONESUR 20*  PROTEINUR 100*  NITRITE NEGATIVE  LEUKOCYTESUR SMALL*      Imaging: CT Head Wo Contrast  Result Date: 04/25/2020 CLINICAL DATA:  Near syncope. EXAM: CT HEAD WITHOUT CONTRAST TECHNIQUE: Contiguous axial images were obtained from the base of the skull through the vertex without intravenous contrast. COMPARISON:  None. FINDINGS: Brain: Mild chronic ischemic white matter disease is noted. No mass effect or midline shift is noted. Ventricular size is within normal limits. There is no evidence of mass lesion, hemorrhage or acute infarction. Vascular: No hyperdense vessel or unexpected calcification. Skull: Normal. Negative for fracture or focal lesion. Sinuses/Orbits: No acute finding. Other:  None. IMPRESSION: Mild chronic ischemic white matter disease. No acute intracranial abnormality seen. Electronically Signed   By: Marijo Conception M.D.   On: 04/25/2020 18:43   CT CHEST W CONTRAST  Result Date: 04/26/2020 CLINICAL DATA:  74 year old female presenting with hyponatremia and unintended weight loss. EXAM: CT CHEST WITH CONTRAST TECHNIQUE: Multidetector CT imaging of the chest was performed during intravenous contrast administration. CONTRAST:  18mL OMNIPAQUE IOHEXOL 300 MG/ML  SOLN COMPARISON:  07/11/2019, 06/09/2016 FINDINGS: Cardiovascular: Heart is normal in size. No pericardial effusion. Severe coronary atherosclerotic calcifications. Coarse atherosclerotic calcifications in the aortic arch descending thoracic and proximal abdominal aorta. Patent central veins. Mediastinum/Nodes: No enlarged mediastinal, hilar, or axillary lymph nodes. 1.5 cm hypoattenuating left upper thyroid nodule. The trachea and esophagus demonstrate no significant findings. Lungs/Pleura: Unchanged bilateral apical scarring. Similar appearing upper lobe predominant moderate centrilobular emphysema. Similar appearing posterior left upper lobe ground-glass nodule measuring up to 1.6 cm. Unchanged lingular solid pulmonary nodule measuring up to 0.5 cm. No new suspicious pulmonary nodules. No consolidative opacity, pleural effusion, or pneumothorax. Upper Abdomen: The visualized upper abdomen is within normal limits. Musculoskeletal: Similar appearing sigmoid curvature of the thoracic spine. Osteopenia. No acute osseous abnormality. No aggressive appearing osseous lesion. IMPRESSION: 1. No acute intrathoracic abnormality. 2. Ground-glass solid pulmonary nodule in the posterior left upper lobe measuring up to 1.6  cm, slightly increased in conspicuity since 2018 but overall unchanged in size. Repeat CT is recommended every 2 years until 5 years of stability has been established. This recommendation follows the consensus  statement: Guidelines for Management of Incidental Pulmonary Nodules Detected on CT Images: From the Fleischner Society 2017; Radiology 2017; 284:228-243. 3. Indeterminate left superior thyroid nodule measuring up to 1.5 cm. Recommend dedicated thyroid ultrasound for further characterization. 4. Aortic Atherosclerosis (ICD10-I70.0) and Emphysema (ICD10-J43.9). Electronically Signed   By: Ruthann Cancer MD   On: 04/26/2020 10:25      Assessment & Plan: Ms. KELCEY KORUS is a 74 y.o. white female with hypertension, coronary artery disease, hyperlipidemia, tobacco use, who was admitted to [facility] on 04/25/2020 for Hyponatremia [E87.1] Syncope [R55] Syncope, unspecified syncope type [R55]  1. Hyponatremia: acute on chronic. Serum sodium of 134 on 01/16/20.  Not currently on diuretics.  - Given IV fluids - discontinue now - Start Tolvaptan  2. Syncopal episode: secondary to hyponatremia? Continue work up.    LOS: 0 Jandy Brackens 12/11/202112:41 PM

## 2020-04-26 NOTE — Progress Notes (Signed)
MEDICATION RELATED CONSULT NOTE - FOLLOW UP   Pharmacy Consult for Tolvaptan Indication: Hyponatremia  Allergies  Allergen Reactions  . Sulfa Antibiotics Hives    Patient Measurements: Height: 5\' 7"  (170.2 cm) Weight: 64 kg (141 lb 1.5 oz) IBW/kg (Calculated) : 61.6  Vital Signs: Temp: 98.1 F (36.7 C) (12/11 1143) Temp Source: Oral (12/11 2841) BP: 154/69 (12/11 1143) Pulse Rate: 60 (12/11 1143) Intake/Output from previous day: 12/10 0701 - 12/11 0700 In: 651.3 [I.V.:651.3] Out: 100 [Urine:100] Intake/Output from this shift: No intake/output data recorded.  Labs: Recent Labs    04/25/20 1515 04/26/20 0437  WBC 4.6  --   HGB 12.3  --   HCT 35.6*  --   PLT 169  --   CREATININE 0.81 0.61  MG 1.9  --    Estimated Creatinine Clearance: 61.8 mL/min (by C-G formula based on SCr of 0.61 mg/dL).   Assessment: 74 yo female with hyponatremia (Na 123) to start on tolvaptan.  12/11 tolvaptan 15 mg PO x1 ordered  Goal of Therapy:  Desired rate of correction: should not exceed 8 mEq/L per 8 hours or 12 mEq/L in 24 hours.   Plan:  Monitor Na level q8h x24h then daily - Hold and notify MD if Na increases more than 8 mEq/L in 8h or greater than 12 mEq/L in 24h.   Rocky Morel 04/26/2020,1:02 PM

## 2020-04-27 LAB — T4, FREE: Free T4: 1.2 ng/dL — ABNORMAL HIGH (ref 0.61–1.12)

## 2020-04-27 LAB — BASIC METABOLIC PANEL
Anion gap: 7 (ref 5–15)
BUN: 9 mg/dL (ref 8–23)
CO2: 24 mmol/L (ref 22–32)
Calcium: 8.6 mg/dL — ABNORMAL LOW (ref 8.9–10.3)
Chloride: 99 mmol/L (ref 98–111)
Creatinine, Ser: 0.61 mg/dL (ref 0.44–1.00)
GFR, Estimated: >60 mL/min (ref >60)
Glucose, Bld: 92 mg/dL (ref 70–99)
Potassium: 3.7 mmol/L (ref 3.5–5.1)
Sodium: 130 mmol/L — ABNORMAL LOW (ref 135–145)

## 2020-04-27 LAB — GLUCOSE, CAPILLARY: Glucose-Capillary: 75 mg/dL (ref 70–99)

## 2020-04-27 LAB — TSH: TSH: 2.763 u[IU]/mL (ref 0.350–4.500)

## 2020-04-27 MED ORDER — SODIUM CHLORIDE 1 G PO TABS
1.0000 g | ORAL_TABLET | Freq: Two times a day (BID) | ORAL | Status: DC
  Administered 2020-04-27: 11:00:00 1 g via ORAL
  Filled 2020-04-27 (×2): qty 1

## 2020-04-27 MED ORDER — SODIUM CHLORIDE 1 G PO TABS
1.0000 g | ORAL_TABLET | Freq: Two times a day (BID) | ORAL | 0 refills | Status: DC
Start: 2020-04-27 — End: 2022-10-07

## 2020-04-27 MED ORDER — EZETIMIBE 10 MG PO TABS: 10.0000 mg | ORAL_TABLET | Freq: Every day | ORAL | Status: DC

## 2020-04-27 MED ORDER — NICOTINE 14 MG/24HR TD PT24: MEDICATED_PATCH | TRANSDERMAL | 0 refills | Status: DC

## 2020-04-27 NOTE — Discharge Instructions (Signed)
Fluid restrict to 32 ounces per day.  Get rid of soda. Will also need thyroid ultrasound as outpatient. If you start feeling symptoms like your going to pass out then try to lie down as quickly as you can.   Syncope Syncope is when you pass out (faint) for a short time. It is caused by a sudden decrease in blood flow to the brain. Signs that you may be about to pass out include:  Feeling dizzy or light-headed.  Feeling sick to your stomach (nauseous).  Seeing all white or all black.  Having cold, clammy skin. If you pass out, get help right away. Call your local emergency services (911 in the [place] Do not drive yourself to the hospital. Follow these instructions at home: Watch for any changes in your symptoms. Take these actions to stay safe and help with your symptoms: Lifestyle  Do not drive, use machinery, or play sports until your doctor says it is okay.  Do not drink alcohol.  Do not use any products that contain nicotine or tobacco, such as cigarettes and e-cigarettes. If you need help quitting, ask your doctor.  Drink enough fluid to keep your pee (urine) pale yellow. General instructions  Take over-the-counter and prescription medicines only as told by your doctor.  If you are taking blood pressure or heart medicine, sit up and stand up slowly. Spend a few minutes getting ready to sit and then stand. This can help you feel less dizzy.  Have someone stay with you until you feel stable.  If you start to feel like you might pass out, lie down right away and raise (elevate) your feet above the level of your heart. Breathe deeply and steadily. Wait until all of the symptoms are gone.  Keep all follow-up visits as told by your doctor. This is important. Get help right away if:  You have a very bad headache.  You pass out once or more than once.  You have pain in your chest, belly, or back.  You have a very fast or uneven heartbeat (palpitations).  It hurts to  breathe.  You are bleeding from your mouth or your bottom (rectum).  You have black or tarry poop (stool).  You have jerky movements that you cannot control (seizure).  You are confused.  You have trouble walking.  You are very weak.  You have vision problems. These symptoms may be an emergency. Do not wait to see if the symptoms will go away. Get medical help right away. Call your local emergency services (911 in the [place] Do not drive yourself to the hospital. Summary  Syncope is when you pass out (faint) for a short time. It is caused by a sudden decrease in blood flow to the brain.  Signs that you may be about to faint include feeling dizzy, light-headed, or sick to your stomach, seeing all white or all black, or having cold, clammy skin.  If you start to feel like you might pass out, lie down right away and raise (elevate) your feet above the level of your heart. Breathe deeply and steadily. Wait until all of the symptoms are gone. This information is not intended to replace advice given to you by your health care provider. Make sure you discuss any questions you have with your health care provider. Document Revised: 06/15/2017 Document Reviewed: 06/15/2017 Elsevier Patient Education  [place].  Hyponatremia Hyponatremia is when the amount of salt (sodium) in your blood is too low. When salt  levels are low, your body may take in extra water. This can cause swelling throughout the body. The swelling often affects the brain. What are the causes? This condition may be caused by:  Certain medical problems or conditions.  Vomiting a lot.  Having watery poop (diarrhea) often.  Certain medicines or illegal drugs.  Not having enough water in the body (dehydration).  Drinking too much water.  Eating a diet that is low in salt.  Large burns on your body.  Too much sweating. What increases the risk? You are more likely to get this condition if you:  Have  long-term (chronic) kidney disease.  Have heart failure.  Have a medical condition that causes you to have watery poop often.  Do very hard exercises.  Take medicines that affect the amount of salt is in your blood. What are the signs or symptoms? Symptoms of this condition include:  Headache.  Feeling like you may vomit (nausea).  Vomiting.  Being very tired (lethargic).  Muscle weakness and cramps.  Not wanting to eat as much as normal (loss of appetite).  Feeling weak or light-headed. Severe symptoms of this condition include:  Confusion.  Feeling restless (agitation).  Having a fast heart rate.  Passing out (fainting).  Seizures.  Coma. How is this treated? Treatment for this condition depends on the cause. Treatment may include:  Getting fluids through an IV tube that is put into one of your veins.  Taking medicines to fix the salt levels in your blood. If medicines are causing the problem, your medicines will need to be changed.  Limiting how much water or fluid you take in.  Monitoring in the hospital to watch your symptoms. Follow these instructions at home:   Take over-the-counter and prescription medicines only as told by your doctor. Many medicines can make this condition worse. Talk with your doctor about any medicines that you are taking.  Eat and drink exactly as you are told by your doctor. ? Eat only the foods you are told to eat. ? Limit how much fluid you take.  Do not drink alcohol.  Keep all follow-up visits as told by your doctor. This is important. Contact a doctor if:  You feel more like you may vomit.  You feel more tired.  Your headache gets worse.  You feel more confused.  You feel weaker.  Your symptoms go away and then they come back.  You have trouble following the diet instructions. Get help right away if:  You have a seizure.  You pass out.  You keep having watery poop.  You keep  vomiting. Summary  Hyponatremia is when the amount of salt in your blood is too low.  When salt levels are low, you can have swelling throughout the body. The swelling mostly affects the brain.  Treatment depends on the cause. Treatment may include getting IV fluids, medicines, or not drinking as much fluid. This information is not intended to replace advice given to you by your health care provider. Make sure you discuss any questions you have with your health care provider. Document Revised: 07/20/2018 Document Reviewed: 04/06/2018 Elsevier Patient Education  [place].

## 2020-04-27 NOTE — Progress Notes (Signed)
MEDICATION RELATED CONSULT NOTE - FOLLOW UP   Pharmacy Consult for Tolvaptan Indication: Hyponatremia  Allergies  Allergen Reactions  . Sulfa Antibiotics Hives    Patient Measurements: Height: 5\' 7"  (170.2 cm) Weight: 63.5 kg (139 lb 15.9 oz) IBW/kg (Calculated) : 61.6  Vital Signs: Temp: 98 F (36.7 C) (12/12 0425) Temp Source: Oral (12/12 0425) BP: 128/77 (12/12 0425) Pulse Rate: 69 (12/12 0425) Intake/Output from previous day: 12/11 0701 - 12/12 0700 In: 0  Out: 2200 [Urine:2200] Intake/Output from this shift: Total I/O In: -  Out: 2200 [Urine:2200]  Labs: Recent Labs    04/25/20 1515 04/26/20 0437 04/26/20 1200 04/27/20 0445  WBC 4.6  --   --   --   HGB 12.3  --   --   --   HCT 35.6*  --   --   --   PLT 169  --   --   --   CREATININE 0.81 0.61  --  0.61  MG 1.9  --   --   --   ALBUMIN  --   --  3.2*  --   PROT  --   --  6.7  --   AST  --   --  30  --   ALT  --   --  16  --   ALKPHOS  --   --  72  --   BILITOT  --   --  0.9  --   BILIDIR  --   --  0.1  --   IBILI  --   --  0.8  --    Estimated Creatinine Clearance: 61.8 mL/min (by C-G formula based on SCr of 0.61 mg/dL).   Assessment: 74 yo female with hyponatremia (Na 123) to start on tolvaptan.  12/11 tolvaptan 15 mg PO x1 @1624   Na 122 >> 123 >> 128 >> 130; increased by by 2 mmol (7 mmol total)   Goal of Therapy:  Desired rate of correction: should not exceed 8 mEq/L per 8 hours or 12 mEq/L in 24 hours.   Plan:  Monitor Na level q8h x24h then daily - Hold and notify MD if Na increases more than 8 mEq/L in 8h or greater than 12 mEq/L in 24h.   Renda Rolls, PharmD, [facility] 04/27/2020 6:35 AM

## 2020-04-27 NOTE — Progress Notes (Signed)
[facility]  ROUNDING NOTE   Subjective:   Na 130 - status post tolvaptan 15mg  yesterday PO.   Objective:  Vital signs in last 24 hours:  Temp:  [97.9 F (36.6 C)-98.8 F (37.1 C)] 98.2 F (36.8 C) (12/12 0757) Pulse Rate:  [59-71] 65 (12/12 0757) Resp:  [16-18] 18 (12/12 0425) BP: (128-164)/(68-77) 139/69 (12/12 0757) SpO2:  [98 %-100 %] 98 % (12/12 0425) Weight:  [63.5 kg] 63.5 kg (12/12 0425)  Weight change: -0.457 kg Filed Weights   04/25/20 2132 04/26/20 0414 04/27/20 0425  Weight: 64.9 kg 64 kg 63.5 kg    Intake/Output: I/O last 3 completed shifts: In: 651.3 [I.V.:651.3] Out: 2300 [Urine:2300]   Intake/Output this shift:  Total I/O In: 240 [P.O.:240] Out: 500 [Urine:500]  Physical Exam: General: NAD,   Head: Normocephalic, atraumatic. Moist oral mucosal membranes  Eyes: Anicteric, PERRL  Neck: Supple, trachea midline  Lungs:  Clear to auscultation  Heart: Regular rate and rhythm  Abdomen:  Soft, nontender,   Extremities:  no peripheral edema.  Neurologic: Nonfocal, moving all four extremities  Skin: No lesions        Basic Metabolic Panel: Recent Labs  Lab 04/25/20 1515 04/26/20 0437 04/26/20 1200 04/26/20 2051 04/27/20 0445  NA 122* 122* 123* 128* 130*  K 3.4* 3.7  --   --  3.7  CL 85* 92*  --   --  99  CO2 24 23  --   --  24  GLUCOSE 124* 88  --   --  92  BUN 19 15  --   --  9  CREATININE 0.81 0.61  --   --  0.61  CALCIUM 8.8* 8.2*  --   --  8.6*  MG 1.9  --   --   --   --     Liver Function Tests: Recent Labs  Lab 04/26/20 1200  AST 30  ALT 16  ALKPHOS 72  BILITOT 0.9  PROT 6.7  ALBUMIN 3.2*   No results for input(s): LIPASE, AMYLASE in the last 168 hours. No results for input(s): AMMONIA in the last 168 hours.  CBC: Recent Labs  Lab 04/25/20 1515  WBC 4.6  HGB 12.3  HCT 35.6*  MCV 93.4  PLT 169    Cardiac Enzymes: No results for input(s): CKTOTAL, CKMB, CKMBINDEX, TROPONINI in the last 168  hours.  BNP: Invalid input(s): POCBNP  CBG: Recent Labs  Lab 04/26/20 0414 04/27/20 0427  GLUCAP 80 75    Microbiology: Results for orders placed or performed during the hospital encounter of 04/25/20  Resp Panel by RT-PCR (Flu A&B, Covid) Nasopharyngeal Swab     Status: None   Collection Time: 04/25/20  6:36 PM   Specimen: Nasopharyngeal Swab; Nasopharyngeal(NP) swabs in vial transport medium  Result Value Ref Range Status   SARS Coronavirus 2 by RT PCR NEGATIVE NEGATIVE Final    Comment: (NOTE) SARS-CoV-2 target nucleic acids are NOT DETECTED.  The SARS-CoV-2 RNA is generally detectable in upper respiratory specimens during the acute phase of infection. The lowest concentration of SARS-CoV-2 viral copies this assay can detect is 138 copies/mL. A negative result does not preclude SARS-Cov-2 infection and should not be used as the sole basis for treatment or other patient management decisions. A negative result may occur with  improper specimen collection/handling, submission of specimen other than nasopharyngeal swab, presence of viral mutation(s) within the areas targeted by this assay, and inadequate number of viral copies(<138 copies/mL). A negative  result must be combined with clinical observations, patient history, and epidemiological information. The expected result is Negative.  Fact Sheet for Patients:  EntrepreneurPulse.com.au  Fact Sheet for Healthcare Providers:  IncredibleEmployment.be  This test is no t yet approved or cleared by the [place] FDA and  has been authorized for detection and/or diagnosis of SARS-CoV-2 by FDA under an Emergency Use Authorization (EUA). This EUA will remain  in effect (meaning this test can be used) for the duration of the COVID-19 declaration under Section 564(b)(1) of the Act, 21 U.S.C.section 360bbb-3(b)(1), unless the authorization is terminated  or revoked sooner.        Influenza A by PCR NEGATIVE NEGATIVE Final   Influenza B by PCR NEGATIVE NEGATIVE Final    Comment: (NOTE) The Xpert Xpress SARS-CoV-2/FLU/RSV plus assay is intended as an aid in the diagnosis of influenza from Nasopharyngeal swab specimens and should not be used as a sole basis for treatment. Nasal washings and aspirates are unacceptable for Xpert Xpress SARS-CoV-2/FLU/RSV testing.  Fact Sheet for Patients: EntrepreneurPulse.com.au  Fact Sheet for Healthcare Providers: IncredibleEmployment.be  This test is not yet approved or cleared by the [place] FDA and has been authorized for detection and/or diagnosis of SARS-CoV-2 by FDA under an Emergency Use Authorization (EUA). This EUA will remain in effect (meaning this test can be used) for the duration of the COVID-19 declaration under Section 564(b)(1) of the Act, 21 U.S.C. section 360bbb-3(b)(1), unless the authorization is terminated or revoked.  Performed at [facility], [address] [place], [place] [address]     Coagulation Studies: No results for input(s): LABPROT, INR in the last 72 hours.  Urinalysis: Recent Labs    04/25/20 1836  COLORURINE YELLOW*  LABSPEC 1.016  PHURINE 5.0  GLUCOSEU NEGATIVE  HGBUR MODERATE*  BILIRUBINUR NEGATIVE  KETONESUR 20*  PROTEINUR 100*  NITRITE NEGATIVE  LEUKOCYTESUR SMALL*      Imaging: CT Head Wo Contrast  Result Date: 04/25/2020 CLINICAL DATA:  Near syncope. EXAM: CT HEAD WITHOUT CONTRAST TECHNIQUE: Contiguous axial images were obtained from the base of the skull through the vertex without intravenous contrast. COMPARISON:  None. FINDINGS: Brain: Mild chronic ischemic white matter disease is noted. No mass effect or midline shift is noted. Ventricular size is within normal limits. There is no evidence of mass lesion, hemorrhage or acute infarction. Vascular: No hyperdense vessel or unexpected calcification. Skull: Normal.  Negative for fracture or focal lesion. Sinuses/Orbits: No acute finding. Other: None. IMPRESSION: Mild chronic ischemic white matter disease. No acute intracranial abnormality seen. Electronically Signed   By: Marijo Conception M.D.   On: 04/25/2020 18:43   CT CHEST W CONTRAST  Result Date: 04/26/2020 CLINICAL DATA:  74 year old female presenting with hyponatremia and unintended weight loss. EXAM: CT CHEST WITH CONTRAST TECHNIQUE: Multidetector CT imaging of the chest was performed during intravenous contrast administration. CONTRAST:  51mL OMNIPAQUE IOHEXOL 300 MG/ML  SOLN COMPARISON:  07/11/2019, 06/09/2016 FINDINGS: Cardiovascular: Heart is normal in size. No pericardial effusion. Severe coronary atherosclerotic calcifications. Coarse atherosclerotic calcifications in the aortic arch descending thoracic and proximal abdominal aorta. Patent central veins. Mediastinum/Nodes: No enlarged mediastinal, hilar, or axillary lymph nodes. 1.5 cm hypoattenuating left upper thyroid nodule. The trachea and esophagus demonstrate no significant findings. Lungs/Pleura: Unchanged bilateral apical scarring. Similar appearing upper lobe predominant moderate centrilobular emphysema. Similar appearing posterior left upper lobe ground-glass nodule measuring up to 1.6 cm. Unchanged lingular solid pulmonary nodule measuring up to 0.5 cm. No new suspicious pulmonary  nodules. No consolidative opacity, pleural effusion, or pneumothorax. Upper Abdomen: The visualized upper abdomen is within normal limits. Musculoskeletal: Similar appearing sigmoid curvature of the thoracic spine. Osteopenia. No acute osseous abnormality. No aggressive appearing osseous lesion. IMPRESSION: 1. No acute intrathoracic abnormality. 2. Ground-glass solid pulmonary nodule in the posterior left upper lobe measuring up to 1.6 cm, slightly increased in conspicuity since 2018 but overall unchanged in size. Repeat CT is recommended every 2 years until 5 years of  stability has been established. This recommendation follows the consensus statement: Guidelines for Management of Incidental Pulmonary Nodules Detected on CT Images: From the Fleischner Society 2017; Radiology 2017; 284:228-243. 3. Indeterminate left superior thyroid nodule measuring up to 1.5 cm. Recommend dedicated thyroid ultrasound for further characterization. 4. Aortic Atherosclerosis (ICD10-I70.0) and Emphysema (ICD10-J43.9). Electronically Signed   By: Ruthann Cancer MD   On: 04/26/2020 10:25     Medications:    . aspirin EC  81 mg Oral Daily  . atorvastatin  80 mg Oral QHS  . enoxaparin (LOVENOX) injection  40 mg Subcutaneous Q24H  . ezetimibe  10 mg Oral QHS  . nicotine  14 mg Transdermal Daily  . sodium chloride flush  3 mL Intravenous Q12H  . sodium chloride  1 g Oral BID WC   acetaminophen **OR** acetaminophen, ondansetron **OR** ondansetron (ZOFRAN) IV  Assessment/ Plan:  Julie Beck is a 74 y.o. white female with hypertension, coronary artery disease, hyperlipidemia, tobacco use, who was admitted to [facility] on 04/25/2020 for Hyponatremia [E87.1] and  Syncope [R55]  1. Hyponatremia: acute on chronic. Serum sodium of 134 on 01/16/20.  Not currently on diuretics.  Status post tolvaptan on 12/11.    2. Syncopal episode: secondary to hyponatremia? Continue work up as outpatient.     LOS: 1 Julie Beck 12/12/202112:43 PM

## 2020-04-27 NOTE — Discharge Summary (Signed)
[place] [facility] [place] NAME: Julie Beck    MR#:  [id number]  DATE OF BIRTH:  1945-12-21  DATE OF ADMISSION:  04/25/2020 ADMITTING PHYSICIAN: Loletha Grayer, MD  DATE OF DISCHARGE: 04/27/2020 12:09 PM  PRIMARY CARE PHYSICIAN: Towanda Malkin, MD    ADMISSION DIAGNOSIS:  Hyponatremia [E87.1] Syncope [R55] Syncope, unspecified syncope type [R55]  DISCHARGE DIAGNOSIS:  Principal Problem:   Syncope Active Problems:   Tobacco abuse   Hyperlipidemia   Coronary artery disease   Hyponatremia   SIADH (syndrome of inappropriate ADH production) (HCC)   Pulmonary nodule   Thyroid nodule   SECONDARY DIAGNOSIS:   Past Medical History:  Diagnosis Date  . Abnormal CT of liver 07/12/2016   Chest CT Feb 2018  . Arthritis   . Centrilobular emphysema ([place]) 07/12/2016   Chest CT Feb 2018  . Coronary artery disease 07/12/2016   Followed by Dr. Saunders Revel, seen on chest CT Feb 2018  . Epigastric abdominal pain 03/17/2016  . Hyperlipidemia   . Overweight (BMI 25.0-29.9) 03/17/2016  . Thoracic aortic atherosclerosis ([place]) 07/12/2016   Chest CT scan Feb 2018  . Tobacco abuse 03/17/2016    HOSPITAL COURSE:   1.  Severe hyponatremia.  The patient presented to the hospital and had a sodium of 122.  Patient was given IV fluids overnight and we gave another fluid bolus and sodium only improved to 123.  We did give the patient a dose of tolvaptan.  Urine osmolarity and urine sodium levels were high which was consistent with SIADH.  Sodium improved to 130 upon disposition.  CT scan of the chest did show a pulmonary nodule which will have to be followed as outpatient.  Case discussed with nephrology and we will give salt tablets twice daily dosing and follow-up as outpatient for continued SIADH work-up.  Patient was advised to get rid of drinking soda.  Fluid restriction to 32 ounces per day. 2.  Syncope x2.  Patient was monitored on telemetry no arrhythmias  were seen.  Patient felt well after IV fluid hydration.  Patient was advised to drink hydrating beverages and not drink soda.  Since the patient did have a prodrome she was advised to lie down as quickly as possible if she does get the prodrome back again.  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  Could have contributed to the syncopal episode. 3.  Pulmonary nodule.  CT scan did show a nodule and it was similar size as it was 2 years ago.  Can consider a PET CT scan as outpatient. 4.  Thyroid nodule.  TSH and free T4 were in the normal range.  Can consider outpatient thyroid ultrasound and possibly a biopsy. 5.  Hyperlipidemia unspecified on atorvastatin and Zetia 6.  Tobacco abuse continue nicotine patch   DISCHARGE CONDITIONS:   Satisfactory  CONSULTS OBTAINED:  Treatment Team:  Lavonia Dana, MD  DRUG ALLERGIES:   Allergies  Allergen Reactions  . Sulfa Antibiotics Hives    DISCHARGE MEDICATIONS:   Allergies as of 04/27/2020      Reactions   Sulfa Antibiotics Hives      Medication List    TAKE these medications   atorvastatin 80 MG tablet Commonly known as: LIPITOR TAKE 1 TABLET(80 MG) BY MOUTH AT BEDTIME What changed: See the new instructions.   ezetimibe 10 MG tablet Commonly known as: ZETIA Take 1 tablet (10 mg total) by mouth at bedtime.  What changed: See the new instructions.   multivitamin tablet Take 1 tablet by mouth daily.   nicotine 14 mg/24hr patch Commonly known as: NICODERM CQ - dosed in mg/24 hours One 14mg  patch chest wall daily (okay to substitute generic)   sodium chloride 1 g tablet Take 1 tablet (1 g total) by mouth 2 (two) times daily with a meal.        DISCHARGE INSTRUCTIONS:   Follow-up PCP 5 days Follow-up Dr. Rolly Salter nephrology as outpatient this week  If you experience worsening of your admission symptoms, develop shortness of breath, life threatening emergency, suicidal or homicidal  thoughts you must seek medical attention immediately by calling 911 or calling your MD immediately  if symptoms less severe.  You Must read complete instructions/literature along with all the possible adverse reactions/side effects for all the Medicines you take and that have been prescribed to you. Take any new Medicines after you have completely understood and accept all the possible adverse reactions/side effects.   Please note  You were cared for by a hospitalist during your hospital stay. If you have any questions about your discharge medications or the care you received while you were in the hospital after you are discharged, you can call the unit and asked to speak with the hospitalist on call if the hospitalist that took care of you is not available. Once you are discharged, your primary care physician will handle any further medical issues. Please note that NO REFILLS for any discharge medications will be authorized once you are discharged, as it is imperative that you return to your primary care physician (or establish a relationship with a primary care physician if you do not have one) for your aftercare needs so that they can reassess your need for medications and monitor your lab values.    Today   CHIEF COMPLAINT:   Chief Complaint  Patient presents with  . Near Syncope    HISTORY OF PRESENT ILLNESS:  Julie Beck  is a 74 y.o. female came in with 2 syncopal episode   VITAL SIGNS:  Blood pressure 139/69, pulse 65, temperature 98.2 F (36.8 C), temperature source Oral, resp. rate 18, height 5\' 7"  (1.702 m), weight 63.5 kg, SpO2 98 %.  I/O:    Intake/Output Summary (Last 24 hours) at 04/27/2020 1522 Last data filed at 04/27/2020 0935 Gross per 24 hour  Intake 240 ml  Output 2700 ml  Net -2460 ml    PHYSICAL EXAMINATION:  GENERAL:  74 y.o.-year-old patient lying in the bed with no acute distress.  EYES: Pupils equal, round, reactive to light and accommodation. No  scleral icterus. Extraocular muscles intact.  HEENT: Head atraumatic, normocephalic. Oropharynx and nasopharynx clear.   LUNGS: Normal breath sounds bilaterally, no wheezing, rales,rhonchi or crepitation. No use of accessory muscles of respiration.  CARDIOVASCULAR: S1, S2 normal. No murmurs, rubs, or gallops.  ABDOMEN: Soft, non-tender, non-distended.  EXTREMITIES: No pedal edema.  NEUROLOGIC: Cranial nerves II through XII are intact. Muscle strength 5/5 in all extremities. Sensation intact. Gait not checked.  PSYCHIATRIC: The patient is alert and oriented x 3.  SKIN: No obvious rash, lesion, or ulcer.   DATA REVIEW:   CBC Recent Labs  Lab 04/25/20 1515  WBC 4.6  HGB 12.3  HCT 35.6*  PLT 169    Chemistries  Recent Labs  Lab 04/25/20 1515 04/26/20 0437 04/26/20 1200 04/26/20 2051 04/27/20 0445  NA 122*   < > 123*   < > 130*  K 3.4*   < >  --   --  3.7  CL 85*   < >  --   --  99  CO2 24   < >  --   --  24  GLUCOSE 124*   < >  --   --  92  BUN 19   < >  --   --  9  CREATININE 0.81   < >  --   --  0.61  CALCIUM 8.8*   < >  --   --  8.6*  MG 1.9  --   --   --   --   AST  --   --  30  --   --   ALT  --   --  16  --   --   ALKPHOS  --   --  72  --   --   BILITOT  --   --  0.9  --   --    < > = values in this interval not displayed.    Microbiology Results  Results for orders placed or performed during the hospital encounter of 04/25/20  Resp Panel by RT-PCR (Flu A&B, Covid) Nasopharyngeal Swab     Status: None   Collection Time: 04/25/20  6:36 PM   Specimen: Nasopharyngeal Swab; Nasopharyngeal(NP) swabs in vial transport medium  Result Value Ref Range Status   SARS Coronavirus 2 by RT PCR NEGATIVE NEGATIVE Final    Comment: (NOTE) SARS-CoV-2 target nucleic acids are NOT DETECTED.  The SARS-CoV-2 RNA is generally detectable in upper respiratory specimens during the acute phase of infection. The lowest concentration of SARS-CoV-2 viral copies this assay can detect  is 138 copies/mL. A negative result does not preclude SARS-Cov-2 infection and should not be used as the sole basis for treatment or other patient management decisions. A negative result may occur with  improper specimen collection/handling, submission of specimen other than nasopharyngeal swab, presence of viral mutation(s) within the areas targeted by this assay, and inadequate number of viral copies(<138 copies/mL). A negative result must be combined with clinical observations, patient history, and epidemiological information. The expected result is Negative.  Fact Sheet for Patients:  EntrepreneurPulse.com.au  Fact Sheet for Healthcare Providers:  IncredibleEmployment.be  This test is no t yet approved or cleared by the [place] FDA and  has been authorized for detection and/or diagnosis of SARS-CoV-2 by FDA under an Emergency Use Authorization (EUA). This EUA will remain  in effect (meaning this test can be used) for the duration of the COVID-19 declaration under Section 564(b)(1) of the Act, 21 U.S.C.section 360bbb-3(b)(1), unless the authorization is terminated  or revoked sooner.       Influenza A by PCR NEGATIVE NEGATIVE Final   Influenza B by PCR NEGATIVE NEGATIVE Final    Comment: (NOTE) The Xpert Xpress SARS-CoV-2/FLU/RSV plus assay is intended as an aid in the diagnosis of influenza from Nasopharyngeal swab specimens and should not be used as a sole basis for treatment. Nasal washings and aspirates are unacceptable for Xpert Xpress SARS-CoV-2/FLU/RSV testing.  Fact Sheet for Patients: EntrepreneurPulse.com.au  Fact Sheet for Healthcare Providers: IncredibleEmployment.be  This test is not yet approved or cleared by the [place] FDA and has been authorized for detection and/or diagnosis of SARS-CoV-2 by FDA under an Emergency Use Authorization (EUA). This EUA will remain in effect  (meaning this test can be used) for the duration of the COVID-19 declaration under Section 564(b)(1) of  the Act, 21 U.S.C. section 360bbb-3(b)(1), unless the authorization is terminated or revoked.  Performed at [facility], [address] [place], [place] [address]     RADIOLOGY:  CT Head Wo Contrast  Result Date: 04/25/2020 CLINICAL DATA:  Near syncope. EXAM: CT HEAD WITHOUT CONTRAST TECHNIQUE: Contiguous axial images were obtained from the base of the skull through the vertex without intravenous contrast. COMPARISON:  None. FINDINGS: Brain: Mild chronic ischemic white matter disease is noted. No mass effect or midline shift is noted. Ventricular size is within normal limits. There is no evidence of mass lesion, hemorrhage or acute infarction. Vascular: No hyperdense vessel or unexpected calcification. Skull: Normal. Negative for fracture or focal lesion. Sinuses/Orbits: No acute finding. Other: None. IMPRESSION: Mild chronic ischemic white matter disease. No acute intracranial abnormality seen. Electronically Signed   By: Marijo Conception M.D.   On: 04/25/2020 18:43   CT CHEST W CONTRAST  Result Date: 04/26/2020 CLINICAL DATA:  74 year old female presenting with hyponatremia and unintended weight loss. EXAM: CT CHEST WITH CONTRAST TECHNIQUE: Multidetector CT imaging of the chest was performed during intravenous contrast administration. CONTRAST:  80mL OMNIPAQUE IOHEXOL 300 MG/ML  SOLN COMPARISON:  07/11/2019, 06/09/2016 FINDINGS: Cardiovascular: Heart is normal in size. No pericardial effusion. Severe coronary atherosclerotic calcifications. Coarse atherosclerotic calcifications in the aortic arch descending thoracic and proximal abdominal aorta. Patent central veins. Mediastinum/Nodes: No enlarged mediastinal, hilar, or axillary lymph nodes. 1.5 cm hypoattenuating left upper thyroid nodule. The trachea and esophagus demonstrate no significant findings. Lungs/Pleura: Unchanged  bilateral apical scarring. Similar appearing upper lobe predominant moderate centrilobular emphysema. Similar appearing posterior left upper lobe ground-glass nodule measuring up to 1.6 cm. Unchanged lingular solid pulmonary nodule measuring up to 0.5 cm. No new suspicious pulmonary nodules. No consolidative opacity, pleural effusion, or pneumothorax. Upper Abdomen: The visualized upper abdomen is within normal limits. Musculoskeletal: Similar appearing sigmoid curvature of the thoracic spine. Osteopenia. No acute osseous abnormality. No aggressive appearing osseous lesion. IMPRESSION: 1. No acute intrathoracic abnormality. 2. Ground-glass solid pulmonary nodule in the posterior left upper lobe measuring up to 1.6 cm, slightly increased in conspicuity since 2018 but overall unchanged in size. Repeat CT is recommended every 2 years until 5 years of stability has been established. This recommendation follows the consensus statement: Guidelines for Management of Incidental Pulmonary Nodules Detected on CT Images: From the Fleischner Society 2017; Radiology 2017; 284:228-243. 3. Indeterminate left superior thyroid nodule measuring up to 1.5 cm. Recommend dedicated thyroid ultrasound for further characterization. 4. Aortic Atherosclerosis (ICD10-I70.0) and Emphysema (ICD10-J43.9). Electronically Signed   By: Ruthann Cancer MD   On: 04/26/2020 10:25    Management plans discussed with the patient, family and they are in agreement.  CODE STATUS:     Code Status Orders  (From admission, onward)         Start     Ordered   04/25/20 2027  Full code  Continuous        04/25/20 2029        Code Status History    This patient has a current code status but no historical code status.   Advance Care Planning Activity      TOTAL TIME TAKING CARE OF THIS PATIENT: 34 minutes.    Loletha Grayer M.D on 04/27/2020 at 3:22 PM  Between 7am to 6pm - Pager - [phone]  After 6pm go to [email or web address] -  password [email or web address]  [facility]  CC: Primary care physician; Towanda Malkin, MD

## 2020-04-28 ENCOUNTER — Telehealth: Payer: Self-pay

## 2020-04-28 NOTE — Telephone Encounter (Signed)
Transition Care Management Unsuccessful Follow-up Telephone Call  Date of discharge and from where:  04/27/20 [facility]  Attempts:  1st Attempt  Reason for unsuccessful TCM follow-up call:  Left voice message for patient reach me at [phone] for TCM call and to schedule a hospital follow up appt

## 2020-04-29 NOTE — Progress Notes (Addendum)
Patient ID: Julie Beck, female    DOB: 15-Feb-1946, 74 y.o.   MRN: [id number]  PCP: Towanda Malkin, MD  Chief Complaint  Patient presents with  . Hospitalization Follow-up    Subjective:   Julie Beck is a 74 y.o. female, presents to clinic with CC of the following:  Chief Complaint  Patient presents with  . Hospitalization Follow-up    HPI:  Patient is a 74 year old female Last visit with me was 01/16/2020 Follows up today for a post hospital follow-up Here with her son, (who had a [place] hat on) Her discharge summary was as follows:  DATE OF ADMISSION:  04/25/2020  ADMITTING PHYSICIAN: Loletha Grayer, MD  DATE OF DISCHARGE: 04/27/2020 12:09 PM  ADMISSION DIAGNOSIS:  Hyponatremia [E87.1] Syncope [R55] Syncope, unspecified syncope type [R55]  DISCHARGE DIAGNOSIS:  Principal Problem:   Syncope Active Problems:   Tobacco abuse   Hyperlipidemia   Coronary artery disease   Hyponatremia   SIADH (syndrome of inappropriate ADH production) ([place])   Pulmonary nodule   Thyroid nodule   SECONDARY DIAGNOSIS:       Past Medical History:  Diagnosis Date  . Abnormal CT of liver 07/12/2016   Chest CT Feb 2018  . Arthritis   . Centrilobular emphysema ([place]) 07/12/2016   Chest CT Feb 2018  . Coronary artery disease 07/12/2016   Followed by Dr. Saunders Revel, seen on chest CT Feb 2018  . Epigastric abdominal pain 03/17/2016  . Hyperlipidemia   . Overweight (BMI 25.0-29.9) 03/17/2016  . Thoracic aortic atherosclerosis ([place]) 07/12/2016   Chest CT scan Feb 2018  . Tobacco abuse 03/17/2016    HOSPITAL COURSE:   1.  Severe hyponatremia.  The patient presented to the hospital and had a sodium of 122.  Patient was given IV fluids overnight and we gave another fluid bolus and sodium only improved to 123.  We did give the patient a dose of tolvaptan.  Urine osmolarity and urine sodium levels were high which was consistent with SIADH.  Sodium improved to 130  upon disposition.  CT scan of the chest did show a pulmonary nodule which will have to be followed as outpatient.  Case discussed with nephrology and we will give salt tablets twice daily dosing and follow-up as outpatient for continued SIADH work-up.  Patient was advised to get rid of drinking soda.  Fluid restriction to 32 ounces per day. 2.  Syncope x2.  Patient was monitored on telemetry no arrhythmias were seen.  Patient felt well after IV fluid hydration.  Patient was advised to drink hydrating beverages and not drink soda.  Since the patient did have a prodrome she was advised to lie down as quickly as possible if she does get the prodrome back again.  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  Could have contributed to the syncopal episode. 3.  Pulmonary nodule.  CT scan did show a nodule and it was similar size as it was 2 years ago.  Can consider a PET CT scan as outpatient. 4.  Thyroid nodule.  TSH and free T4 were in the normal range.  Can consider outpatient thyroid ultrasound and possibly a biopsy. 5.  Hyperlipidemia unspecified on atorvastatin and Zetia 6.  Tobacco abuse continue nicotine patch   DISCHARGE CONDITIONS:   Satisfactory  CONSULTS OBTAINED:  Treatment Team:  Lavonia Dana, MD  DRUG ALLERGIES:       Allergies  Allergen Reactions  .  Sulfa Antibiotics Hives    DISCHARGE MEDICATIONS:        Allergies as of 04/27/2020      Reactions   Sulfa Antibiotics Hives         Medication List    TAKE these medications   atorvastatin 80 MG tablet Commonly known as: LIPITOR TAKE 1 TABLET(80 MG) BY MOUTH AT BEDTIME What changed: See the new instructions.   ezetimibe 10 MG tablet Commonly known as: ZETIA Take 1 tablet (10 mg total) by mouth at bedtime. What changed: See the new instructions.   multivitamin tablet Take 1 tablet by mouth daily.   nicotine 14 mg/24hr patch Commonly known as: NICODERM CQ - dosed  in mg/24 hours One 14mg  patch chest wall daily (okay to substitute generic)   sodium chloride 1 g tablet Take 1 tablet (1 g total) by mouth 2 (two) times daily with a meal.        DISCHARGE INSTRUCTIONS:   Follow-up PCP 5 days Follow-up Dr. Rolly Salter nephrology as outpatient this week   The patient noted having a follow-up with nephrology next Wednesday.  Impression noted from CAT scan 04/26/2020: IMPRESSION: 1. No acute intrathoracic abnormality. 2. Ground-glass solid pulmonary nodule in the posterior left upper lobe measuring up to 1.6 cm, slightly increased in conspicuity since 2018 but overall unchanged in size. Repeat CT is recommended every 2 years until 5 years of stability has been established. This recommendation follows the consensus statement: Guidelines for Management of Incidental Pulmonary Nodules Detected on CT Images: From the Fleischner Society 2017; Radiology 2017; 284:228-243. 3. Indeterminate left superior thyroid nodule measuring up to 1.5 cm. Recommend dedicated thyroid ultrasound for further characterization. 4. Aortic Atherosclerosis (ICD10-I70.0) and Emphysema (ICD10-J43.9).    From my prior visit notes and reviewed today:  Hyperlipidemia-on atorvastatin 80 mg daily and zetia 10 mg daily Taking medicines regularly Goal is LDL less than 70 Lab Results  Component Value Date   CHOL 127 01/16/2020   HDL 40 (L) 01/16/2020   LDLCALC 73 01/16/2020   TRIG 65 01/16/2020   CHOLHDL 3.2 01/16/2020    Coronary artery disease/Thoracic aortic atherosclerosis (HCC) (Chronic) Followed by cardiology in the past, although not in the recent past, as cardiology recommend follow-up as needed after last seen in November 2017. Denies recent chest pain, palpitations, shortness of breath, increased lower extremity swelling,   Tobacco use -current some day smoker, one pack lasts 2.5 days. When isolated with pandemic, seemed to go back to using.    She notes  she is wearing a nicotine patch now to try to help with quitting, and strongly encouraged.   Cancer screenings Colorectal Screening:Completed 11/28/17 with multiple polyps noted and a follow-up recommended in 1 year for surveillance after piecemeal polypectomy. pt aware to contact GI to follow up for repeat screening colonoscopy noted on annual Medicare evaluation 11/2018, although has not had a follow-up colonoscopy.  A referral was provided on the September visit to gastroenterology to help get this arranged.  Mammogram: Completed  04/21/2020 with right breast possible calcifications requiring further evaluation and left breast possible masses requiring further evaluation noted. A diagnostic mammogram and possibly ultrasounds of both breasts were recommended for follow-up, and await those results presently.  Bone Density:Completed 04/22/14. Results reflect OSTEOPENIA. Repeat every 2 years.Pt declined repeat screening on the annual Medicare evaluation 11/2018, declined on our last visit..   Lung Cancer Screening:  Last screening CT completed 07/11/2019 with the following impression: IMPRESSION: 1. Lung-RADS 2, benign appearance or  behavior. Continue annual screening with low-dose chest CT without contrast in 12 months. 2. Aortic atherosclerosis (ICD10-I70.0). Coronary artery calcification. 3. Emphysema (ICD10-J43.9).  Last women's health evaluation - many years ago, no recent sx's. Discussed evaluation in the near future.  She notes she still has some menopausal symptoms with some periodic hot flashes.    Since discharge, has slowly improved, balance is back to normal, feeling like back to prior self before hospitalization, not yet back to work, starts Friday and half days to start and slowly increase activities planned and and agreed with that being appropriate.  He is taking the salt tablets twice daily and also not drinking soda and trying to have fluid restriction to 32 ounces a  day.  Sometimes she does go over that a little.  She was placing a spoonful of sugar and her orange juice, and I could not provide a great reason why that should continue, and has a glass of orange juice as well as to half glasses of Gatorade daily.  She is also having decaffeinated coffee and not having caffeine. She had questions about increasing sodium in her diet, and noted does not need to add salt to her diet on top of what she is taking above.  When I noted the diagnosis of SIADH, she was not aware of that diagnosis, and noted she had not been told of that diagnosis when she left the hospital.  I spent a lot of time discussing this with her and her son today, with the obvious cause still unclear.  Emphasized the importance of following up with nephrology next week, who will be helpful and continuing with the work-up as an outpatient and trying to find the source of the SIADH.   Patient Active Problem List   Diagnosis Date Noted  . SIADH (syndrome of inappropriate ADH production) ([place])   . Pulmonary nodule   . Thyroid nodule   . Syncope 04/25/2020  . Hyponatremia 04/25/2020  . Osteopenia 01/16/2020  . Polyp of colon 01/16/2020  . Cirrhosis of liver ([place]) 07/20/2016  . Centrilobular emphysema ([place]) 07/12/2016  . Thoracic aortic atherosclerosis ([place]) 07/12/2016  . Coronary artery disease 07/12/2016  . Personal history of tobacco use, presenting hazards to health 06/11/2016  . Positive colorectal cancer screening using DNA-based stool test 06/07/2016  . Colon cancer screening 05/12/2016  . Postmenopausal 05/12/2016  . Screening for osteoporosis 05/12/2016  . Preventative health care 05/12/2016  . Hyperlipidemia 03/18/2016  . Medication monitoring encounter 03/18/2016  . Abnormal EKG 03/17/2016  . Tobacco abuse 03/17/2016  . GERD (gastroesophageal reflux disease) 03/17/2016  . Breast cancer screening 03/17/2016  . Overweight (BMI 25.0-29.9) 03/17/2016      Current Outpatient  Medications:  .  atorvastatin (LIPITOR) 80 MG tablet, TAKE 1 TABLET(80 MG) BY MOUTH AT BEDTIME (Patient taking differently: Take 80 mg by mouth at bedtime.), Disp: 90 tablet, Rfl: 3 .  ezetimibe (ZETIA) 10 MG tablet, Take 1 tablet (10 mg total) by mouth at bedtime., Disp: , Rfl:  .  Multiple Vitamin (MULTIVITAMIN) tablet, Take 1 tablet by mouth daily., Disp: , Rfl:  .  nicotine (NICODERM CQ - DOSED IN MG/24 HOURS) 14 mg/24hr patch, One 14mg  patch chest wall daily (okay to substitute generic), Disp: 28 patch, Rfl: 0 .  sodium chloride 1 g tablet, Take 1 tablet (1 g total) by mouth 2 (two) times daily with a meal., Disp: 60 tablet, Rfl: 0   Allergies  Allergen Reactions  . Sulfa Antibiotics Hives  Past Surgical History:  Procedure Laterality Date  . COLONOSCOPY WITH PROPOFOL N/A 11/28/2017   Procedure: COLONOSCOPY WITH PROPOFOL;  Surgeon: Lin Landsman, MD;  Location: [facility] ENDOSCOPY;  Service: Gastroenterology;  Laterality: N/A;  . INGUINAL HERNIA REPAIR Bilateral   . KNEE ARTHROSCOPY Bilateral   . TUBAL LIGATION       Family History  Problem Relation Age of Onset  . Alzheimer's disease Mother   . Breast cancer Mother   . Cancer Father        lung  . Dementia Sister   . Cancer Maternal Grandmother        breast  . Breast cancer Maternal Grandmother   . Heart attack Maternal Grandfather   . Healthy Sister   . Breast cancer Maternal Aunt   . Bladder Cancer Neg Hx   . Kidney cancer Neg Hx      Social History   Tobacco Use  . Smoking status: Current Some Day Smoker    Packs/day: 0.25    Years: 52.00    Pack years: 13.00    Types: Cigarettes    Last attempt to quit: 03/20/2018    Years since quitting: 2.1  . Smokeless tobacco: Never Used  Substance Use Topics  . Alcohol use: Yes    Comment: socially    With staff assistance, above reviewed with the patient today.  ROS: As per HPI, otherwise no specific complaints on a limited and focused system review    No results found for this or any previous visit (from the past 72 hour(s)).   PHQ2/9: Depression screen [facility] 2/9 04/30/2020 04/24/2020 01/16/2020 11/16/2018 04/19/2018  Decreased Interest 0 0 0 0 0  Down, Depressed, Hopeless 0 0 0 0 0  PHQ - 2 Score 0 0 0 0 0  Altered sleeping - - - - 0  Tired, decreased energy - - - - 0  Change in appetite - - - - 0  Feeling bad or failure about yourself  - - - - 0  Trouble concentrating - - - - 0  Moving slowly or fidgety/restless - - - - 0  Suicidal thoughts - - - - 0  PHQ-9 Score - - - - 0  Difficult doing work/chores - - - - Not difficult at all   PHQ-2/9 Result is neg  Fall Risk: Fall Risk  04/30/2020 04/24/2020 01/16/2020 11/16/2018 04/19/2018  Falls in the past year? 1 0 0 0 0  Number falls in past yr: 1 0 0 0 -  Injury with Fall? 0 0 0 0 -  Risk for fall due to : - No Fall Risks - - -  Risk for fall due to: Comment - - - - -  Follow up - Falls prevention discussed Falls evaluation completed Falls prevention discussed -      Objective:   Vitals:   04/30/20 1336 04/30/20 1337  BP: (!) 150/70 130/60  Pulse: 92   Resp: 16   Temp: 97.8 F (36.6 C)   TempSrc: Oral   SpO2: 97%   Weight: 140 lb (63.5 kg)   Height: 5\' 8"  (1.727 m)     Body mass index is 21.29 kg/m.  Physical Exam   NAD, masked, pleasant HEENT - [place]/AT, sclera anicteric, PERRL, + glasses, EOMI, conj - non-inj'ed, pharynx clear Neck - supple, no adenopathy, no TM and no nodule palpated, carotids 2+ and = without bruits bilat Car - RRR without m/g/r Pulm- RR and effort normal at rest, CTA without  wheeze or rales Abd - soft, NT diffusely, ND,  Back - no CVA tenderness Ext - no LE edema,  Neuro/psychiatric - affect was not flat, appropriate with conversation      Alert and oriented      Grossly non-focal - good strength on testing extremities, sensation intact to LT in distal extremities      Speech  normal    Results for orders placed or performed during the hospital  encounter of 04/25/20  Resp Panel by RT-PCR (Flu A&B, Covid) Nasopharyngeal Swab   Specimen: Nasopharyngeal Swab; Nasopharyngeal(NP) swabs in vial transport medium  Result Value Ref Range   SARS Coronavirus 2 by RT PCR NEGATIVE NEGATIVE   Influenza A by PCR NEGATIVE NEGATIVE   Influenza B by PCR NEGATIVE NEGATIVE  Basic metabolic panel  Result Value Ref Range   Sodium 122 (L) 135 - 145 mmol/L   Potassium 3.4 (L) 3.5 - 5.1 mmol/L   Chloride 85 (L) 98 - 111 mmol/L   CO2 24 22 - 32 mmol/L   Glucose, Bld 124 (H) 70 - 99 mg/dL   BUN 19 8 - 23 mg/dL   Creatinine, Ser 0.81 0.44 - 1.00 mg/dL   Calcium 8.8 (L) 8.9 - 10.3 mg/dL   GFR, Estimated >60 >60 mL/min   Anion gap 13 5 - 15  CBC  Result Value Ref Range   WBC 4.6 4.0 - 10.5 K/uL   RBC 3.81 (L) 3.87 - 5.11 MIL/uL   Hemoglobin 12.3 12.0 - 15.0 g/dL   HCT 35.6 (L) 36.0 - 46.0 %   MCV 93.4 80.0 - 100.0 fL   MCH 32.3 26.0 - 34.0 pg   MCHC 34.6 30.0 - 36.0 g/dL   RDW 14.5 11.5 - 15.5 %   Platelets 169 150 - 400 K/uL   nRBC 0.0 0.0 - 0.2 %  Urinalysis, Complete w Microscopic Urine, Clean Catch  Result Value Ref Range   Color, Urine YELLOW (A) YELLOW   APPearance HAZY (A) CLEAR   Specific Gravity, Urine 1.016 1.005 - 1.030   pH 5.0 5.0 - 8.0   Glucose, UA NEGATIVE NEGATIVE mg/dL   Hgb urine dipstick MODERATE (A) NEGATIVE   Bilirubin Urine NEGATIVE NEGATIVE   Ketones, ur 20 (A) NEGATIVE mg/dL   Protein, ur 100 (A) NEGATIVE mg/dL   Nitrite NEGATIVE NEGATIVE   Leukocytes,Ua SMALL (A) NEGATIVE   RBC / HPF 11-20 0 - 5 RBC/hpf   WBC, UA 6-10 0 - 5 WBC/hpf   Bacteria, UA RARE (A) NONE SEEN   Squamous Epithelial / LPF 0-5 0 - 5   Mucus PRESENT    Hyaline Casts, UA PRESENT   Sodium, urine, random  Result Value Ref Range   Sodium, Ur 65 mmol/L  Osmolality  Result Value Ref Range   Osmolality 262 (L) 275 - 295 mOsm/kg  Osmolality, urine  Result Value Ref Range   Osmolality, Ur 527 300 - 900 mOsm/kg  Magnesium  Result Value Ref  Range   Magnesium 1.9 1.7 - 2.4 mg/dL  Basic metabolic panel  Result Value Ref Range   Sodium 122 (L) 135 - 145 mmol/L   Potassium 3.7 3.5 - 5.1 mmol/L   Chloride 92 (L) 98 - 111 mmol/L   CO2 23 22 - 32 mmol/L   Glucose, Bld 88 70 - 99 mg/dL   BUN 15 8 - 23 mg/dL   Creatinine, Ser 0.61 0.44 - 1.00 mg/dL   Calcium 8.2 (L) 8.9 - 10.3 mg/dL  GFR, Estimated >60 >60 mL/min   Anion gap 7 5 - 15  Glucose, capillary  Result Value Ref Range   Glucose-Capillary 80 70 - 99 mg/dL  Sodium  Result Value Ref Range   Sodium 123 (L) 135 - 145 mmol/L  Sodium  Result Value Ref Range   Sodium 128 (L) 135 - 145 mmol/L  Hepatic function panel (Baseline)  Result Value Ref Range   Total Protein 6.7 6.5 - 8.1 g/dL   Albumin 3.2 (L) 3.5 - 5.0 g/dL   AST 30 15 - 41 U/L   ALT 16 0 - 44 U/L   Alkaline Phosphatase 72 38 - 126 U/L   Total Bilirubin 0.9 0.3 - 1.2 mg/dL   Bilirubin, Direct 0.1 0.0 - 0.2 mg/dL   Indirect Bilirubin 0.8 0.3 - 0.9 mg/dL  Basic metabolic panel  Result Value Ref Range   Sodium 130 (L) 135 - 145 mmol/L   Potassium 3.7 3.5 - 5.1 mmol/L   Chloride 99 98 - 111 mmol/L   CO2 24 22 - 32 mmol/L   Glucose, Bld 92 70 - 99 mg/dL   BUN 9 8 - 23 mg/dL   Creatinine, Ser 0.61 0.44 - 1.00 mg/dL   Calcium 8.6 (L) 8.9 - 10.3 mg/dL   GFR, Estimated >60 >60 mL/min   Anion gap 7 5 - 15  TSH  Result Value Ref Range   TSH 2.763 0.350 - 4.500 uIU/mL  T4, free  Result Value Ref Range   Free T4 1.20 (H) 0.61 - 1.12 ng/dL  Glucose, capillary  Result Value Ref Range   Glucose-Capillary 75 70 - 99 mg/dL  Troponin I (High Sensitivity)  Result Value Ref Range   Troponin I (High Sensitivity) 8 <18 ng/L  Troponin I (High Sensitivity)  Result Value Ref Range   Troponin I (High Sensitivity) 8 <18 ng/L       Assessment & Plan:   1. Encounter for examination following treatment at hospital She was recently discharged from the hospital 04/27/2020 and has continued to improve since  discharge.  2. Syncope, unspecified syncope type No obvious cardiac source identified of concern, with her hydration status and electrolyte disturbance felt to be a possible contributor  3. SIADH (syndrome of inappropriate ADH production) ([place]) Diagnosed during her hospital admission, and spent time educating her on this today as well.  The exact cause is still unclear.  Noted there are many things in the differential, and nephrology is important to follow-up with to help [place] continuing the work-up for a potential source. She did have a pulmonary nodule, with one type of lung cancer, small cell cancer a possible source of SIADH.  Did discuss potentially getting a pulmonary opinion on this, and it was encouraging that it had not increased significantly in size from previous.  Will await nephrology's input before referring to pulmonary. Noted there are many other potential causes as well. Continuing with the salt tablets and fluid restriction as she is doing was recommended today.  4. Thyroid nodule Do feel getting ultrasound is appropriate, and one was ordered today. - US THYROID; Future  5. Pulmonary nodule As noted above.  6. Hyperlipidemia, familial, high LDL Continues on the statin product and Zetia product with last lipid panel reviewed.  7. Tobacco abuse She is currently wearing a patch to help with tobacco cessation, and strongly encouraged that today.  8. Hyponatremia Noted on recent hospital admission, with SIADH diagnosed.  She has a follow-up appointment with nephrology 1  week from today, and felt best to recheck her labs at that time, as it is just been 3 days presently since she has been discharged from the hospital. Also await nephrology's help and input We will schedule follow-up in early January, and can follow-up sooner as needed.   Addendum:  I called the patient at 4:30 on 05/01/2020. I wanted to follow-up on a couple of those issues with her recent cancer  screenings. She noted that when she was informed of the mammogram results, the area of potential concern was very small, and she has not yet heard back from them to schedule the follow-up diagnostic mammogram and the ultrasound if needed.  There were orders in the chart for these, and I will try to help getting these follow-ups scheduled with help from my staff here.  I also noted that she had not followed up with the gastroenterology referral provided after our September visit.  She noted she was not contacted by them. I did review that colonoscopy report from July 2019, and I did recommend a 1 year follow-up for surveillance after piecemeal polypectomy.  I do think a follow-up is appropriate, and will put another referral through today to help get that moving.  They should contact her after the referral is placed.  She has an appointment with nephrology scheduled for next week, and await their further input as well.  She noted today she was doing well, and had no further questions or concerns presently.     Towanda Malkin, MD 04/30/20 2:54 PM

## 2020-04-29 NOTE — Telephone Encounter (Signed)
Transition Care Management Follow-up Telephone Call  Date of discharge and from where: 04/27/20  How have you been since you were released from the hospital? Pt states she is doing okay. Denies any syncopal episodes since discharge but has felt slightly dizzy up sitting up to get out of bed but feels better after sitting on side of bed for a minute.  Any questions or concerns? No  Items Reviewed:  Did the pt receive and understand the discharge instructions provided? Yes   Medications obtained and verified? Yes   Other? No   Any new allergies since your discharge? No   Dietary orders reviewed? Yes  Do you have support at home? Yes   Home Care and Equipment/Supplies: Were home health services ordered? no Were any new equipment or medical supplies ordered?  No  Functional Questionnaire: (I = Independent and D = Dependent) ADLs: I  Bathing/Dressing- I  Meal Prep- I  Eating- I  Maintaining continence- I  Transferring/Ambulation- I  Managing Meds- I  Follow up appointments reviewed:   PCP Hospital f/u appt confirmed? Yes  Scheduled to see Dr. Roxan Hockey on 04/30/20 @ 1:20.  [facility] f/u appt confirmed? No  Pt awaiting appt from Dr. Rolly Salter for nephrology.  Are transportation arrangements needed? No   If their condition worsens, is the pt aware to call PCP or go to the Emergency Dept.? Yes  Was the patient provided with contact information for the PCP's office or ED? Yes  Was to pt encouraged to call back with questions or concerns? Yes

## 2020-04-30 ENCOUNTER — Ambulatory Visit (INDEPENDENT_AMBULATORY_CARE_PROVIDER_SITE_OTHER): Payer: Medicare Other | Admitting: Internal Medicine

## 2020-04-30 ENCOUNTER — Other Ambulatory Visit: Payer: Self-pay

## 2020-04-30 ENCOUNTER — Encounter: Payer: Self-pay | Admitting: Internal Medicine

## 2020-04-30 VITALS — BP 130/60 | HR 92 | Temp 97.8°F | Resp 16 | Ht 68.0 in | Wt 140.0 lb

## 2020-04-30 DIAGNOSIS — R911 Solitary pulmonary nodule: Secondary | ICD-10-CM

## 2020-04-30 DIAGNOSIS — K635 Polyp of colon: Secondary | ICD-10-CM

## 2020-04-30 DIAGNOSIS — R55 Syncope and collapse: Secondary | ICD-10-CM

## 2020-04-30 DIAGNOSIS — E222 Syndrome of inappropriate secretion of antidiuretic hormone: Secondary | ICD-10-CM | POA: Diagnosis not present

## 2020-04-30 DIAGNOSIS — Z720 Tobacco use: Secondary | ICD-10-CM

## 2020-04-30 DIAGNOSIS — E7849 Other hyperlipidemia: Secondary | ICD-10-CM

## 2020-04-30 DIAGNOSIS — E041 Nontoxic single thyroid nodule: Secondary | ICD-10-CM

## 2020-04-30 DIAGNOSIS — Z09 Encounter for follow-up examination after completed treatment for conditions other than malignant neoplasm: Secondary | ICD-10-CM | POA: Diagnosis not present

## 2020-04-30 DIAGNOSIS — E871 Hypo-osmolality and hyponatremia: Secondary | ICD-10-CM

## 2020-04-30 NOTE — Patient Instructions (Signed)
Syndrome of Inappropriate Antidiuretic Hormone Syndrome of inappropriate antidiuretic hormone (SIADH) is a condition in which extra antidiuretic hormone (ADH) is produced in the body. ADH controls water levels in the body. When too much of the hormone is produced, it can cause problems such as low salt (sodium) levels in your blood and reduced urine production. When this happens, the body retains too much water, which can cause various symptoms. Symptoms can range from mild to severe. SIADH is most common in people who are hospitalized for another reason. What are the causes? This condition may be caused by:  Tumors.  Injury to the brain.  Infection or inflammation in the brain, such as meningitis or encephalitis.  Lung infections or diseases.  Cancer.  Major surgery.  Extreme exercise.  Stroke.  Guillain-Barr syndrome (GBS).  Medicines, including chemotherapy drugs, NSAIDs, and pain medicines.  Problems with breathing (respiratory failure).  AIDS. What increases the risk? The following factors may make you more likely to develop this condition:  Increasing age.  Recent major surgery.  Hospitalization. What are the signs or symptoms? Symptoms of this condition include:  Irritability.  Muscle cramps.  Nausea or vomiting.  Mental changes, such as memory problems, aggressiveness, confusion, or hallucinations.  Seizure.  Coma. How is this diagnosed? This condition may be diagnosed based on:  Your medical history.  A physical exam.  Blood tests.  Urine tests.  Chest X-ray or brain scan. How is this treated? Treatment for this condition focuses on relieving symptoms. Treatment may include:  Restricting fluid intake.  Receiving sterile salt water (saline) slowly through an IV.  Medicines to help remove water from the body (diuretics).  Medicines to help control sodium levels. Initial treatment for SIADH is done in a hospital, so your health care team  can monitor your condition closely. Other treatments may focus on treating the underlying cause of the condition. Follow these instructions at home:  Follow your health care provider's instructions on restricting your intake of fluids.  Take over-the-counter and prescription medicines only as told by your health care provider.  Keep all follow-up visits as told by your health care provider. This is important. Contact a health care provider if you:  Have muscle cramps.  Are nauseous or you vomit. Get help right away if you have:  A seizure.  Loss of consciousness.  Mental changes, such as memory problems, aggressiveness, confusion, or hallucinations. Summary  Syndrome of inappropriate antidiuretic hormone (SIADH) is a condition in which extra antidiuretic hormone (ADH) is produced in the body.  SIADH is most common in people who are hospitalized for another reason.  Initial treatment for SIADH is done in the hospital setting in order to monitor your condition closely.  Follow your health care provider's instructions on restricting your intake of fluids. This information is not intended to replace advice given to you by your health care provider. Make sure you discuss any questions you have with your health care provider. Document Revised: 05/26/2017 Document Reviewed: 05/26/2017 Elsevier Patient Education  2020 [employer].

## 2020-05-01 NOTE — Addendum Note (Signed)
Addended by: Lebron Conners D on: 05/01/2020 04:42 PM   Modules accepted: Orders

## 2020-05-02 ENCOUNTER — Encounter: Payer: Self-pay | Admitting: *Deleted

## 2020-05-07 ENCOUNTER — Telehealth: Payer: Self-pay | Admitting: *Deleted

## 2020-05-07 DIAGNOSIS — E871 Hypo-osmolality and hyponatremia: Secondary | ICD-10-CM | POA: Insufficient documentation

## 2020-05-07 NOTE — Telephone Encounter (Signed)
PT CALLED AND STATED THAT SHE IS CURRENTLY HAVING A LOT OF MEDICAL ISSUES AND WANTS TO GET TO THE BOTTOM OF THAT BEFORE SCHEDULING HER AV / MAMMO APPT - SHE IS AWARE AV ARE NEEDED BUT SHE WCB WHEN SHE IS READY TO SCHD.

## 2020-05-13 ENCOUNTER — Ambulatory Visit: Payer: Medicare Other

## 2020-05-14 ENCOUNTER — Other Ambulatory Visit: Payer: Self-pay

## 2020-05-14 ENCOUNTER — Telehealth (INDEPENDENT_AMBULATORY_CARE_PROVIDER_SITE_OTHER): Payer: Self-pay | Admitting: Gastroenterology

## 2020-05-14 DIAGNOSIS — Z86010 Personal history of colonic polyps: Secondary | ICD-10-CM

## 2020-05-14 MED ORDER — NA SULFATE-K SULFATE-MG SULF 17.5-3.13-1.6 GM/177ML PO SOLN: 1.0000 | Freq: Once | ORAL | 0 refills | Status: AC

## 2020-05-14 NOTE — Progress Notes (Signed)
Gastroenterology Pre-Procedure Review  Request Date: Thursday 05/22/20 Requesting Physician: Dr. Marius Ditch  PATIENT REVIEW QUESTIONS: The patient responded to the following health history questions as indicated:    1. Are you having any GI issues? no 2. Do you have a personal history of Polyps? yes (11/28/17 Polyps noted by Dr. Marius Ditch) 3. Do you have a family history of Colon Cancer or Polyps? no 4. Diabetes Mellitus? no 5. Joint replacements in the past 12 months?no 6. Major health problems in the past 3 months?yes (04/25/20 Syncopy, hyponatremia ER) 7. Any artificial heart valves, MVP, or defibrillator?no    MEDICATIONS & ALLERGIES:    Patient reports the following regarding taking any anticoagulation/antiplatelet therapy:   Plavix, Coumadin, Eliquis, Xarelto, Lovenox, Pradaxa, Brilinta, or Effient? no Aspirin? yes (81 mg)  Patient confirms/reports the following medications:  Current Outpatient Medications  Medication Sig Dispense Refill  . aspirin 81 MG EC tablet Take by mouth.    Marland Kitchen atorvastatin (LIPITOR) 80 MG tablet TAKE 1 TABLET(80 MG) BY MOUTH AT BEDTIME (Patient taking differently: Take 80 mg by mouth at bedtime.) 90 tablet 3  . ezetimibe (ZETIA) 10 MG tablet Take 1 tablet (10 mg total) by mouth at bedtime.    . Multiple Vitamin (MULTIVITAMIN) tablet Take 1 tablet by mouth daily.    . Na Sulfate-K Sulfate-Mg Sulf 17.5-3.13-1.6 GM/177ML SOLN Take 1 kit by mouth once for 1 dose. 354 mL 0  . nicotine (NICODERM CQ - DOSED IN MG/24 HOURS) 14 mg/24hr patch One $RemoveB'14mg'CjAjtShg$  patch chest wall daily (okay to substitute generic) 28 patch 0  . sodium chloride 1 g tablet Take 1 tablet (1 g total) by mouth 2 (two) times daily with a meal. 60 tablet 0   No current facility-administered medications for this visit.    Patient confirms/reports the following allergies:  Allergies  Allergen Reactions  . Sulfa Antibiotics Hives    No orders of the defined types were placed in this  encounter.   AUTHORIZATION INFORMATION Primary Insurance: 1D#: Group #:  Secondary Insurance: 1D#: Group #:  SCHEDULE INFORMATION: Date: 05/22/20 Time: Location:[facility]

## 2020-05-20 ENCOUNTER — Other Ambulatory Visit
Admission: RE | Admit: 2020-05-20 | Discharge: 2020-05-20 | Disposition: A | Discharge status: Home / Self Care | Payer: Medicare Other | Source: Ambulatory Visit | Attending: Gastroenterology | Admitting: Gastroenterology

## 2020-05-20 ENCOUNTER — Other Ambulatory Visit: Payer: Self-pay

## 2020-05-20 DIAGNOSIS — Z20822 Contact with and (suspected) exposure to covid-19: Secondary | ICD-10-CM | POA: Diagnosis not present

## 2020-05-20 DIAGNOSIS — Z01812 Encounter for preprocedural laboratory examination: Secondary | ICD-10-CM | POA: Diagnosis present

## 2020-05-21 LAB — SARS CORONAVIRUS 2 (TAT 6-24 HRS): SARS Coronavirus 2: NEGATIVE

## 2020-05-22 ENCOUNTER — Ambulatory Visit
Admission: RE | Admit: 2020-05-22 | Discharge: 2020-05-22 | Disposition: A | Discharge status: Home / Self Care | Payer: Medicare Other | Attending: Gastroenterology | Admitting: Gastroenterology

## 2020-05-22 ENCOUNTER — Ambulatory Visit: Payer: Medicare Other | Admitting: Anesthesiology

## 2020-05-22 ENCOUNTER — Encounter: Payer: Self-pay | Admitting: Gastroenterology

## 2020-05-22 ENCOUNTER — Encounter
Admission: RE | Disposition: A | Discharge status: Home / Self Care | Payer: Self-pay | Source: Home / Self Care | Attending: Gastroenterology

## 2020-05-22 DIAGNOSIS — Z803 Family history of malignant neoplasm of breast: Secondary | ICD-10-CM | POA: Diagnosis not present

## 2020-05-22 DIAGNOSIS — Z86010 Personal history of colonic polyps: Secondary | ICD-10-CM | POA: Diagnosis not present

## 2020-05-22 DIAGNOSIS — Z820 Family history of epilepsy and other diseases of the nervous system: Secondary | ICD-10-CM | POA: Diagnosis not present

## 2020-05-22 DIAGNOSIS — Z882 Allergy status to sulfonamides status: Secondary | ICD-10-CM | POA: Insufficient documentation

## 2020-05-22 DIAGNOSIS — J432 Centrilobular emphysema: Secondary | ICD-10-CM | POA: Diagnosis not present

## 2020-05-22 DIAGNOSIS — Z8249 Family history of ischemic heart disease and other diseases of the circulatory system: Secondary | ICD-10-CM | POA: Insufficient documentation

## 2020-05-22 DIAGNOSIS — I2510 Atherosclerotic heart disease of native coronary artery without angina pectoris: Secondary | ICD-10-CM | POA: Diagnosis not present

## 2020-05-22 DIAGNOSIS — I700 Atherosclerosis of aorta: Secondary | ICD-10-CM | POA: Insufficient documentation

## 2020-05-22 DIAGNOSIS — Z1211 Encounter for screening for malignant neoplasm of colon: Secondary | ICD-10-CM | POA: Insufficient documentation

## 2020-05-22 DIAGNOSIS — Z7982 Long term (current) use of aspirin: Secondary | ICD-10-CM | POA: Diagnosis not present

## 2020-05-22 DIAGNOSIS — Z79899 Other long term (current) drug therapy: Secondary | ICD-10-CM | POA: Diagnosis not present

## 2020-05-22 DIAGNOSIS — Z801 Family history of malignant neoplasm of trachea, bronchus and lung: Secondary | ICD-10-CM | POA: Diagnosis not present

## 2020-05-22 DIAGNOSIS — Z8052 Family history of malignant neoplasm of bladder: Secondary | ICD-10-CM | POA: Insufficient documentation

## 2020-05-22 HISTORY — PX: COLONOSCOPY WITH PROPOFOL: SHX5780

## 2020-05-22 SURGERY — COLONOSCOPY WITH PROPOFOL
Anesthesia: General

## 2020-05-22 MED ORDER — PROPOFOL 500 MG/50ML IV EMUL
INTRAVENOUS | Status: DC | PRN
  Administered 2020-05-22: 140 ug/kg/min via INTRAVENOUS

## 2020-05-22 MED ORDER — GLYCOPYRROLATE 0.2 MG/ML IJ SOLN
INTRAMUSCULAR | Status: DC | PRN
  Administered 2020-05-22: .1 mg via INTRAVENOUS

## 2020-05-22 MED ORDER — LIDOCAINE HCL (CARDIAC) PF 100 MG/5ML IV SOSY
PREFILLED_SYRINGE | INTRAVENOUS | Status: DC | PRN
  Administered 2020-05-22: 80 mg via INTRAVENOUS

## 2020-05-22 MED ORDER — PROPOFOL 10 MG/ML IV BOLUS
INTRAVENOUS | Status: DC | PRN
  Administered 2020-05-22 (×2): 20 mg via INTRAVENOUS
  Administered 2020-05-22: 40 mg via INTRAVENOUS
  Administered 2020-05-22: 20 mg via INTRAVENOUS

## 2020-05-22 MED ORDER — ONDANSETRON HCL 4 MG/2ML IJ SOLN
INTRAMUSCULAR | Status: DC | PRN
  Administered 2020-05-22: 4 mg via INTRAVENOUS

## 2020-05-22 MED ORDER — SODIUM CHLORIDE 0.9 % IV SOLN: INTRAVENOUS | Status: DC

## 2020-05-22 NOTE — Op Note (Signed)
[facility] Gastroenterology Patient Name: Julie Beck Procedure Date: 05/22/2020 11:19 AM MRN: [id number] Account #: [id number] Date of Birth: Nov 28, 1945 Admit Type: Outpatient Age: 75 Room: [facility] ENDO ROOM 4 Gender: Female Note Status: Finalized Procedure:             Colonoscopy Indications:           Surveillance: History of numerous (> 10) adenomas on                         last colonoscopy (< 3 yrs), High risk colon cancer                         surveillance: Personal history of adenoma (10 mm or                         greater in size), Last colonoscopy: July 2019 Providers:             Lin Landsman MD, MD Referring MD:          Carola Frost. Roxan Hockey (Referring MD) Medicines:             General Anesthesia Complications:         No immediate complications. Estimated blood loss: None. Procedure:             Pre-Anesthesia Assessment:                        - Prior to the procedure, a History and Physical was                         performed, and patient medications and allergies were                         reviewed. The patient is competent. The risks and                         benefits of the procedure and the sedation options and                         risks were discussed with the patient. All questions                         were answered and informed consent was obtained.                         Patient identification and proposed procedure were                         verified by the physician, the nurse, the                         anesthesiologist, the anesthetist and the technician                         in the pre-procedure area in the procedure room in the                         endoscopy suite. Mental Status Examination: alert and  oriented. Airway Examination: normal oropharyngeal                         airway and neck mobility. Respiratory Examination:                         clear to auscultation. CV  Examination: normal.                         Prophylactic Antibiotics: The patient does not require                         prophylactic antibiotics. Prior Anticoagulants: The                         patient has taken no previous anticoagulant or                         antiplatelet agents. ASA Grade Assessment: III - A                         patient with severe systemic disease. After reviewing                         the risks and benefits, the patient was deemed in                         satisfactory condition to undergo the procedure. The                         anesthesia plan was to use general anesthesia.                         Immediately prior to administration of medications,                         the patient was re-assessed for adequacy to receive                         sedatives. The heart rate, respiratory rate, oxygen                         saturations, blood pressure, adequacy of pulmonary                         ventilation, and response to care were monitored                         throughout the procedure. The physical status of the                         patient was re-assessed after the procedure.                        After obtaining informed consent, the colonoscope was                         passed under direct vision. Throughout the procedure,  the patient's blood pressure, pulse, and oxygen                         saturations were monitored continuously. The                         Colonoscope was introduced through the anus and                         advanced to the the terminal ileum, with                         identification of the appendiceal orifice and IC                         valve. The colonoscopy was performed with moderate                         difficulty due to significant looping. Successful                         completion of the procedure was aided by applying                         abdominal pressure. The  patient tolerated the                         procedure well. The quality of the bowel preparation                         was evaluated using the BBPS [facility] Bowel Preparation                         Scale) with scores of: Right Colon = 3, Transverse                         Colon = 3 and Left Colon = 3 (entire mucosa seen well                         with no residual staining, small fragments of stool or                         opaque liquid). The total BBPS score equals 9. Findings:      The perianal and digital rectal examinations were normal. Pertinent       negatives include normal sphincter tone and no palpable rectal lesions.      The terminal ileum appeared normal.      A 5 mm post polypectomy scar was found in the transverse colon. The scar       tissue was healthy in appearance. There was no evidence of the previous       polyp. The scar was unremarkable in appearance.      The retroflexed view of the distal rectum and anal verge was normal and       showed no anal or rectal abnormalities.      The exam was otherwise without abnormality. Impression:            - The examined portion of  the ileum was normal.                        - Post-polypectomy scar in the transverse colon.                        - The distal rectum and anal verge are normal on                         retroflexion view.                        - The examination was otherwise normal.                        - No specimens collected. Recommendation:        - Discharge patient to home (with escort).                        - Resume previous diet today.                        - Continue present medications.                        - Repeat colonoscopy in 5 years for surveillance. Procedure Code(s):     --- Professional ---                        Z3664, Colorectal cancer screening; colonoscopy on                         individual at high risk Diagnosis Code(s):     --- Professional ---                         [phone], Other specified postprocedural states                        Z86.010, Personal history of colonic polyps CPT copyright 2019 American Medical Association. All rights reserved. The codes documented in this report are preliminary and upon coder review may  be revised to meet current compliance requirements. Dr. Libby Maw Toney Reil MD, MD 05/22/2020 11:43:49 AM This report has been signed electronically. Number of Addenda: 0 Note Initiated On: 05/22/2020 11:19 AM Scope Withdrawal Time: 0 hours 6 minutes 34 seconds  Total Procedure Duration: 0 hours 14 minutes 23 seconds  Estimated Blood Loss:  Estimated blood loss: none.      [facility]

## 2020-05-22 NOTE — Anesthesia Preprocedure Evaluation (Signed)
Anesthesia Evaluation  Patient identified by MRN, date of birth, ID band Patient awake    Reviewed: Allergy & Precautions, H&P , NPO status , Patient's Chart, lab work & pertinent test results  History of Anesthesia Complications Negative for: history of anesthetic complications  Airway Mallampati: III  TM Distance: >3 FB Neck ROM: full    Dental  (+) Chipped   Pulmonary neg shortness of breath, COPD, Current Smoker,    Pulmonary exam normal        Cardiovascular Exercise Tolerance: Good (-) angina+ CAD  (-) DOE Normal cardiovascular exam     Neuro/Psych negative neurological ROS  negative psych ROS   GI/Hepatic Neg liver ROS, GERD  Medicated and Controlled,  Endo/Other  negative endocrine ROS  Renal/GU negative Renal ROS  negative genitourinary   Musculoskeletal  (+) Arthritis ,   Abdominal   Peds  Hematology negative hematology ROS (+)   Anesthesia Other Findings Past Medical History: 07/12/2016: Abnormal CT of liver     Comment:  Chest CT Feb 2018 No date: Arthritis 07/12/2016: Centrilobular emphysema [facility])     Comment:  Chest CT Feb 2018 07/12/2016: Coronary artery disease     Comment:  Followed by Dr. Okey Dupre, seen on chest CT Feb 2018 03/17/2016: Epigastric abdominal pain No date: Hyperlipidemia 03/17/2016: Overweight (BMI 25.0-29.9) 07/12/2016: Thoracic aortic atherosclerosis (HCC)     Comment:  Chest CT scan Feb 2018 03/17/2016: Tobacco abuse  Past Surgical History: 11/28/2017: COLONOSCOPY WITH PROPOFOL; N/A     Comment:  Procedure: COLONOSCOPY WITH PROPOFOL;  Surgeon: Toney Reil, MD;  Location: [facility] ENDOSCOPY;  Service:               Gastroenterology;  Laterality: N/A; No date: INGUINAL HERNIA REPAIR; Bilateral No date: KNEE ARTHROSCOPY; Bilateral No date: TUBAL LIGATION  BMI    Body Mass Index: 21.74 kg/m      Reproductive/Obstetrics negative OB ROS                              Anesthesia Physical Anesthesia Plan  ASA: III  Anesthesia Plan: General   Post-op Pain Management:    Induction: Intravenous  PONV Risk Score and Plan: Propofol infusion and TIVA  Airway Management Planned: Natural Airway and Nasal Cannula  Additional Equipment:   Intra-op Plan:   Post-operative Plan:   Informed Consent: I have reviewed the patients History and Physical, chart, labs and discussed the procedure including the risks, benefits and alternatives for the proposed anesthesia with the patient or authorized representative who has indicated his/her understanding and acceptance.     Dental Advisory Given  Plan Discussed with: Anesthesiologist, CRNA and Surgeon  Anesthesia Plan Comments: (Patient consented for risks of anesthesia including but not limited to:  - adverse reactions to medications - risk of airway placement if required - damage to eyes, teeth, lips or other oral mucosa - nerve damage due to positioning  - sore throat or hoarseness - Damage to heart, brain, nerves, lungs, other parts of body or loss of life  Patient voiced understanding.)        Anesthesia Quick Evaluation

## 2020-05-22 NOTE — Transfer of Care (Signed)
Immediate Anesthesia Transfer of Care Note  Patient: Julie Beck  Procedure(s) Performed: COLONOSCOPY WITH PROPOFOL (N/A )  Patient Location: PACU and Endoscopy Unit  Anesthesia Type:General  Level of Consciousness: awake and patient cooperative  Airway & Oxygen Therapy: Patient Spontanous Breathing  Post-op Assessment: Report given to RN and Post -op Vital signs reviewed and stable  Post vital signs: Reviewed and stable  Last Vitals:  Vitals Value Taken Time  BP 114/59   Temp    Pulse 77 05/22/20 1144  Resp 22 05/22/20 1144  SpO2 100 % 05/22/20 1144  Vitals shown include unvalidated device data.  Last Pain:  Vitals:   05/22/20 0937  TempSrc: Temporal  PainSc: 0-No pain         Complications: No complications documented.

## 2020-05-22 NOTE — H&P (Signed)
Arlyss Repress, MD [address]  [address]  [place], [place] [address]  Main: [phone]  Fax: [phone] Pager: [phone]  Primary Care Physician:  Jamelle Haring, MD Primary Gastroenterologist:  Dr. Arlyss Repress  Pre-Procedure History & Physical: HPI:  Julie Beck is a 75 y.o. female is here for an colonoscopy.   Past Medical History:  Diagnosis Date  . Abnormal CT of liver 07/12/2016   Chest CT Feb 2018  . Arthritis   . Centrilobular emphysema (HCC) 07/12/2016   Chest CT Feb 2018  . Coronary artery disease 07/12/2016   Followed by Dr. Okey Dupre, seen on chest CT Feb 2018  . Epigastric abdominal pain 03/17/2016  . Hyperlipidemia   . Overweight (BMI 25.0-29.9) 03/17/2016  . Thoracic aortic atherosclerosis (HCC) 07/12/2016   Chest CT scan Feb 2018  . Tobacco abuse 03/17/2016    Past Surgical History:  Procedure Laterality Date  . COLONOSCOPY WITH PROPOFOL N/A 11/28/2017   Procedure: COLONOSCOPY WITH PROPOFOL;  Surgeon: Toney Reil, MD;  Location: [facility] ENDOSCOPY;  Service: Gastroenterology;  Laterality: N/A;  . INGUINAL HERNIA REPAIR Bilateral   . KNEE ARTHROSCOPY Bilateral   . TUBAL LIGATION      Prior to Admission medications   Medication Sig Start Date End Date Taking? Authorizing Provider  aspirin 81 MG EC tablet Take by mouth. 07/12/16  Yes [provider]  atorvastatin (LIPITOR) 80 MG tablet TAKE 1 TABLET(80 MG) BY MOUTH AT BEDTIME Patient taking differently: Take 80 mg by mouth at bedtime. 03/10/20   Jamelle Haring, MD  ezetimibe (ZETIA) 10 MG tablet Take 1 tablet (10 mg total) by mouth at bedtime. 04/27/20   Alford Highland, MD  Multiple Vitamin (MULTIVITAMIN) tablet Take 1 tablet by mouth daily.    [provider]  nicotine (NICODERM CQ - DOSED IN MG/24 HOURS) 14 mg/24hr patch One 14mg  patch chest wall daily (okay to substitute generic) 04/27/20   14/12/21, MD  sodium chloride 1 g tablet Take 1 tablet (1 g  total) by mouth 2 (two) times daily with a meal. 04/27/20   14/12/21, MD    Allergies as of 05/14/2020 - Review Complete 04/30/2020  Allergen Reaction Noted  . Sulfa antibiotics Hives 11/10/2013    Family History  Problem Relation Age of Onset  . Alzheimer's disease Mother   . Breast cancer Mother   . Cancer Father        lung  . Dementia Sister   . Cancer Maternal Grandmother        breast  . Breast cancer Maternal Grandmother   . Heart attack Maternal Grandfather   . Healthy Sister   . Breast cancer Maternal Aunt   . Bladder Cancer Neg Hx   . Kidney cancer Neg Hx     Social History   Socioeconomic History  . Marital status: Divorced    Spouse name: Not on file  . Number of children: 2  . Years of education: some college  . Highest education level: 12th grade  Occupational History    Employer: OTHER    Comment: hairdresser  Tobacco Use  . Smoking status: Current Some Day Smoker    Packs/day: 0.25    Years: 52.00    Pack years: 13.00    Types: Cigarettes    Last attempt to quit: 03/20/2018    Years since quitting: 2.1  . Smokeless tobacco: Never Used  . Tobacco comment: quit April 25, 2020  Vaping Use  .  Vaping Use: Never used  Substance and Sexual Activity  . Alcohol use: Yes    Comment: socially  . Drug use: No  . Sexual activity: Not Currently    Partners: Female  Other Topics Concern  . Not on file  Social History Narrative   Pt lives alone   Social Determinants of Health   Financial Resource Strain: Low Risk   . Difficulty of Paying Living Expenses: Not hard at all  Food Insecurity: No Food Insecurity  . Worried About Programme researcher, broadcasting/film/video in the Last Year: Never true  . Ran Out of Food in the Last Year: Never true  Transportation Needs: No Transportation Needs  . Lack of Transportation (Medical): No  . Lack of Transportation (Non-Medical): No  Physical Activity: Insufficiently Active  . Days of Exercise per Week: 1 day  . Minutes of  Exercise per Session: 30 min  Stress: No Stress Concern Present  . Feeling of Stress : Not at all  Social Connections: Moderately Isolated  . Frequency of Communication with Friends and Family: More than three times a week  . Frequency of Social Gatherings with Friends and Family: More than three times a week  . Attends Religious Services: More than 4 times per year  . Active Member of Clubs or Organizations: No  . Attends Banker Meetings: Never  . Marital Status: Divorced  Catering manager Violence: Not At Risk  . Fear of Current or Ex-Partner: No  . Emotionally Abused: No  . Physically Abused: No  . Sexually Abused: No    Review of Systems: See HPI, otherwise negative ROS  Physical Exam: BP 131/77   Pulse 85   Temp (!) 97.5 F (36.4 C) (Temporal)   Resp 17   Ht 5\' 8"  (1.727 m)   Wt 64.9 kg   SpO2 100%   BMI 21.74 kg/m  General:   Alert,  pleasant and cooperative in NAD Head:  Normocephalic and atraumatic. Neck:  Supple; no masses or thyromegaly. Lungs:  Clear throughout to auscultation.    Heart:  Regular rate and rhythm. Abdomen:  Soft, nontender and nondistended. Normal bowel sounds, without guarding, and without rebound.   Neurologic:  Alert and  oriented x4;  grossly normal neurologically.  Impression/Plan: Julie Beck is here for an colonoscopy to be performed for personal history of colon polyps  Risks, benefits, limitations, and alternatives regarding  colonoscopy have been reviewed with the patient.  Questions have been answered.  All parties agreeable.   Alver Fisher, MD  05/22/2020, 10:36 AM

## 2020-05-22 NOTE — Anesthesia Procedure Notes (Signed)
Procedure Name: MAC Date/Time: 05/22/2020 11:21 AM Performed by: Lily Peer, Sharran Caratachea, CRNA Pre-anesthesia Checklist: Patient identified, Emergency Drugs available, Suction available, Patient being monitored and Timeout performed Oxygen Delivery Method: Nasal cannula Induction Type: IV induction

## 2020-05-22 NOTE — Anesthesia Postprocedure Evaluation (Signed)
Anesthesia Post Note  Patient: Julie Beck  Procedure(s) Performed: COLONOSCOPY WITH PROPOFOL (N/A )  Patient location during evaluation: Endoscopy Anesthesia Type: General Level of consciousness: awake and alert Pain management: pain level controlled Vital Signs Assessment: post-procedure vital signs reviewed and stable Respiratory status: spontaneous breathing, nonlabored ventilation, respiratory function stable and patient connected to nasal cannula oxygen Cardiovascular status: blood pressure returned to baseline and stable Postop Assessment: no apparent nausea or vomiting Anesthetic complications: no   No complications documented.   Last Vitals:  Vitals:   05/22/20 1155 05/22/20 1205  BP: 126/64 (!) 141/68  Pulse: 70 70  Resp: 15 11  Temp:    SpO2: 99% 99%    Last Pain:  Vitals:   05/22/20 1215  TempSrc:   PainSc: 0-No pain                 Cleda Mccreedy Jinnie Onley

## 2020-05-27 NOTE — Progress Notes (Signed)
Patient ID: Julie Beck, female    DOB: May 27, 1945, 75 y.o.   MRN: [id number]  PCP: Jamelle Haring, MD  Chief Complaint  Patient presents with  . Follow-up    Subjective:   Julie Beck is a 75 y.o. female, presents to clinic with CC of the following:  Chief Complaint  Patient presents with  . Follow-up    HPI:  Patient is a 75 year old female Last visit with me was 04/30/2020 for a hospital follow-up Follows up today Here with her son  She had the repeat colonoscopy 05/22/2020 with the following impression/plan:  Impression:            - The examined portion of the ileum was normal.                        - Post-polypectomy scar in the transverse colon.                        - The distal rectum and anal verge are normal on                         retroflexion view.                        - The examination was otherwise normal.                        - No specimens collected. Recommendation:        - Discharge patient to home (with escort).                        - Resume previous diet today.                        - Continue present medications.                        - Repeat colonoscopy in 5 years for surveillance  After our last visit she did follow-up with nephrology 05/07/2020 with their assessment and plan as follows:  Impression/Recommendations   Julie Beck is a 75 year old female with a history of athritis, tobacco abuse, hyperlipidemia, emphysema and coronary artery disease who presents as a hospital follow up for SIADH.   1. SIADH  - patient's renal function from [facility] was >60, creatinine 0.61.  - reviewed labs from [facility]: Osmolality 262, Urine osmolality 527, urine sodium 65, K 3.7, TSH 2.7,  - not yet started on a loop diuretic. No history of thiazide diuretics. No medication changes prior to hospitilization  - encouraged patient to follow fluid restriction: 32 ounces of fluid total  - consider lasix 20mg  po bid or torsemide 20 mg qd  - obtain  updated renal fxn panel, urine osmolality, urine sodium and serum osmolality.   2. Elevated blood pressure  - encouraged patient to check blood pressure at home   She was started on Lasix 20 mg daily after that visit to help with her sodium.   Since last visit, he notes he has been feeling good.  Still limiting her fluids significantly, to about 32 ounces a day and is taking the Lasix product. Is back to work, and is back full-time, still with frequent breaks. Blood pressure checks at home  have been okay as was the one today in the office Weight has been relatively stable Wt Readings from Last 3 Encounters:  05/28/20 140 lb 11.2 oz (63.8 kg)  05/22/20 143 lb (64.9 kg)  04/30/20 140 lb (63.5 kg)        Hyperlipidemia-onatorvastatin 80 mg dailyand zetia10 mg daily Taking medicines regularly Goal is LDL less than 70 Recent Labs       Lab Results  Component Value Date   CHOL 127 01/16/2020   HDL 40 (L) 01/16/2020   LDLCALC 73 01/16/2020   TRIG 65 01/16/2020   CHOLHDL 3.2 01/16/2020      Coronary artery disease/Thoracic aortic atherosclerosis ([place]) (Chronic) Followed by cardiology in the past, although not in the recent past, as cardiology recommend follow-up as needed after last seen in November 2017. Denies recent chest pain, palpitations, shortness of breath, increased lower extremity swelling,   Tobacco use- Quit smoking, off a month now, She notes she is wearing a nicotine patch now, and has been helpful  Pulmonary nodule. CT scan from hospitalization did show a nodule and it was similar size as it was 2 years ago.  She has a follow-up CT for lung cancer screening planned for the end of February.  May consider a PET scan in the near future (pending results of other tests ordered presently)   Thyroid nodule. TSH and free T4 were in the normal range.  An ultrasound was ordered, not yet scheduled.  May need a biopsy at some point as well.   Cancer  screenings Colorectal Screening:Completed 7/15/19with multiple polyps noted and a follow-up recommended in 1 yearfor surveillance after piecemeal polypectomy. Had the follow-up as above, with no findings of concern and a follow-up recommended again in 5 years.  Mammogram: Completed12/10/2019 with right breast possible calcifications requiring further evaluation and left breast possible masses requiring further evaluation noted. She noted after our last visit that when she was informed of the mammogram results, the area of potential concern was very small, and she has not yet heard back from them to schedule the follow-up diagnostic mammogram and the ultrasound if needed.  He did try to make a phone call to schedule and still have not been successful in getting this scheduled.  A diagnostic mammogram and possibly ultrasounds of both breasts were recommended for follow-up, and await those results presently.  Bone Density:Completed 04/22/14. Results reflect OSTEOPENIA. Repeat every 2 years.Pt declined repeat screeningon the annual Medicare evaluation 11/2018, declined on our last visit..  Lung Cancer Screening: Last screening CT completed2/24/2021with the following impression: IMPRESSION: 1. Lung-RADS 2, benign appearance or behavior. Continue annual screening with low-dose chest CT without contrast in 12 months. 2. Aortic atherosclerosis (ICD10-I70.0). Coronary artery calcification. 3. Emphysema (ICD10-J43.9).  Last women's health evaluation - many years ago, no recent sx's. Discussed evaluation in the near future.She was told by her gynecologist previously she did not need further Paps and pelvic exams.  She denies any concerning symptoms in the recent past with no vaginal bleeding or suprapubic pains.           Patient Active Problem List   Diagnosis Date Noted  . History of colonic polyps   . Hypo-osmolality and hyponatremia 05/07/2020  . SIADH (syndrome of  inappropriate ADH production) ([place])   . Pulmonary nodule   . Thyroid nodule   . Syncope 04/25/2020  . Hyponatremia 04/25/2020  . Osteopenia 01/16/2020  . Polyp of colon 01/16/2020  . Cirrhosis of liver ([place]) 07/20/2016  .  Centrilobular emphysema ([place]) 07/12/2016  . Thoracic aortic atherosclerosis ([place]) 07/12/2016  . Coronary artery disease 07/12/2016  . Personal history of tobacco use, presenting hazards to health 06/11/2016  . Positive colorectal cancer screening using DNA-based stool test 06/07/2016  . Colon cancer screening 05/12/2016  . Postmenopausal 05/12/2016  . Screening for osteoporosis 05/12/2016  . Preventative health care 05/12/2016  . Hyperlipidemia 03/18/2016  . Medication monitoring encounter 03/18/2016  . Abnormal EKG 03/17/2016  . Tobacco abuse 03/17/2016  . GERD (gastroesophageal reflux disease) 03/17/2016  . Breast cancer screening 03/17/2016  . Overweight (BMI 25.0-29.9) 03/17/2016      Current Outpatient Medications:  .  atorvastatin (LIPITOR) 80 MG tablet, TAKE 1 TABLET(80 MG) BY MOUTH AT BEDTIME (Patient taking differently: Take 80 mg by mouth at bedtime.), Disp: 90 tablet, Rfl: 3 .  ezetimibe (ZETIA) 10 MG tablet, Take 1 tablet (10 mg total) by mouth at bedtime., Disp: , Rfl:  .  furosemide (LASIX) 20 MG tablet, Take by mouth., Disp: , Rfl:  .  Multiple Vitamin (MULTIVITAMIN) tablet, Take 1 tablet by mouth daily., Disp: , Rfl:  .  nicotine (NICODERM CQ - DOSED IN MG/24 HOURS) 14 mg/24hr patch, One 14mg  patch chest wall daily (okay to substitute generic), Disp: 28 patch, Rfl: 0 .  sodium chloride 1 g tablet, Take 1 tablet (1 g total) by mouth 2 (two) times daily with a meal., Disp: 60 tablet, Rfl: 0 .  aspirin 81 MG EC tablet, Take by mouth. (Patient not taking: Reported on 05/28/2020), Disp: , Rfl:  .  omeprazole (PRILOSEC) 20 MG capsule, Take by mouth. (Patient not taking: Reported on 05/28/2020), Disp: , Rfl:    Allergies  Allergen Reactions  . Sulfa  Antibiotics Hives     Past Surgical History:  Procedure Laterality Date  . COLONOSCOPY WITH PROPOFOL N/A 11/28/2017   Procedure: COLONOSCOPY WITH PROPOFOL;  Surgeon: Lin Landsman, MD;  [facility] END[facility]COPY;  Service: Gastroenterology;  Laterality: N/A;  . COLONOSCOPY WITH PROPOFOL N/A 05/22/2020   Procedure: COLONOSCOPY WITH PROPOFOL;  Surgeon: Lin Landsman, MD;  Location: [facility] ENDOSCOPY;  Service: Gastroenterology;  Laterality: N/A;  . INGUINAL HERNIA REPAIR Bilateral   . KNEE ARTHROSCOPY Bilateral   . TUBAL LIGATION       Family History  Problem Relation Age of Onset  . Alzheimer's disease Mother   . Breast cancer Mother   . Cancer Father        lung  . Dementia Sister   . Cancer Maternal Grandmother        breast  . Breast cancer Maternal Grandmother   . Heart attack Maternal Grandfather   . Healthy Sister   . Breast cancer Maternal Aunt   . Bladder Cancer Neg Hx   . Kidney cancer Neg Hx      Social History   Tobacco Use  . Smoking status: Current Some Day Smoker    Packs/day: 0.25    Years: 52.00    Pack years: 13.00    Types: Cigarettes    Last attempt to quit: 03/20/2018    Years since quitting: 2.1  . Smokeless tobacco: Never Used  . Tobacco comment: quit April 25, 2020  Substance Use Topics  . Alcohol use: Yes    Comment: socially    With staff assistance, above reviewed with the patient today.  ROS: As per HPI, otherwise no specific complaints on a limited and focused system review   No results found for this or any previous  visit (from the past 72 hour(s)).   PHQ2/9: Depression screen [facility] 2/9 05/28/2020 04/30/2020 04/24/2020 01/16/2020 11/16/2018  Decreased Interest 0 0 0 0 0  Down, Depressed, Hopeless 0 0 0 0 0  PHQ - 2 Score 0 0 0 0 0  Altered sleeping - - - - -  Tired, decreased energy - - - - -  Change in appetite - - - - -  Feeling bad or failure about yourself  - - - - -  Trouble concentrating - - - - -  Moving slowly or  fidgety/restless - - - - -  Suicidal thoughts - - - - -  PHQ-9 Score - - - - -  Difficult doing work/chores - - - - -   PHQ-2/9 Result is neg  Fall Risk: Fall Risk  05/28/2020 04/30/2020 04/24/2020 01/16/2020 11/16/2018  Falls in the past year? 0 1 0 0 0  Number falls in past yr: 0 1 0 0 0  Injury with Fall? 0 0 0 0 0  Risk for fall due to : - - No Fall Risks - -  Risk for fall due to: Comment - - - - -  Follow up - - Falls prevention discussed Falls evaluation completed Falls prevention discussed      Objective:   Vitals:   05/28/20 1355  BP: 132/68  Pulse: 86  Resp: 16  Temp: 98.6 F (37 C)  TempSrc: Oral  SpO2: 98%  Weight: 140 lb 11.2 oz (63.8 kg)  Height: 5\' 8"  (1.727 m)    Body mass index is 21.39 kg/m.  Physical Exam   NAD, masked,pleasant HEENT - [place]/AT, sclera anicteric, PERRL,+ glasses,EOMI, conj - non-inj'ed, pharynx clear Neck - supple, no adenopathy, no TM and no nodule palpated, carotids 2+ and = without bruits bilat Car - RRR without m/g/r Pulm- RR and effort normal at rest, CTA without wheeze or rales Abd - soft, NT diffusely,   Back - no CVA tenderness Ext - no LE edema,  Neuro/psychiatric - affect was not flat, appropriate with conversation Alert  Grossly non-focal - good strength on testing extremities, sensation intact to LT in distal extremities Speech  normal   Results for orders placed or performed during the hospital encounter of 05/20/20  SARS CORONAVIRUS 2 (TAT 6-24 HRS) Nasopharyngeal Nasopharyngeal Swab   Specimen: Nasopharyngeal Swab  Result Value Ref Range   SARS Coronavirus 2 NEGATIVE NEGATIVE       Assessment & Plan:    1. SIADH (syndrome of inappropriate ADH production) (HCC)/hyponatremia Diagnosed during her hospital admission, Has followed up with nephrology and continued follow-up important and has follow-up planned next week  The exact cause is still unclear.  Noted there are many things in the  differential, and nephrology is important to follow-up with to help [place] continuing the work-up for a potential source. She did have a pulmonary nodule, with one type of lung cancer, small cell cancer a known possible source of SIADH.  Did discuss potentially getting a pulmonary opinion on this, and it was encouraging that it had not increased significantly in size from previous.  May get pulmonary input in the very near future pending results of some studies ordered presently Noted there are many other potential causes  Continuing with the Lasix and fluid restriction as she is doing is important  2. Thyroid nodule Do feel getting ultrasound is appropriate, and one was ordered last visit.  Has not yet been scheduled.  3. Pulmonary nodule As noted  above.  May get pulmonary involvement or a PET scan in the near future as noted above.  4. Hyperlipidemia, familial, high LDL Continues on the statin product and Zetia product with last lipid panel reviewed.  5. Tobacco abuse Continues with smoking cessation and congratulated.  She is continuing the nicotine patches to help presently.  Talk with Marnette Burgess, our administrator to help with scheduling a follow-up for her breast study as well as the ultrasound of her thyroid. She has a follow-up appointment with nephrology next week planned  Tentatively, will arrange follow-up here in 4 weeks time, sooner as needed and she is aware that the follow-up will be with a new provider as I will be leaving this practice sooner than that planned follow-up.      Towanda Malkin, MD 05/28/20 1:55 PM

## 2020-05-28 ENCOUNTER — Ambulatory Visit (INDEPENDENT_AMBULATORY_CARE_PROVIDER_SITE_OTHER): Payer: Medicare Other | Admitting: Internal Medicine

## 2020-05-28 ENCOUNTER — Encounter: Payer: Self-pay | Admitting: Internal Medicine

## 2020-05-28 ENCOUNTER — Other Ambulatory Visit: Payer: Self-pay

## 2020-05-28 VITALS — BP 132/68 | HR 86 | Temp 98.6°F | Resp 16 | Ht 68.0 in | Wt 140.7 lb

## 2020-05-28 DIAGNOSIS — E041 Nontoxic single thyroid nodule: Secondary | ICD-10-CM | POA: Diagnosis not present

## 2020-05-28 DIAGNOSIS — R911 Solitary pulmonary nodule: Secondary | ICD-10-CM | POA: Diagnosis not present

## 2020-05-28 DIAGNOSIS — E7849 Other hyperlipidemia: Secondary | ICD-10-CM | POA: Diagnosis not present

## 2020-05-28 DIAGNOSIS — Z720 Tobacco use: Secondary | ICD-10-CM

## 2020-05-28 DIAGNOSIS — E222 Syndrome of inappropriate secretion of antidiuretic hormone: Secondary | ICD-10-CM | POA: Diagnosis not present

## 2020-05-28 DIAGNOSIS — E871 Hypo-osmolality and hyponatremia: Secondary | ICD-10-CM

## 2020-06-11 ENCOUNTER — Other Ambulatory Visit: Payer: Self-pay

## 2020-06-11 ENCOUNTER — Ambulatory Visit
Admission: RE | Admit: 2020-06-11 | Discharge: 2020-06-11 | Disposition: A | Discharge status: Home / Self Care | Payer: Medicare Other | Source: Ambulatory Visit | Attending: Internal Medicine | Admitting: Internal Medicine

## 2020-06-11 DIAGNOSIS — E041 Nontoxic single thyroid nodule: Secondary | ICD-10-CM | POA: Diagnosis present

## 2020-06-12 ENCOUNTER — Ambulatory Visit
Admission: RE | Admit: 2020-06-12 | Discharge: 2020-06-12 | Disposition: A | Discharge status: Home / Self Care | Payer: Medicare Other | Source: Ambulatory Visit | Attending: Internal Medicine | Admitting: Internal Medicine

## 2020-06-12 ENCOUNTER — Other Ambulatory Visit: Payer: Self-pay | Admitting: Internal Medicine

## 2020-06-12 ENCOUNTER — Other Ambulatory Visit: Payer: Self-pay | Admitting: Family Medicine

## 2020-06-12 DIAGNOSIS — R928 Other abnormal and inconclusive findings on diagnostic imaging of breast: Secondary | ICD-10-CM

## 2020-06-12 DIAGNOSIS — N6320 Unspecified lump in the left breast, unspecified quadrant: Secondary | ICD-10-CM | POA: Diagnosis present

## 2020-06-12 DIAGNOSIS — R921 Mammographic calcification found on diagnostic imaging of breast: Secondary | ICD-10-CM

## 2020-06-13 ENCOUNTER — Telehealth: Payer: Self-pay

## 2020-06-13 NOTE — Telephone Encounter (Signed)
Copied from [place] [id number]. Topic: General - Other >> Jun 12, 2020  4:16 PM Leward Quan A wrote: Reason for CRM: Samantha with [facility] called in to request orders for a biopsy from diagnostic mammogram

## 2020-06-25 ENCOUNTER — Ambulatory Visit
Admission: RE | Admit: 2020-06-25 | Discharge: 2020-06-25 | Disposition: A | Discharge status: Home / Self Care | Payer: Medicare Other | Source: Ambulatory Visit | Attending: Family Medicine | Admitting: Family Medicine

## 2020-06-25 ENCOUNTER — Other Ambulatory Visit: Payer: Self-pay

## 2020-06-25 DIAGNOSIS — R928 Other abnormal and inconclusive findings on diagnostic imaging of breast: Secondary | ICD-10-CM

## 2020-06-25 DIAGNOSIS — R921 Mammographic calcification found on diagnostic imaging of breast: Secondary | ICD-10-CM | POA: Insufficient documentation

## 2020-06-25 HISTORY — PX: BREAST BIOPSY: SHX20

## 2020-06-26 LAB — SURGICAL PATHOLOGY

## 2020-07-09 ENCOUNTER — Telehealth: Payer: Self-pay

## 2020-07-09 DIAGNOSIS — Z122 Encounter for screening for malignant neoplasm of respiratory organs: Secondary | ICD-10-CM

## 2020-07-09 DIAGNOSIS — Z87891 Personal history of nicotine dependence: Secondary | ICD-10-CM

## 2020-07-10 NOTE — Telephone Encounter (Signed)
Former smoker, quit 04/25/20, 34.5 pack year history.

## 2020-07-14 ENCOUNTER — Other Ambulatory Visit: Payer: Self-pay

## 2020-07-14 ENCOUNTER — Ambulatory Visit
Admission: RE | Admit: 2020-07-14 | Discharge: 2020-07-14 | Disposition: A | Discharge status: Home / Self Care | Payer: Medicare Other | Source: Ambulatory Visit | Attending: Oncology | Admitting: Oncology

## 2020-07-14 DIAGNOSIS — Z87891 Personal history of nicotine dependence: Secondary | ICD-10-CM | POA: Diagnosis not present

## 2020-07-14 DIAGNOSIS — Z122 Encounter for screening for malignant neoplasm of respiratory organs: Secondary | ICD-10-CM | POA: Diagnosis present

## 2020-07-16 ENCOUNTER — Ambulatory Visit: Admission: RE | Admit: 2020-07-16 | Payer: Medicare Other | Source: Ambulatory Visit

## 2020-07-16 ENCOUNTER — Telehealth: Payer: Self-pay | Admitting: *Deleted

## 2020-07-16 NOTE — Telephone Encounter (Signed)
Patient had scan on Monday 07/14/2020.

## 2020-07-21 ENCOUNTER — Encounter: Payer: Self-pay | Admitting: *Deleted

## 2020-10-20 ENCOUNTER — Other Ambulatory Visit: Payer: Self-pay | Admitting: Family Medicine

## 2020-10-20 NOTE — Telephone Encounter (Signed)
Medication Refill - Medication:  1.) ezetimibe (ZETIA) 10 MG tablet 2.)  atorvastatin (LIPITOR) 80 MG tablet   Has the patient contacted their pharmacy? Yes, pt states pharmacy told her to contact PCP  Preferred Pharmacy (with phone number or street name):  [id number] [address], [place] Phone:  [phone]  Fax:  [phone]       Agent: Please be advised that RX refills may take up to 3 business days. We ask that you follow-up with your pharmacy.

## 2020-10-20 NOTE — Telephone Encounter (Signed)
Last seen 1.12.2022 next sch'd 7.6.2022

## 2020-10-20 NOTE — Telephone Encounter (Signed)
Notes to clinic: script requested have expired Review for continued use and refill    Requested Prescriptions  Pending Prescriptions Disp Refills   atorvastatin (LIPITOR) 80 MG tablet 90 tablet 3    Sig: TAKE 1 TABLET(80 MG) BY MOUTH AT BEDTIME      Cardiovascular:  Antilipid - Statins Failed - 10/20/2020 11:33 AM      Failed - HDL in normal range and within 360 days    HDL  Date Value Ref Range Status  01/16/2020 40 (L) > OR = 50 mg/dL Final          Passed - Total Cholesterol in normal range and within 360 days    Cholesterol  Date Value Ref Range Status  01/16/2020 127 <200 mg/dL Final          Passed - LDL in normal range and within 360 days    LDL Cholesterol (Calc)  Date Value Ref Range Status  01/16/2020 73 mg/dL (calc) Final    Comment:    Reference range: <100 . Desirable range <100 mg/dL for primary prevention;   <70 mg/dL for patients with CHD or diabetic patients  with > or = 2 CHD risk factors. Marland Kitchen LDL-C is now calculated using the Martin-Hopkins  calculation, which is a validated novel method providing  better accuracy than the Friedewald equation in the  estimation of LDL-C.  Cresenciano Genre et al. Annamaria Helling. 1962;229(79): 2061-2068  (http://education.[employer].com/faq/FAQ164)           Passed - Triglycerides in normal range and within 360 days    Triglycerides  Date Value Ref Range Status  01/16/2020 65 <150 mg/dL Final          Passed - Patient is not pregnant      Passed - Valid encounter within last 12 months    Recent Outpatient Visits           4 months ago SIADH (syndrome of inappropriate ADH production) [facility])   [facility] Lebron Conners D, MD   5 months ago Encounter for examination following treatment at hospital   [place], MD   9 months ago Polyp of colon, unspecified part of colon, unspecified type   [place], MD    2 years ago Coronary artery disease involving native coronary artery of native heart without angina pectoris   [place], Satira Anis, MD   2 years ago Hyperlipidemia, familial, high LDL   [place], Satira Anis, MD       Future Appointments             In 1 month Delsa Grana, PA-C [facility], [place]   In 6 months  [facility]               ezetimibe (ZETIA) 10 MG tablet      Sig: Take 1 tablet (10 mg total) by mouth at bedtime.      Cardiovascular:  Antilipid - Sterol Transport Inhibitors Failed - 10/20/2020 11:33 AM      Failed - HDL in normal range and within 360 days    HDL  Date Value Ref Range Status  01/16/2020 40 (L) > OR = 50 mg/dL Final          Passed - Total Cholesterol in normal range and within 360 days    Cholesterol  Date Value Ref Range Status  01/16/2020 127 <200 mg/dL Final          Passed - LDL in normal range and within 360 days    LDL Cholesterol (Calc)  Date Value Ref Range Status  01/16/2020 73 mg/dL (calc) Final    Comment:    Reference range: <100 . Desirable range <100 mg/dL for primary prevention;   <70 mg/dL for patients with CHD or diabetic patients  with > or = 2 CHD risk factors. Marland Kitchen LDL-C is now calculated using the Martin-Hopkins  calculation, which is a validated novel method providing  better accuracy than the Friedewald equation in the  estimation of LDL-C.  Cresenciano Genre et al. Annamaria Helling. 1779;390(30): 2061-2068  (http://education.[facility].com/faq/FAQ164)           Passed - Triglycerides in normal range and within 360 days    Triglycerides  Date Value Ref Range Status  01/16/2020 65 <150 mg/dL Final          Passed - Valid encounter within last 12 months    Recent Outpatient Visits           4 months ago SIADH (syndrome of inappropriate ADH production) [facility])   [facility] [place] Malkin, MD    5 months ago Encounter for examination following treatment at hospital   [place], MD   9 months ago Polyp of colon, unspecified part of colon, unspecified type   [place], MD   2 years ago Coronary artery disease involving native coronary artery of native heart without angina pectoris   [place], Satira Anis, MD   2 years ago Hyperlipidemia, familial, high LDL   [place], Satira Anis, MD       Future Appointments             In 1 month Delsa Grana, PA-C [facility], [place]   In 6 months  [facility], [facility]

## 2020-10-22 MED ORDER — ATORVASTATIN CALCIUM 80 MG PO TABS: ORAL_TABLET | ORAL | 0 refills | Status: DC

## 2020-10-22 MED ORDER — EZETIMIBE 10 MG PO TABS: 10.0000 mg | ORAL_TABLET | Freq: Every day | ORAL | 0 refills | Status: DC

## 2020-10-23 ENCOUNTER — Telehealth: Payer: Self-pay | Admitting: Internal Medicine

## 2020-10-23 ENCOUNTER — Telehealth: Payer: Self-pay

## 2020-10-23 NOTE — Telephone Encounter (Signed)
Both sent yesterday

## 2020-10-23 NOTE — Telephone Encounter (Signed)
Copied from [place] [id number]. Topic: General - Inquiry >> Oct 23, 2020 10:10 AM Greggory Keen D wrote: Reason for CRM:  Pt called saying she does not need the 80 mg of Atorvastatin  she needs the 10 mg of the ezetimibe.  She said there is confusion at the pharmacy of which prescription she needs.

## 2020-11-19 ENCOUNTER — Other Ambulatory Visit: Payer: Self-pay

## 2020-11-19 ENCOUNTER — Encounter: Payer: Self-pay | Admitting: Family Medicine

## 2020-11-19 ENCOUNTER — Ambulatory Visit (INDEPENDENT_AMBULATORY_CARE_PROVIDER_SITE_OTHER): Payer: Medicare Other | Admitting: Family Medicine

## 2020-11-19 VITALS — BP 136/78 | HR 92 | Temp 98.4°F | Resp 16 | Ht 68.0 in | Wt 150.6 lb

## 2020-11-19 DIAGNOSIS — E7849 Other hyperlipidemia: Secondary | ICD-10-CM

## 2020-11-19 DIAGNOSIS — I700 Atherosclerosis of aorta: Secondary | ICD-10-CM | POA: Diagnosis not present

## 2020-11-19 DIAGNOSIS — Z5181 Encounter for therapeutic drug level monitoring: Secondary | ICD-10-CM

## 2020-11-19 DIAGNOSIS — R911 Solitary pulmonary nodule: Secondary | ICD-10-CM | POA: Diagnosis not present

## 2020-11-19 DIAGNOSIS — E222 Syndrome of inappropriate secretion of antidiuretic hormone: Secondary | ICD-10-CM

## 2020-11-19 DIAGNOSIS — Z780 Asymptomatic menopausal state: Secondary | ICD-10-CM

## 2020-11-19 DIAGNOSIS — Z23 Encounter for immunization: Secondary | ICD-10-CM

## 2020-11-19 DIAGNOSIS — K7460 Unspecified cirrhosis of liver: Secondary | ICD-10-CM

## 2020-11-19 DIAGNOSIS — N6320 Unspecified lump in the left breast, unspecified quadrant: Secondary | ICD-10-CM

## 2020-11-19 DIAGNOSIS — J432 Centrilobular emphysema: Secondary | ICD-10-CM

## 2020-11-19 DIAGNOSIS — M8580 Other specified disorders of bone density and structure, unspecified site: Secondary | ICD-10-CM | POA: Diagnosis not present

## 2020-11-19 LAB — LIPID PANEL
Cholesterol: 119 mg/dL (ref <200)
HDL: 41 mg/dL — ABNORMAL LOW (ref >=50)
LDL Cholesterol (Calc): 64 mg/dL (calc)
Non-HDL Cholesterol (Calc): 78 mg/dL (calc) (ref <130)
Total CHOL/HDL Ratio: 2.9 (calc) (ref <5.0)
Triglycerides: 65 mg/dL (ref <150)

## 2020-11-19 MED ORDER — ZOSTER VAC RECOMB ADJUVANTED 50 MCG/0.5ML IM SUSR: 0.5000 mL | Freq: Once | INTRAMUSCULAR | 1 refills | Status: AC

## 2020-11-19 MED ORDER — ATORVASTATIN CALCIUM 80 MG PO TABS: ORAL_TABLET | ORAL | 3 refills | Status: DC

## 2020-11-19 MED ORDER — EZETIMIBE 10 MG PO TABS: 10.0000 mg | ORAL_TABLET | Freq: Every day | ORAL | 3 refills | Status: DC

## 2020-11-19 NOTE — Progress Notes (Signed)
Name: Julie Beck   MRN: [id number]    DOB: 10/19/1945   Date:11/19/2020       Progress Note  Chief Complaint  Patient presents with   Follow-up     Subjective:   Julie Beck is a 75 y.o. female, presents to clinic for routine f/up and to est with provider in clinic with PCP moving out of the area  Pt last appt in clinic here was Jan for HFU  Hyperlipidemia: Hx of CAD/thoracic aortic atherosclerosis Currently treated with atorvastatin 80 and zetia, pt reports good med compliance Last Lipids: Lab Results  Component Value Date   CHOL 127 01/16/2020   HDL 40 (L) 01/16/2020   LDLCALC 73 01/16/2020   TRIG 65 01/16/2020   CHOLHDL 3.2 01/16/2020   - Denies: Chest pain, shortness of breath, myalgias, claudication  Pulm nodule -  Following with imaging and lung nodule team, last CT was 3/22   SIADH - managed by nephrology, last OV March 2022 and labs reviewed On lasix, fluid restriction, also salt supplement BID  Impression/Recommendations   Julie Beck is a 75 year old female with a history of athritis, tobacco abuse, hyperlipidemia, emphysema and coronary artery disease who presents as a hospital follow up for SIADH.   1. SIADH  - patient's renal function from [facility] was >60, creatinine 0.61.  - reviewed labs from [facility]: Osmolality 262, Urine osmolality 527, urine sodium 65, K 3.7, TSH 2.7,  - No history of thiazide diuretics. No medication changes prior to hospitilization  - encouraged patient to follow fluid restriction: 32 ounces of fluid total. We discussed treatments for dry mouth.  - continue lasix 20mg  qd  - obtain updated renal fxn panel  - will need to discuss with patient regarding obtaining cortisol and aldosterone/renin ratio prior to next appointment to r/o adrenal insufficiency. Patient will need to come early in the morning for these labs.   2. Elevated blood pressure  - encouraged patient to check blood pressure at home. Provided patient with bp log. Will  wait on starting antihypertensive until I can get a home medication list.  - she has been start on blood pressure medication before that she did not tolerate.  Breast Mass needing 6 month f/up, due for diagnostic mammo f/up in Aug Family hx of breast CA  Dexa - due, reviewed vit D and calcium supplementation   Thyroid nodules seen on CT, f/up US and labs TSH normal US showed bilateral nodules not needing f/up or FNA     Current Outpatient Medications:    aspirin 81 MG EC tablet, Take by mouth., Disp: , Rfl:    furosemide (LASIX) 20 MG tablet, Take by mouth., Disp: , Rfl:    Multiple Vitamin (MULTIVITAMIN) tablet, Take 1 tablet by mouth daily., Disp: , Rfl:    omeprazole (PRILOSEC) 20 MG capsule, Take by mouth., Disp: , Rfl:    sodium chloride 1 g tablet, Take 1 tablet (1 g total) by mouth 2 (two) times daily with a meal., Disp: 60 tablet, Rfl: 0   Zoster Vaccine Adjuvanted (SHINGRIX) injection, Inject 0.5 mLs into the muscle once for 1 dose. And repeat once more in 2-6 months, Disp: 0.5 mL, Rfl: 1   atorvastatin (LIPITOR) 80 MG tablet, TAKE 1 TABLET(80 MG) BY MOUTH AT BEDTIME, Disp: 90 tablet, Rfl: 3   ezetimibe (ZETIA) 10 MG tablet, Take 1 tablet (10 mg total) by mouth at bedtime., Disp: 90 tablet, Rfl: 3  Patient Active Problem List  Diagnosis Date Noted   History of colonic polyps    Hypo-osmolality and hyponatremia 05/07/2020   SIADH (syndrome of inappropriate ADH production) ([place])    Pulmonary nodule    Thyroid nodule    Osteopenia 01/16/2020   Cirrhosis of liver ([place]) 07/20/2016   Centrilobular emphysema ([place]) 07/12/2016   Thoracic aortic atherosclerosis ([place]) 07/12/2016   Coronary artery disease 07/12/2016   Personal history of tobacco use, presenting hazards to health 06/11/2016   Hyperlipidemia 03/18/2016   Abnormal EKG 03/17/2016   GERD (gastroesophageal reflux disease) 03/17/2016   Overweight (BMI 25.0-29.9) 03/17/2016    Past Surgical History:  Procedure  Laterality Date   BREAST BIOPSY Right 06/25/2020   affirm bx calc, coil marker, path pending   COLONOSCOPY WITH PROPOFOL N/A 11/28/2017   Procedure: COLONOSCOPY WITH PROPOFOL;  Surgeon: Lin Landsman, MD;  Location: [facility];  Service: Gastroenterology;  Laterality: N/A;   COLONOSCOPY WITH PROPOFOL N/A 05/22/2020   Procedure: COLONOSCOPY WITH PROPOFOL;  Surgeon: Lin Landsman, MD;  Location: [facility];  Service: Gastroenterology;  Laterality: N/A;   INGUINAL HERNIA REPAIR Bilateral    KNEE ARTHROSCOPY Bilateral    TUBAL LIGATION      Family History  Problem Relation Age of Onset   Alzheimer's disease Mother    Breast cancer Mother    Cancer Father        lung   Dementia Sister    Cancer Maternal Grandmother        breast   Breast cancer Maternal Grandmother    Heart attack Maternal Grandfather    Healthy Sister    Breast cancer Maternal Aunt    Bladder Cancer Neg Hx    Kidney cancer Neg Hx     Social History   Tobacco Use   Smoking status: Former    Packs/day: 1.00    Years: 52.00    Pack years: 52.00    Types: Cigarettes    Quit date: 04/26/2019    Years since quitting: 1.5   Smokeless tobacco: Never   Tobacco comments:    quit April 25, 2020 most years 1 ppd, and more recently 1/2 ppd, roughly 40-45 pack years  Vaping Use   Vaping Use: Never used  Substance Use Topics   Alcohol use: Yes    Comment: socially   Drug use: No     Allergies  Allergen Reactions   Sulfa Antibiotics Hives    Health Maintenance  Topic Date Due   COVID-19 Vaccine (3 - Booster for Pfizer series) 01/02/2020   Zoster Vaccines- Shingrix (1 of 2) 02/19/2021 (Originally 06/18/1997)   DEXA SCAN  11/19/2021 (Originally 04/22/2016)   PNA vac Low Risk Adult (2 of 2 - PPSV23) 11/19/2021 (Originally 05/30/2015)   INFLUENZA VACCINE  12/15/2020   MAMMOGRAM  06/12/2021   TETANUS/TDAP  06/23/2024   COLONOSCOPY (Pts 45-26yrs Insurance coverage will need to be confirmed)   05/22/2025   Hepatitis C Screening  Completed   HPV VACCINES  Aged Out    Chart Review Today: I personally reviewed active problem list, medication list, allergies, family history, social history, health maintenance, notes from last encounter, lab results, imaging with the patient/caregiver today.'  Review of Systems  Constitutional: Negative.   HENT: Negative.    Eyes: Negative.   Respiratory: Negative.    Cardiovascular: Negative.   Gastrointestinal: Negative.   Endocrine: Negative.   Genitourinary: Negative.   Musculoskeletal: Negative.   Skin: Negative.   Allergic/Immunologic: Negative.   Neurological: Negative.  Hematological: Negative.   Psychiatric/Behavioral: Negative.    All other systems reviewed and are negative.   Objective:   Vitals:   11/19/20 1421  BP: 136/78  Pulse: 92  Resp: 16  Temp: 98.4 F (36.9 C)  SpO2: 98%  Weight: 150 lb 9.6 oz (68.3 kg)  Height: 5\' 8"  (1.727 m)    Body mass index is 22.9 kg/m.  Physical Exam Vitals and nursing note reviewed.  Constitutional:      General: She is not in acute distress.    Appearance: Normal appearance. She is well-developed and normal weight. She is not ill-appearing, toxic-appearing or diaphoretic.     Interventions: Face mask in place.  HENT:     Head: Normocephalic and atraumatic.     Right Ear: External ear normal.     Left Ear: External ear normal.  Eyes:     General: Lids are normal. No scleral icterus.       Right eye: No discharge.        Left eye: No discharge.     Conjunctiva/sclera: Conjunctivae normal.  Neck:     Trachea: Phonation normal. No tracheal deviation.  Cardiovascular:     Rate and Rhythm: Normal rate and regular rhythm.     Pulses: Normal pulses.          Radial pulses are 2+ on the right side and 2+ on the left side.       Posterior tibial pulses are 2+ on the right side and 2+ on the left side.     Heart sounds: Normal heart sounds. No murmur heard.   No friction rub.  No gallop.  Pulmonary:     Effort: Pulmonary effort is normal. No respiratory distress.     Breath sounds: Normal breath sounds. No stridor. No wheezing, rhonchi or rales.  Chest:     Chest wall: No tenderness.  Abdominal:     General: Bowel sounds are normal. There is no distension.     Palpations: Abdomen is soft.  Musculoskeletal:     Right lower leg: No edema.     Left lower leg: No edema.  Skin:    General: Skin is warm and dry.     Coloration: Skin is not jaundiced or pale.     Findings: No rash.  Neurological:     Mental Status: She is alert.     Motor: No abnormal muscle tone.     Gait: Gait normal.  Psychiatric:        Mood and Affect: Mood normal.        Speech: Speech normal.        Behavior: Behavior normal.        Assessment & Plan:     ICD-10-CM   1. Hyperlipidemia, familial, high LDL  E78.49 ezetimibe (ZETIA) 10 MG tablet    atorvastatin (LIPITOR) 80 MG tablet    Lipid panel   last labs reviewed, LDL at goal, good med compliance, due for lipids in the next couple months, CMP done by nephrology today    2. Thoracic aortic atherosclerosis (HCC)  I70.0 Lipid panel   on statin and zetia, monitoring    3. Osteopenia, unspecified location  M85.80    discussed supplementation and preventative measures, dexa ordered    4. Pulmonary nodule  R91.1    reviewed imaging, continue with CT monitoring    5. SIADH (syndrome of inappropriate ADH production) ([place])  E22.2    per nephrology, pt very compliant  6. Medication monitoring encounter  Z51.81 Lipid panel    7. Centrilobular emphysema ([place])  J43.2    annual CT monitoring, no current respiratory sx    8. Hepatic cirrhosis, unspecified hepatic cirrhosis type, unspecified whether ascites present ([place])  K74.60    will monitor LFTs, labs done today per nephro    9. Left breast mass  N63.20 MM Digital Diagnostic Unilat L   6 month f/up, diagnostic L mammo ordered today, +/- [place]    10. Need for shingles  vaccine  Z23     11. Postmenopausal estrogen deficiency  Z78.0 DG Bone Density      Return in about 1 year (around 11/19/2021) for Routine follow-up.   Delsa Grana, PA-C 11/19/20 4:22 PM

## 2020-11-19 NOTE — Patient Instructions (Signed)
[facility] at [facility] [place],  [place]  [address] Get Driving Directions Main: [phone]  Call to schedule your follow up mammogram and bone density test  Preventing Osteoporosis, Adult Osteoporosis is a condition that causes the bones to lose density. This means that the bones become thinner, and the normal spaces in bone tissue become larger. Low bone density can make the bones weak and cause them to break moreeasily. Osteoporosis cannot always be prevented, but you can take steps to lower yourrisk of developing this condition. How can this condition affect me? If you develop osteoporosis, you will be more likely to break bones in your wrist, spine, or hip. Even a minor accident or injury can be enough to break weak bones. The bones will also be slower to heal. Osteoporosis can cause otherproblems as well, such as a stooped posture or trouble with movement. Osteoporosis can occur with aging. As you get older, you may lose bone tissue more quickly, or it may be replaced more slowly. Osteoporosis is more likely to develop if you have poor nutrition or do not get enough calcium or vitamin D. Other lifestyle factors can also play a role. By eating a well-balanced diet and making lifestyle changes, you can help keep your bones strong and healthy,lowering your chances of developing osteoporosis. What can increase my risk? The following factors may make you more likely to develop osteoporosis: Having a family history of the condition. Having poor nutrition or not getting enough calcium or vitamin D. Using certain medicines, such as steroid medicines or anti-seizure medicines. Being any of the following: 31 years of age or older. Female. A woman who has gone through menopause (is postmenopausal). A person who is of European or Asian descent. Using products that contain nicotine or tobacco, such as cigarettes, e-cigarettes, and chewing tobacco. Not  being physically active (being sedentary). Having a small body frame. What actions can I take to prevent this? Get enough calcium  Make sure you get enough calcium every day. Calcium is the most important mineral for bone health. Most people can get enough calcium from their diet, but supplements may be recommended for people who are at risk for osteoporosis. Follow these guidelines: If you are age 76 or younger, aim to get 1,000 milligrams (mg) of calcium every day. If you are older than age 1, aim to get 1,200 mg of calcium every day. Good sources of calcium include: Dairy products, such as low-fat or nonfat milk, cheese, and yogurt. Dark green leafy vegetables, such as bok choy and broccoli. Foods that have had calcium added to them (calcium-fortified foods), such as orange juice, cereal, bread, soy beverages, and tofu products. Nuts, such as almonds. Check nutrition labels to see how much calcium is in a food or drink.  Get enough vitamin D Try to get enough vitamin D every day. Vitamin D is the most essential vitamin for bone health. It helps the body absorb calcium. Follow these guidelines for how much vitamin D to get from food: If you are age 65 or younger, aim to get at least 600 international units (IU) every day. Your health care provider may suggest more. If you are older than age 49, aim to get at least 800 international units every day. Your health care provider may suggest more. Good sources of vitamin D in your diet include: Egg yolks. Oily fish, such as salmon, sardines, and tuna. Milk and cereal fortified with vitamin D. Your body also  makes vitamin D when you are out in the sun. Exposing the bare skin on your face, arms, legs, or back to the sun for no more than 30 minutes a day, 2 times a week is more than enough. Beyond that, make sure you use sunblock to protect your skin from sunburn, which increases your risk for skin cancer. Exercise  Stay active and get exercise  every day. Ask your health care provider what types of exercise are best for you. Weight-bearing and strength-building activities are important for building and maintaining healthy bones. Some examples of these types of activities include: Walking and hiking. Jogging and running. Dancing. Gym exercises and lifting weights. Tennis and racquetball. Climbing stairs. Tai chi.  Make other lifestyle changes Do not use any products that contain nicotine or tobacco, such as cigarettes, e-cigarettes, and chewing tobacco. If you need help quitting, ask your health care provider. Lose weight if you are overweight. If you drink alcohol: Limit how much you use to: 0-1 drink a day for women who are not pregnant. 0-2 drinks a day for men. Be aware of how much alcohol is in your drink. In the [place] one drink equals one 12 oz bottle of beer (355 mL), one 5 oz glass of wine (148 mL), or one 1 oz glass of hard liquor (44 mL). Where to find support If you need help making changes to prevent osteoporosis, talk with your health care provider. You can ask for a referral to a dietitian and a physicaltherapist. Where to find more information Learn more about osteoporosis from: NIH Osteoporosis and Related [place]: www.bones.SouthExposed.es [place]. Office on [employer] Health: [place].tn [place]: EquipmentWeekly.com.ee Summary Osteoporosis is a condition that causes weak bones that are more likely to break. Eat a healthy diet, making sure you get enough calcium and vitamin D, and stay active by getting regular exercise to help prevent osteoporosis. Other ways to reduce your risk of osteoporosis include maintaining a healthy weight and avoiding alcohol and products that contain nicotine or tobacco. This information is not intended to replace advice given to you by your health care provider. Make sure you discuss any questions you have with your healthcare  provider. Document Revised: 10/18/2019 Document Reviewed: 10/18/2019 Elsevier Patient Education  [place].

## 2020-11-25 ENCOUNTER — Other Ambulatory Visit: Payer: Self-pay | Admitting: Family Medicine

## 2020-11-25 DIAGNOSIS — N6459 Other signs and symptoms in breast: Secondary | ICD-10-CM

## 2020-11-25 DIAGNOSIS — N6320 Unspecified lump in the left breast, unspecified quadrant: Secondary | ICD-10-CM

## 2021-01-15 ENCOUNTER — Ambulatory Visit: Payer: Medicare Other | Admitting: Internal Medicine

## 2021-04-28 ENCOUNTER — Ambulatory Visit: Payer: Medicare Other

## 2021-05-14 ENCOUNTER — Ambulatory Visit: Payer: Medicare Other

## 2021-05-21 ENCOUNTER — Other Ambulatory Visit: Payer: Self-pay

## 2021-05-21 ENCOUNTER — Ambulatory Visit (INDEPENDENT_AMBULATORY_CARE_PROVIDER_SITE_OTHER): Payer: Medicare Other

## 2021-05-21 VITALS — BP 144/72 | HR 98 | Temp 98.2°F | Resp 16 | Ht 68.0 in | Wt 156.6 lb

## 2021-05-21 DIAGNOSIS — N6459 Other signs and symptoms in breast: Secondary | ICD-10-CM | POA: Diagnosis not present

## 2021-05-21 DIAGNOSIS — N6320 Unspecified lump in the left breast, unspecified quadrant: Secondary | ICD-10-CM | POA: Diagnosis not present

## 2021-05-21 DIAGNOSIS — Z0000 Encounter for general adult medical examination without abnormal findings: Secondary | ICD-10-CM | POA: Diagnosis not present

## 2021-05-21 DIAGNOSIS — Z780 Asymptomatic menopausal state: Secondary | ICD-10-CM

## 2021-05-21 NOTE — Patient Instructions (Signed)
Julie Beck , Thank you for taking time to come for your Medicare Wellness Visit. I appreciate your ongoing commitment to your health goals. Please review the following plan we discussed and let me know if I can assist you in the future.   Screening recommendations/referrals: Colonoscopy: done 05/22/20; repeat 05/2025 Mammogram: done 06/25/20. Please call [phone] to schedule your mammogram and bone density screening.  Bone Density: done 04/22/14 Recommended yearly ophthalmology/optometry visit for glaucoma screening and checkup Recommended yearly dental visit for hygiene and checkup  Vaccinations: Influenza vaccine: due Pneumococcal vaccine: done 05/29/14; due for Prevnar20 Tdap vaccine: done 06/23/14 Shingles vaccine: Shingrix discussed. Please contact your pharmacy for coverage information.  Covid-19:done 07/12/19 & 08/02/19  Advanced directives: Please bring a copy of your health care power of attorney and living will to the office at your convenience.   Conditions/risks identified: Keep up the great work!  Next appointment: Follow up in one year for your annual wellness visit    Preventive Care 74 Years and Older, Female Preventive care refers to lifestyle choices and visits with your health care provider that can promote health and wellness. What does preventive care include? A yearly physical exam. This is also called an annual well check. Dental exams once or twice a year. Routine eye exams. Ask your health care provider how often you should have your eyes checked. Personal lifestyle choices, including: Daily care of your teeth and gums. Regular physical activity. Eating a healthy diet. Avoiding tobacco and drug use. Limiting alcohol use. Practicing safe sex. Taking low-dose aspirin every day. Taking vitamin and mineral supplements as recommended by your health care provider. What happens during an annual well check? The services and screenings done by your health care provider  during your annual well check will depend on your age, overall health, lifestyle risk factors, and family history of disease. Counseling  Your health care provider may ask you questions about your: Alcohol use. Tobacco use. Drug use. Emotional well-being. Home and relationship well-being. Sexual activity. Eating habits. History of falls. Memory and ability to understand (cognition). Work and work Statistician. Reproductive health. Screening  You may have the following tests or measurements: Height, weight, and BMI. Blood pressure. Lipid and cholesterol levels. These may be checked every 5 years, or more frequently if you are over 74 years old. Skin check. Lung cancer screening. You may have this screening every year starting at age 74 if you have a 30-pack-year history of smoking and currently smoke or have quit within the past 15 years. Fecal occult blood test (FOBT) of the stool. You may have this test every year starting at age 74. Flexible sigmoidoscopy or colonoscopy. You may have a sigmoidoscopy every 5 years or a colonoscopy every 10 years starting at age 74. Hepatitis C blood test. Hepatitis B blood test. Sexually transmitted disease (STD) testing. Diabetes screening. This is done by checking your blood sugar (glucose) after you have not eaten for a while (fasting). You may have this done every 1-3 years. Bone density scan. This is done to screen for osteoporosis. You may have this done starting at age 74. Mammogram. This may be done every 1-2 years. Talk to your health care provider about how often you should have regular mammograms. Talk with your health care provider about your test results, treatment options, and if necessary, the need for more tests. Vaccines  Your health care provider may recommend certain vaccines, such as: Influenza vaccine. This is recommended every year. Tetanus, diphtheria, and acellular pertussis (  Tdap, Td) vaccine. You may need a Td booster every  10 years. Zoster vaccine. You may need this after age 74. Pneumococcal 13-valent conjugate (PCV13) vaccine. One dose is recommended after age 74. Pneumococcal polysaccharide (PPSV23) vaccine. One dose is recommended after age 74. Talk to your health care provider about which screenings and vaccines you need and how often you need them. This information is not intended to replace advice given to you by your health care provider. Make sure you discuss any questions you have with your health care provider. Document Released: 05/30/2015 Document Revised: 01/21/2016 Document Reviewed: 03/04/2015 Elsevier Interactive Patient Education  2017 [place] Prevention in the Home Falls can cause injuries. They can happen to people of all ages. There are many things you can do to make your home safe and to help prevent falls. What can I do on the outside of my home? Regularly fix the edges of walkways and driveways and fix any cracks. Remove anything that might make you trip as you walk through a door, such as a raised step or threshold. Trim any bushes or trees on the path to your home. Use bright outdoor lighting. Clear any walking paths of anything that might make someone trip, such as rocks or tools. Regularly check to see if handrails are loose or broken. Make sure that both sides of any steps have handrails. Any raised decks and porches should have guardrails on the edges. Have any leaves, snow, or ice cleared regularly. Use sand or salt on walking paths during winter. Clean up any spills in your garage right away. This includes oil or grease spills. What can I do in the bathroom? Use night lights. Install grab bars by the toilet and in the tub and shower. Do not use towel bars as grab bars. Use non-skid mats or decals in the tub or shower. If you need to sit down in the shower, use a plastic, non-slip stool. Keep the floor dry. Clean up any water that spills on the floor as soon as it  happens. Remove soap buildup in the tub or shower regularly. Attach bath mats securely with double-sided non-slip rug tape. Do not have throw rugs and other things on the floor that can make you trip. What can I do in the bedroom? Use night lights. Make sure that you have a light by your bed that is easy to reach. Do not use any sheets or blankets that are too big for your bed. They should not hang down onto the floor. Have a firm chair that has side arms. You can use this for support while you get dressed. Do not have throw rugs and other things on the floor that can make you trip. What can I do in the kitchen? Clean up any spills right away. Avoid walking on wet floors. Keep items that you use a lot in easy-to-reach places. If you need to reach something above you, use a strong step stool that has a grab bar. Keep electrical cords out of the way. Do not use floor polish or wax that makes floors slippery. If you must use wax, use non-skid floor wax. Do not have throw rugs and other things on the floor that can make you trip. What can I do with my stairs? Do not leave any items on the stairs. Make sure that there are handrails on both sides of the stairs and use them. Fix handrails that are broken or loose. Make sure that handrails are  as long as the stairways. Check any carpeting to make sure that it is firmly attached to the stairs. Fix any carpet that is loose or worn. Avoid having throw rugs at the top or bottom of the stairs. If you do have throw rugs, attach them to the floor with carpet tape. Make sure that you have a light switch at the top of the stairs and the bottom of the stairs. If you do not have them, ask someone to add them for you. What else can I do to help prevent falls? Wear shoes that: Do not have high heels. Have rubber bottoms. Are comfortable and fit you well. Are closed at the toe. Do not wear sandals. If you use a stepladder: Make sure that it is fully opened.  Do not climb a closed stepladder. Make sure that both sides of the stepladder are locked into place. Ask someone to hold it for you, if possible. Clearly mark and make sure that you can see: Any grab bars or handrails. First and last steps. Where the edge of each step is. Use tools that help you move around (mobility aids) if they are needed. These include: Canes. Walkers. Scooters. Crutches. Turn on the lights when you go into a dark area. Replace any light bulbs as soon as they burn out. Set up your furniture so you have a clear path. Avoid moving your furniture around. If any of your floors are uneven, fix them. If there are any pets around you, be aware of where they are. Review your medicines with your doctor. Some medicines can make you feel dizzy. This can increase your chance of falling. Ask your doctor what other things that you can do to help prevent falls. This information is not intended to replace advice given to you by your health care provider. Make sure you discuss any questions you have with your health care provider. Document Released: 02/27/2009 Document Revised: 10/09/2015 Document Reviewed: 06/07/2014 Elsevier Interactive Patient Education  2017 [employer].

## 2021-05-21 NOTE — Progress Notes (Signed)
Subjective:   Julie Beck is a 76 y.o. female who presents for Medicare Annual (Subsequent) preventive examination.  Review of Systems     Cardiac Risk Factors include: advanced age (>37men, >52 women);dyslipidemia     Objective:    Today's Vitals   05/21/21 0824 05/21/21 0825  BP: (!) 144/72   Pulse: 98   Resp: 16   Temp: 98.2 F (36.8 C)   TempSrc: Oral   SpO2: 96%   Weight: 156 lb 9.6 oz (71 kg)   Height: 5\' 8"  (1.727 m)   PainSc:  7    Body mass index is 23.81 kg/m.  Advanced Directives 05/21/2021 05/22/2020 04/26/2020 04/26/2020 04/25/2020 04/24/2020 11/16/2018  Does Patient Have a Medical Advance Directive? Yes Yes - No No No Yes  Type of Paramedic of [place];Living will Living will - - - - Living will  Does patient want to make changes to medical advance directive? - - - - - - Yes (MAU/Ambulatory/Procedural Areas - Information given)  Copy of [place] in Chart? No - copy requested - - - - - -  Would patient like information on creating a medical advance directive? - - No - Patient declined;Yes (Inpatient - patient requests chaplain consult to create a medical advance directive) No - Patient declined;Yes (Inpatient - patient requests chaplain consult to create a medical advance directive) - No - Patient declined -    Current Medications (verified) Outpatient Encounter Medications as of 05/21/2021  Medication Sig   atorvastatin (LIPITOR) 80 MG tablet TAKE 1 TABLET(80 MG) BY MOUTH AT BEDTIME   ezetimibe (ZETIA) 10 MG tablet Take 1 tablet (10 mg total) by mouth at bedtime.   Multiple Vitamin (MULTIVITAMIN) tablet Take 1 tablet by mouth daily.   sodium chloride 1 g tablet Take 1 tablet (1 g total) by mouth 2 (two) times daily with a meal.   [DISCONTINUED] aspirin 81 MG EC tablet Take by mouth.   [DISCONTINUED] furosemide (LASIX) 20 MG tablet Take by mouth.   [DISCONTINUED] omeprazole (PRILOSEC) 20 MG capsule Take by mouth.    No facility-administered encounter medications on file as of 05/21/2021.    Allergies (verified) Sulfa antibiotics   History: Past Medical History:  Diagnosis Date   Abnormal CT of liver 07/12/2016   Chest CT Feb 2018   Arthritis    Centrilobular emphysema ([place]) 07/12/2016   Chest CT Feb 2018   Coronary artery disease 07/12/2016   Followed by Dr. Saunders Revel, seen on chest CT Feb 2018   Epigastric abdominal pain 03/17/2016   Hyperlipidemia    Overweight (BMI 25.0-29.9) 03/17/2016   Thoracic aortic atherosclerosis ([place]) 07/12/2016   Chest CT scan Feb 2018   Tobacco abuse 03/17/2016   Past Surgical History:  Procedure Laterality Date   BREAST BIOPSY Right 06/25/2020   affirm bx calc, coil marker, path pending   COLONOSCOPY WITH PROPOFOL N/A 11/28/2017   Procedure: COLONOSCOPY WITH PROPOFOL;  Surgeon: Lin Landsman, MD;  Location: [facility];  Service: Gastroenterology;  Laterality: N/A;   COLONOSCOPY WITH PROPOFOL N/A 05/22/2020   Procedure: COLONOSCOPY WITH PROPOFOL;  Surgeon: Lin Landsman, MD;  Location: [facility] ENDOSCOPY;  Service: Gastroenterology;  Laterality: N/A;   INGUINAL HERNIA REPAIR Bilateral    KNEE ARTHROSCOPY Bilateral    TUBAL LIGATION     Family History  Problem Relation Age of Onset   Alzheimer's disease Mother    Breast cancer Mother    Cancer Father  lung   Dementia Sister    Cancer Maternal Grandmother        breast   Breast cancer Maternal Grandmother    Heart attack Maternal Grandfather    Healthy Sister    Breast cancer Maternal Aunt    Bladder Cancer Neg Hx    Kidney cancer Neg Hx    Social History   Socioeconomic History   Marital status: Divorced    Spouse name: Not on file   Number of children: 2   Years of education: some college   Highest education level: 12th grade  Occupational History    Employer: OTHER    Comment: hairdresser  Tobacco Use   Smoking status: Former    Packs/day: 1.00    Years: 52.00    Pack years:  52.00    Types: Cigarettes    Quit date: 04/26/2019    Years since quitting: 2.0   Smokeless tobacco: Never   Tobacco comments:    quit April 25, 2020 most years 1 ppd, and more recently 1/2 ppd, roughly 40-45 pack years  Vaping Use   Vaping Use: Never used  Substance and Sexual Activity   Alcohol use: Yes    Comment: socially   Drug use: No   Sexual activity: Not Currently    Partners: Female  Other Topics Concern   Not on file  Social History Narrative   Pt lives alone   Social Determinants of Health   Financial Resource Strain: Low Risk    Difficulty of Paying Living Expenses: Not hard at all  Food Insecurity: No Food Insecurity   Worried About Charity fundraiser in the Last Year: Never true   [place] in the Last Year: Never true  Transportation Needs: No Transportation Needs   Lack of Transportation (Medical): No   Lack of Transportation (Non-Medical): No  Physical Activity: Inactive   Days of Exercise per Week: 0 days   Minutes of Exercise per Session: 0 min  Stress: No Stress Concern Present   Feeling of Stress : Not at all  Social Connections: Moderately Isolated   Frequency of Communication with Friends and Family: More than three times a week   Frequency of Social Gatherings with Friends and Family: More than three times a week   Attends Religious Services: More than 4 times per year   Active Member of Clubs or Organizations: No   Attends Archivist Meetings: Never   Marital Status: Divorced    Tobacco Counseling Counseling given: Not Answered Tobacco comments: quit April 25, 2020 most years 1 ppd, and more recently 1/2 ppd, roughly 40-45 pack years   Clinical Intake:  Pre-visit preparation completed: Yes  Pain : 0-10 Pain Score: 7  Pain Location: Knee Pain Orientation: Right Pain Descriptors / Indicators: Aching, Sore Pain Onset: More than a month ago Pain Frequency: Constant     BMI - recorded: 23.81 Nutritional  Status: BMI of 19-24  Normal Diabetes: No  How often do you need to have someone help you when you read instructions, pamphlets, or other written materials from your doctor or pharmacy?: 1 - Never    Interpreter Needed?: No  Information entered by :: Clemetine Marker LPN   Activities of Daily Living In your present state of health, do you have any difficulty performing the following activities: 05/21/2021 11/19/2020  Hearing? N N  Vision? N N  Difficulty concentrating or making decisions? N N  Walking or climbing stairs? N N  Dressing or bathing? N N  Doing errands, shopping? N N  Preparing Food and eating ? N -  Using the Toilet? N -  In the past six months, have you accidently leaked urine? N -  Do you have problems with loss of bowel control? N -  Managing your Medications? N -  Managing your Finances? N -  Housekeeping or managing your Housekeeping? N -  Some recent data might be hidden    Patient Care Team: Delsa Grana, PA-C as PCP - General (Family Medicine) Lavonia Dana, MD as Consulting Physician (Nephrology)  Indicate any recent Medical Services you may have received from other than [facility] providers in the past year (date may be approximate).     Assessment:   This is a routine wellness examination for [place].  Hearing/Vision screen Hearing Screening - Comments:: Pt denies hearing difficulty  Vision Screening - Comments:: Past due for eye exam; declines referral; plans to establish care with new provider  Dietary issues and exercise activities discussed: Current Exercise Habits: The patient has a physically strenuous job, but has no regular exercise apart from work., Exercise limited by: orthopedic condition(s)   Goals Addressed             This Visit's Progress    DIET - INCREASE WATER INTAKE   On track    Recommend to drink at least 6-8 8oz glasses of water per day.       Depression Screen PHQ 2/9 Scores 05/21/2021 11/19/2020 05/28/2020 04/30/2020  04/24/2020 01/16/2020 11/16/2018  PHQ - 2 Score 0 0 0 0 0 0 0  PHQ- 9 Score - 0 - - - - -    Fall Risk Fall Risk  05/21/2021 11/19/2020 05/28/2020 04/30/2020 04/24/2020  Falls in the past year? 0 0 0 1 0  Number falls in past yr: 0 0 0 1 0  Injury with Fall? 0 0 0 0 0  Risk for fall due to : No Fall Risks - - - No Fall Risks  Risk for fall due to: Comment - - - - -  Follow up Falls prevention discussed - - - Falls prevention discussed    FALL RISK PREVENTION PERTAINING TO THE HOME:  Any stairs in or around the home? Yes  If so, are there any without handrails? No  Home free of loose throw rugs in walkways, pet beds, electrical cords, etc? Yes  Adequate lighting in your home to reduce risk of falls? Yes   ASSISTIVE DEVICES UTILIZED TO PREVENT FALLS:  Life alert? No  Use of a cane, walker or w/c? No  Grab bars in the bathroom? Yes  Shower chair or bench in shower? Yes  Elevated toilet seat or a handicapped toilet? Yes   TIMED UP AND GO:  Was the test performed? Yes .  Length of time to ambulate 10 feet: 5 sec.   Gait steady and fast without use of assistive device  Cognitive Function:     6CIT Screen 11/16/2018 10/06/2017  What Year? 0 points 0 points  What month? 0 points 0 points  What time? 0 points 0 points  Count back from 20 0 points 0 points  Months in reverse 0 points 0 points  Repeat phrase 0 points 0 points  Total Score 0 0    Immunizations Immunization History  Administered Date(s) Administered   Influenza-Unspecified 06/23/2014   PFIZER(Purple Top)SARS-COV-2 Vaccination 07/12/2019, 08/02/2019   Pneumococcal Conjugate-13 05/29/2014   Tdap 06/23/2014    TDAP status: Up  to date  Flu Vaccine status: Due, Education has been provided regarding the importance of this vaccine. Advised may receive this vaccine at local pharmacy or Health Dept. Aware to provide a copy of the vaccination record if obtained from local pharmacy or Health Dept. Verbalized acceptance and  understanding.  Pneumococcal vaccine status: Due, Education has been provided regarding the importance of this vaccine. Advised may receive this vaccine at local pharmacy or Health Dept. Aware to provide a copy of the vaccination record if obtained from local pharmacy or Health Dept. Verbalized acceptance and understanding.  Covid-19 vaccine status: Completed vaccines  Qualifies for Shingles Vaccine? Yes   Zostavax completed No   Shingrix Completed?: No.    Education has been provided regarding the importance of this vaccine. Patient has been advised to call insurance company to determine out of pocket expense if they have not yet received this vaccine. Advised may also receive vaccine at local pharmacy or Health Dept. Verbalized acceptance and understanding.  Screening Tests Health Maintenance  Topic Date Due   Zoster Vaccines- Shingrix (1 of 2) Never done   Pneumonia Vaccine 71+ Years old (2 - PPSV23 if available, else PCV20) 05/30/2015   COVID-19 Vaccine (3 - Booster for Pfizer series) 09/27/2019   INFLUENZA VACCINE  12/15/2020   DEXA SCAN  11/19/2021 (Originally 04/22/2016)   MAMMOGRAM  06/12/2021   TETANUS/TDAP  06/23/2024   COLONOSCOPY (Pts 45-20yrs Insurance coverage will need to be confirmed)  05/22/2025   Hepatitis C Screening  Completed   HPV VACCINES  Aged Out    Health Maintenance  Health Maintenance Due  Topic Date Due   Zoster Vaccines- Shingrix (1 of 2) Never done   Pneumonia Vaccine 61+ Years old (2 - PPSV23 if available, else PCV20) 05/30/2015   COVID-19 Vaccine (3 - Booster for Pfizer series) 09/27/2019   INFLUENZA VACCINE  12/15/2020    Colorectal cancer screening: Type of screening: Colonoscopy. Completed 05/22/20. Repeat every 5 years  Mammogram status: Completed 06/12/20. Repeat every year  Bone Density status: Completed 04/22/14. Results reflect: Bone density results: OSTEOPENIA. Repeat every 2 years. Ordered 11/19/20  Lung Cancer Screening: (Low Dose CT Chest  recommended if Age 70-80 years, 30 pack-year currently smoking OR have quit w/in 15years.) does qualify. Completed 07/14/20.   Additional Screening:  Hepatitis C Screening: does qualify; Completed 07/12/16  Vision Screening: Recommended annual ophthalmology exams for early detection of glaucoma and other disorders of the eye. Is the patient up to date with their annual eye exam?  No  Who is the provider or what is the name of the office in which the patient attends annual eye exams? Not established If pt is not established with a provider, would they like to be referred to a provider to establish care? No .   Dental Screening: Recommended annual dental exams for proper oral hygiene  Community Resource Referral / Chronic Care Management: CRR required this visit?  No   CCM required this visit?  No      Plan:     I have personally reviewed and noted the following in the patients chart:   Medical and social history Use of alcohol, tobacco or illicit drugs  Current medications and supplements including opioid prescriptions.  Functional ability and status Nutritional status Physical activity Advanced directives List of other physicians Hospitalizations, surgeries, and ER visits in previous 12 months Vitals Screenings to include cognitive, depression, and falls Referrals and appointments  In addition, I have reviewed and discussed with patient  certain preventive protocols, quality metrics, and best practice recommendations. A written personalized care plan for preventive services as well as general preventive health recommendations were provided to patient.     Clemetine Marker, LPN   08/20/4312   Nurse Notes: none

## 2021-08-13 ENCOUNTER — Telehealth: Payer: Self-pay | Admitting: *Deleted

## 2021-08-13 NOTE — Telephone Encounter (Signed)
ATC to schedule Yearly Lung CA CT Scan. No answer and VM box is full.  ?

## 2021-12-22 ENCOUNTER — Other Ambulatory Visit: Payer: Self-pay | Admitting: Family Medicine

## 2021-12-22 DIAGNOSIS — E7849 Other hyperlipidemia: Secondary | ICD-10-CM

## 2021-12-22 NOTE — Telephone Encounter (Signed)
Pt needs an appt for refills.  

## 2022-05-25 ENCOUNTER — Ambulatory Visit: Payer: Medicare Other

## 2022-06-09 ENCOUNTER — Ambulatory Visit: Payer: Medicare Other | Admitting: Family Medicine

## 2022-06-10 ENCOUNTER — Ambulatory Visit: Payer: Medicare Other

## 2022-06-23 IMAGING — US US THYROID
1 series · 13 of 25 positions shown · non-contrast
Comparison: CT chest 04/26/2020

CLINICAL DATA: Thyroid nodule seen on CT

EXAM:
THYROID ULTRASOUND
TECHNIQUE: Ultrasound examination of the thyroid gland and adjacent soft
tissues was performed.

[Series 1: us thyroid · 0.07mm/px · 13 of 66 slices shown]
[im 1/66]
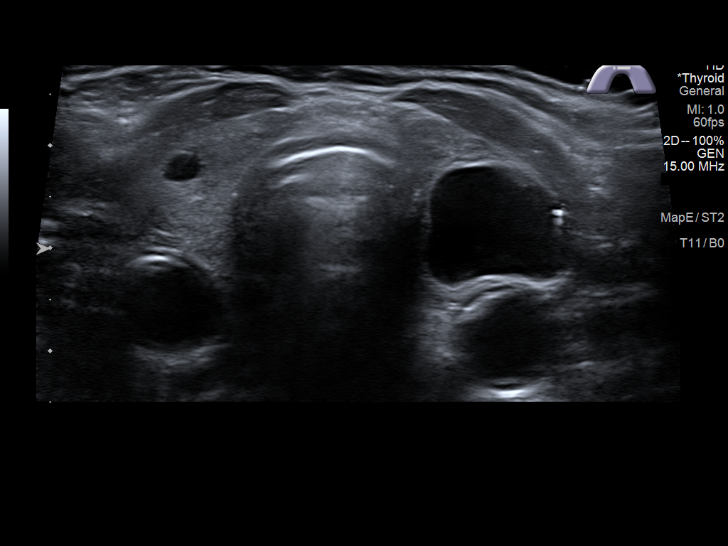
[im 6/66]
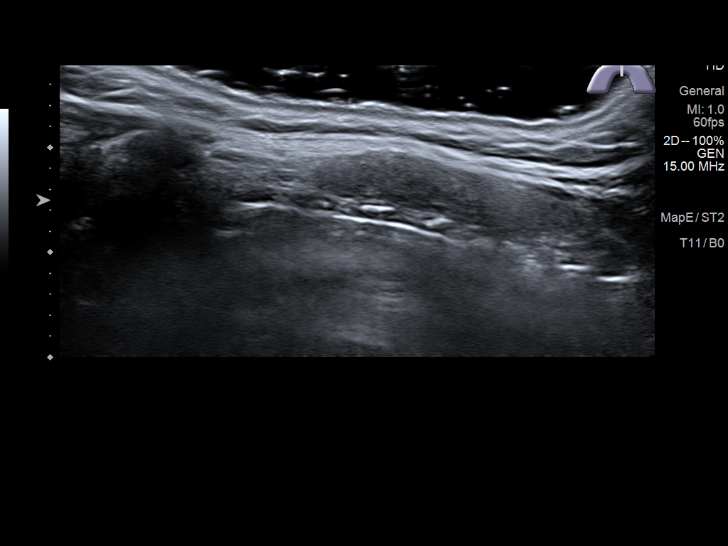
[im 11/66]
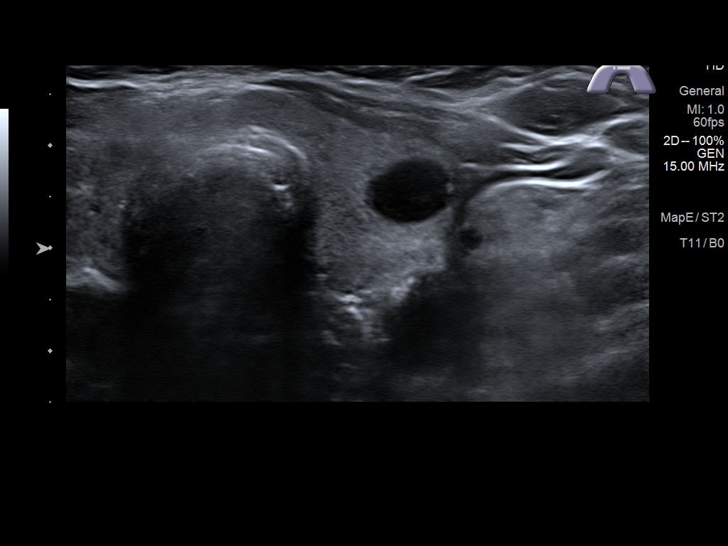
[im 17/66]
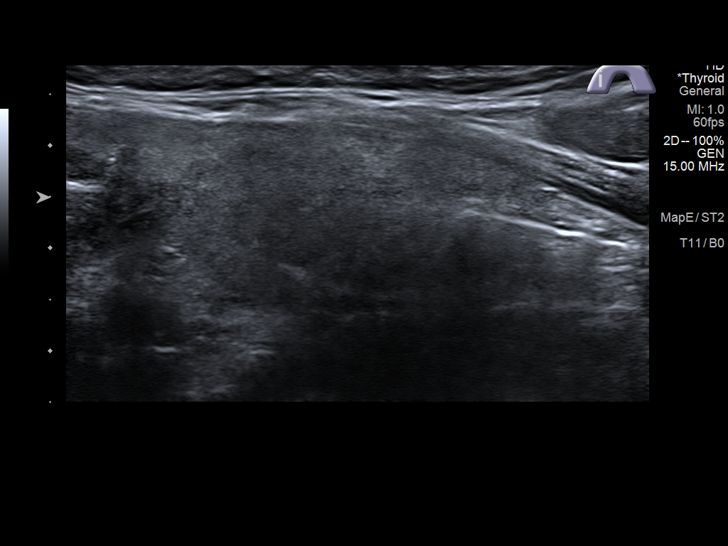
[im 22/66]
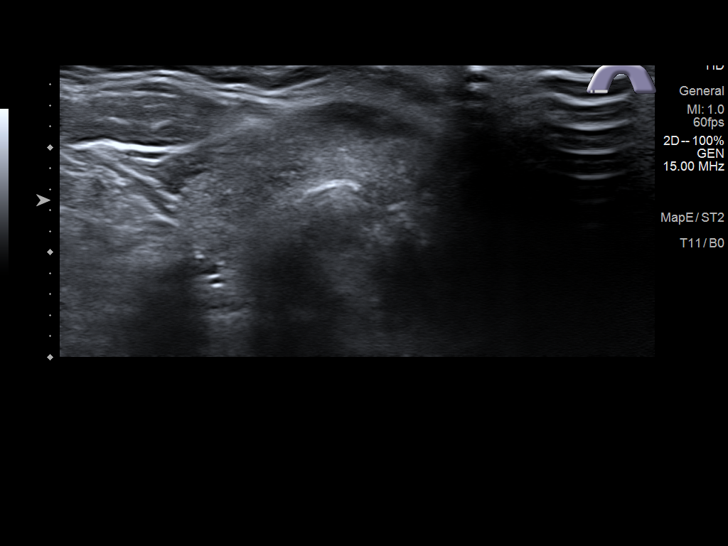
[im 28/66]
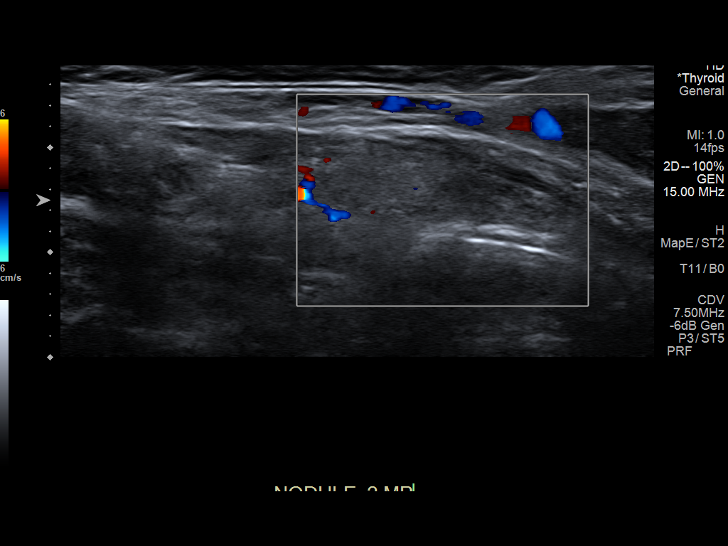
[im 33/66]
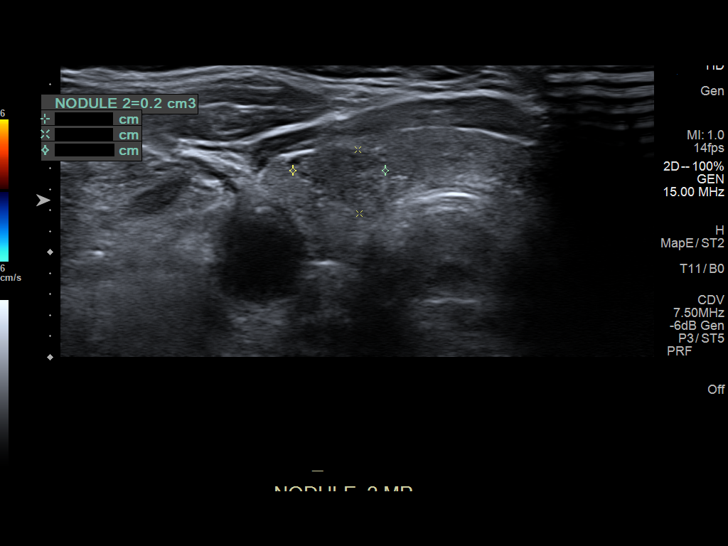
[im 38/66]
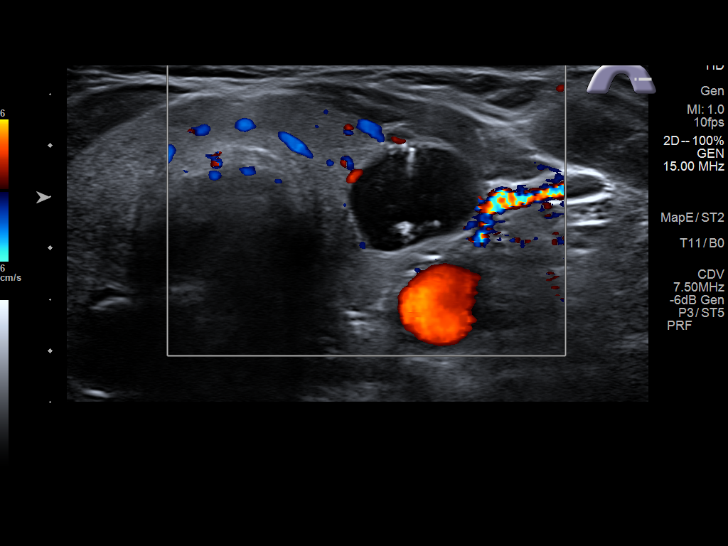
[im 44/66]
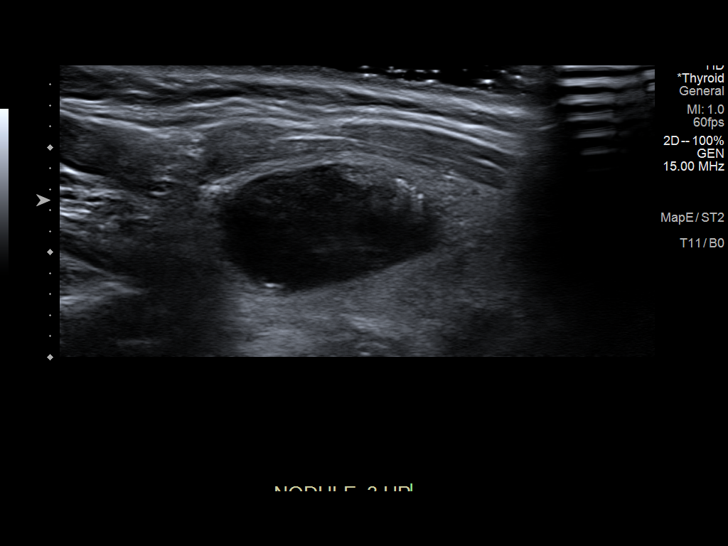
[im 49/66]
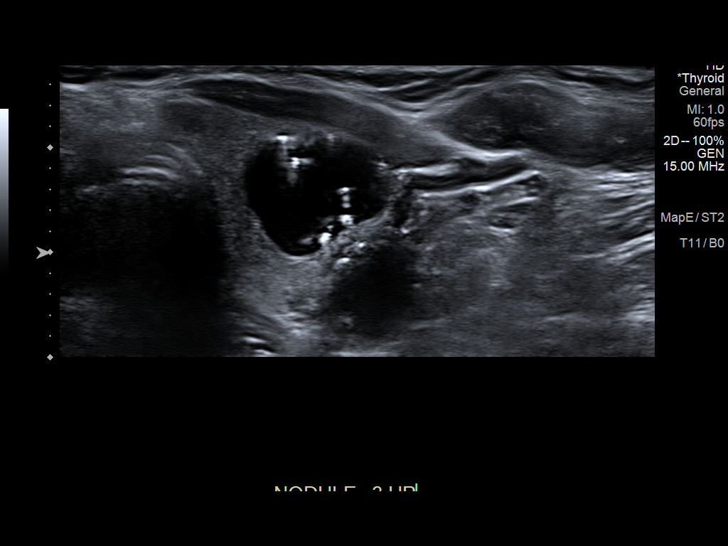
[im 55/66]
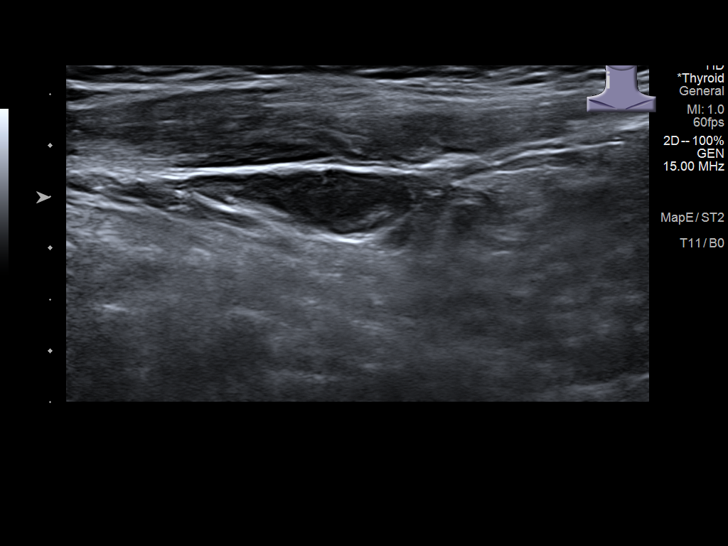
[im 60/66]
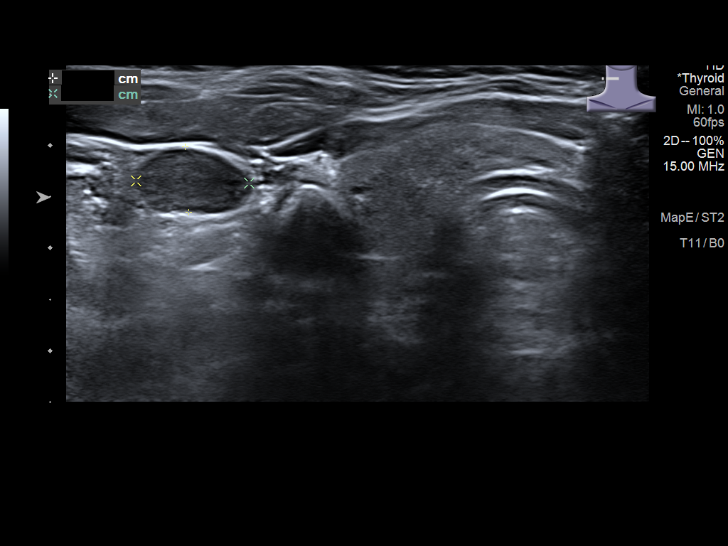
[im 66/66]
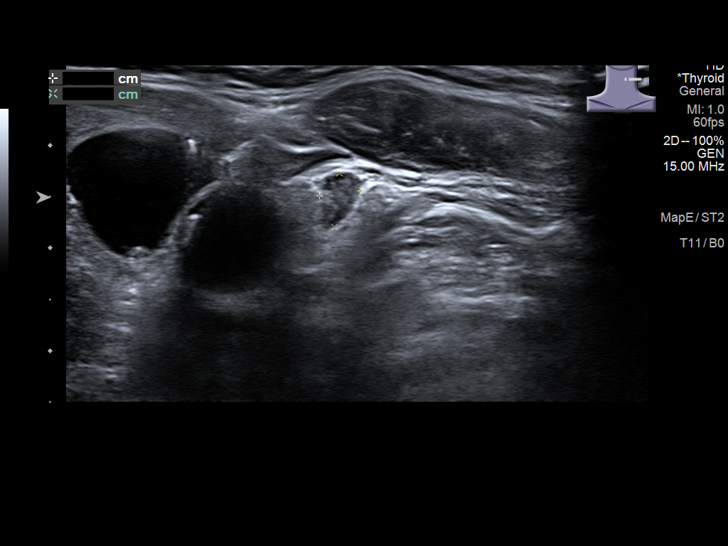

[13 of 25 positions shown; findings below may reference images not displayed]

FINDINGS: Parenchymal Echotexture: Mildly heterogenous

Isthmus: 0.6 cm

Right lobe: 4.2 x 1.3 x 1.1 cm

Left lobe: 4.5 x 1.8 x 1.5 cm

_________________________________________________________

Estimated total number of nodules >/= 1 cm: 1

Number of spongiform nodules >/=  2 cm not described below (TR1): 0

Number of mixed cystic and solid nodules >/= 1.5 cm not described
below (TR2): 0

_________________________________________________________

2.6 cm cystic nodule in the left thyroid lobe does not meet criteria
for FNA or imaging follow-up.

Nodule # 1:

Location: Right; superior

Maximum size: 0.5 cm; Other 2 dimensions: 0.5 x 0.3 cm

Composition: solid/almost completely solid (2)

Echogenicity: hypoechoic (2)

Shape: not taller-than-wide (0)

Margins: ill-defined (0)

Echogenic foci: none (0)

ACR TI-RADS total points: 4.

ACR TI-RADS risk category: TR4 (4-6 points).

ACR TI-RADS recommendations:

Given size (<0.9 cm) and appearance, this nodule does NOT meet
TI-RADS criteria for biopsy or dedicated follow-up.

_________________________________________________________

Nodule # 2:

Location: Right; mid

Maximum size: 0.9 cm; Other 2 dimensions: 0.7 x 0.6 cm

Composition: solid/almost completely solid (2)

Echogenicity: hypoechoic (2)

Shape: not taller-than-wide (0)

Margins: ill-defined (0)

Echogenic foci: none (0)

ACR TI-RADS total points: 4.

ACR TI-RADS risk category: TR4 (4-6 points).

ACR TI-RADS recommendations:

Given size (<0.9 cm) and appearance, this nodule does NOT meet
TI-RADS criteria for biopsy or dedicated follow-up.

_________________________________________________________

Mildly prominent bilateral neck lymph nodes are present.
IMPRESSION: 1. Bilateral thyroid nodules do not meet criteria for FNA or imaging
follow-up.
2. Nonspecific mildly prominent bilateral neck lymph nodes are most
likely inflammatory. The exact etiology is not certain.

The above is in keeping with the ACR TI-RADS recommendations - [HOSPITAL] 4430;[DATE].

## 2022-07-07 IMAGING — MG MM BREAST BX W/ LOC DEV 1ST LESION IMAGE BX SPEC STEREO GUIDE*R*
8 of 9 series · 8 of 17 positions shown · non-contrast
Comparison: Previous exams.
COMPARISON: Previous exams.

Addendum:
CLINICAL DATA: 75-year-old female with indeterminate right breast
calcifications.

EXAM:
RIGHT BREAST STEREOTACTIC CORE NEEDLE BIOPSY

[R (1 of 5)]
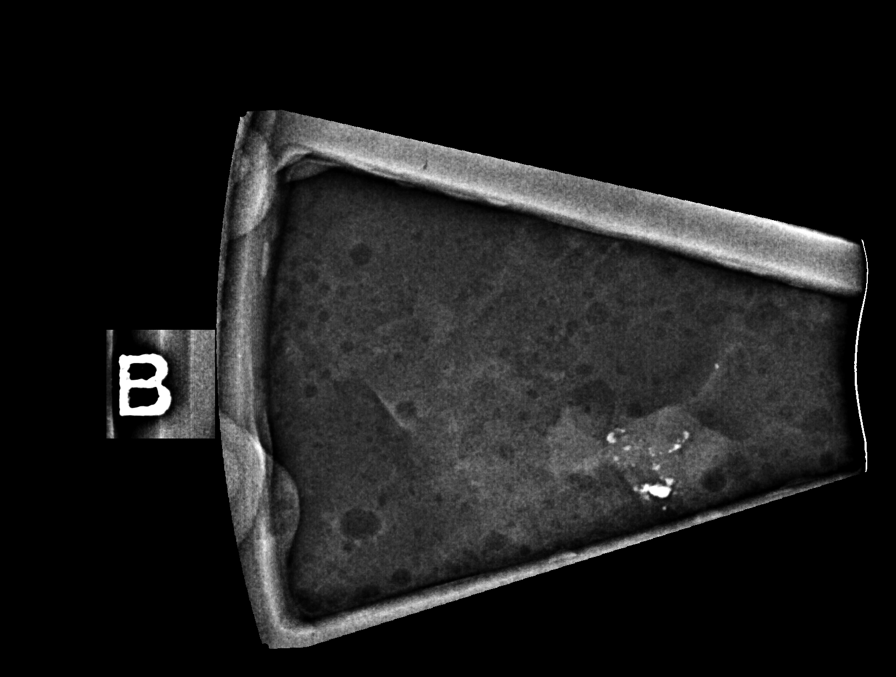

[R (2 of 5)]
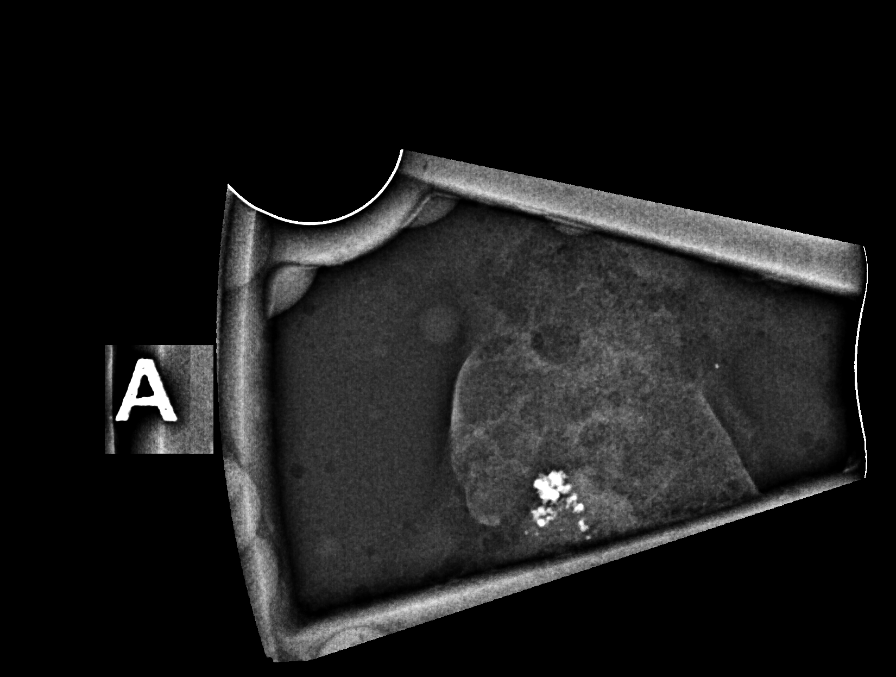

[R (3 of 5)]
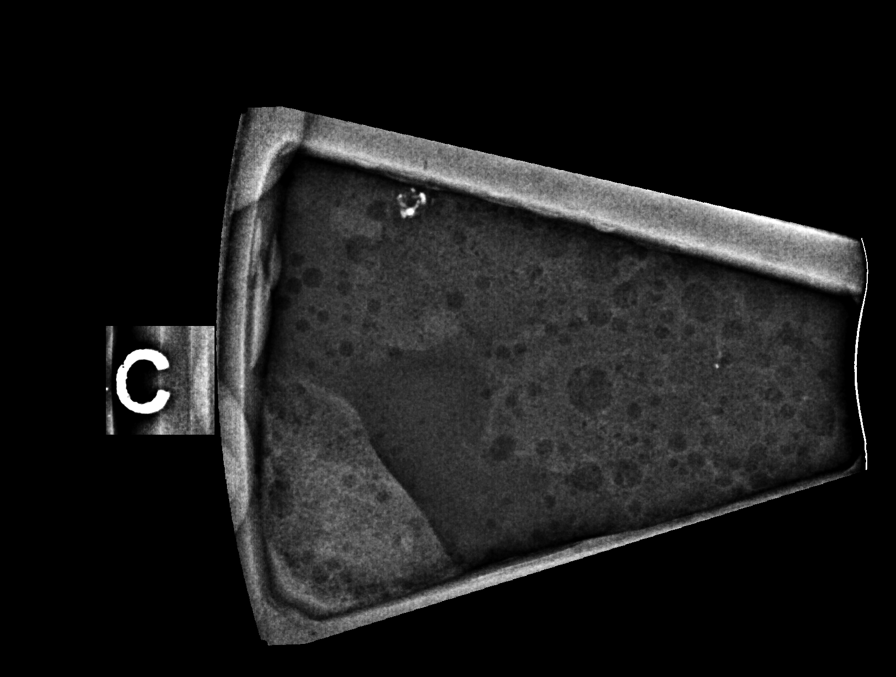

[R (4 of 5)]
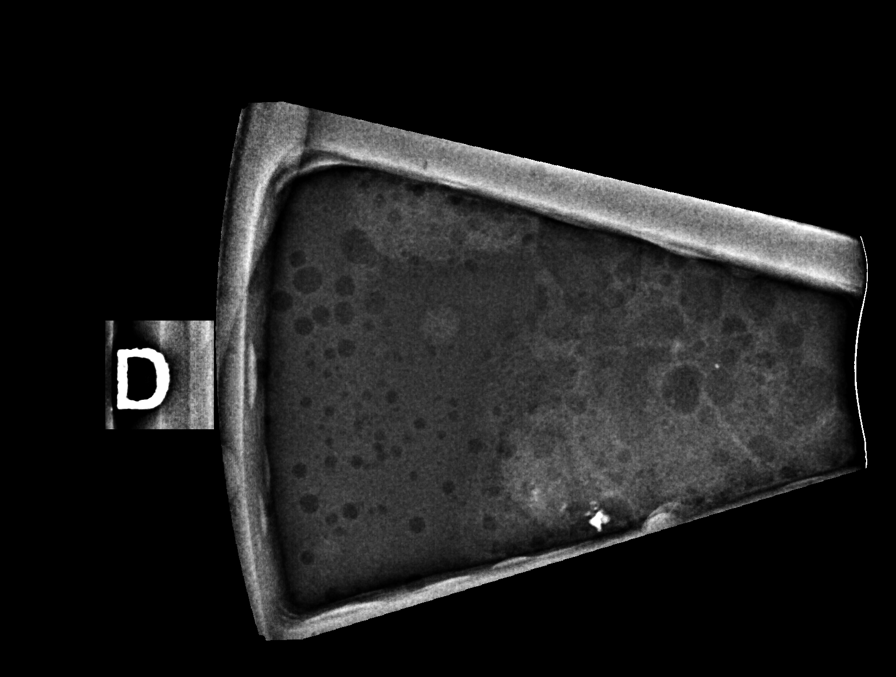

[R (5 of 5)]
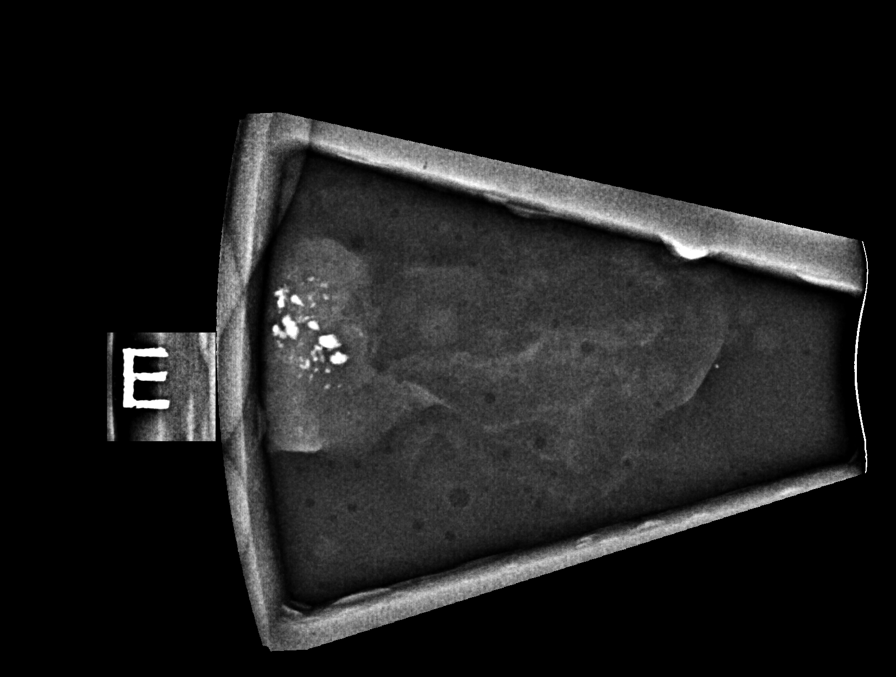

[R CC (1 of 2)]
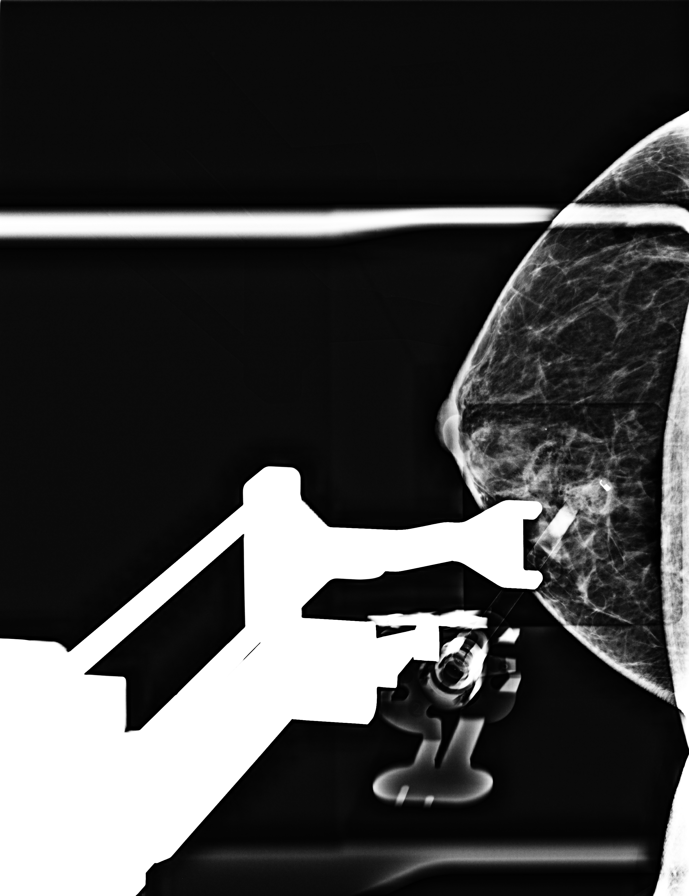

[R CC (2 of 2)]
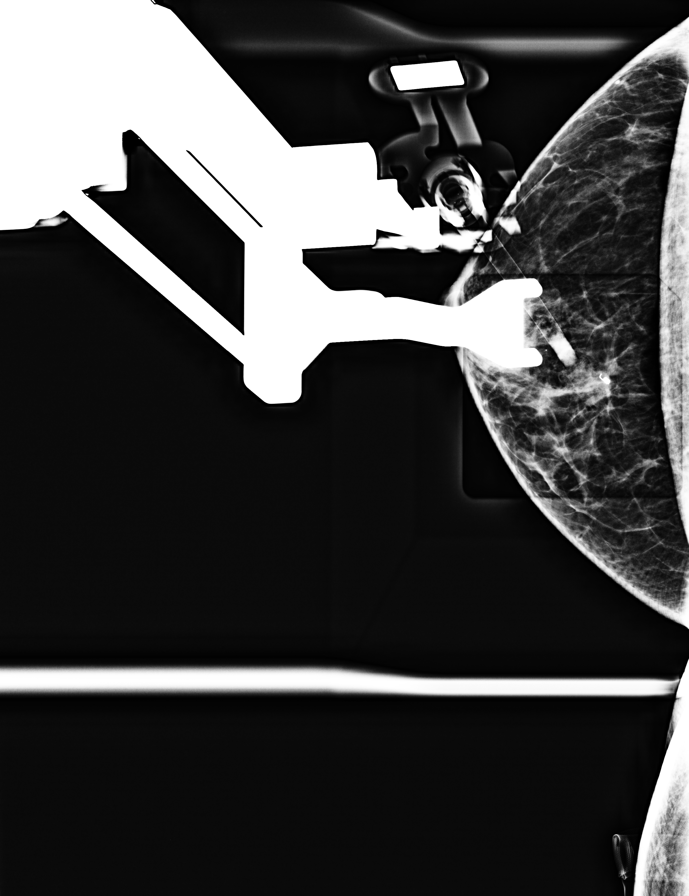

[R CC tomo · tomo slice 21/42.0]
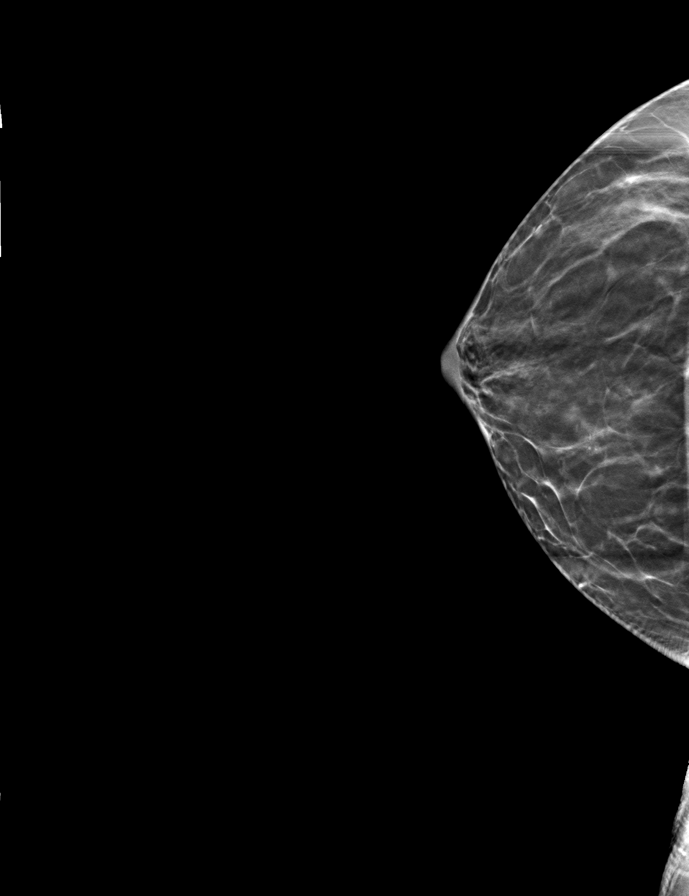

[8 of 17 positions shown; findings below may reference images not displayed]



Using sterile technique and 1% Lidocaine as local anesthetic, under
stereotactic guidance, a 9 gauge vacuum assisted device was used to
perform core needle biopsy of calcifications in the slightly upper
inner right breast using a superior approach. Specimen radiograph
was performed showing calcifications in all 5 specimens. Specimens
with calcifications are identified for pathology.

Lesion quadrant: Upper inner quadrant

At the conclusion of the procedure, a coil shaped tissue marker clip
was deployed into the biopsy cavity. Follow-up 2-view mammogram was
performed and dictated separately.
IMPRESSION: Stereotactic-guided biopsy of the right breast. No apparent
complications.

ADDENDUM:
PATHOLOGY revealed: A. BREAST, RIGHT UPPER INNER QUADRANT;
STEREOTACTIC CORE NEEDLE BIOPSY: - FRAGMENTS OF BENIGN FIBROADENOMA
WITH ASSOCIATED COARSE CALCIFICATIONS. - NEGATIVE FOR ATYPICAL
PROLIFERATIVE BREAST DISEASE.

Pathology results are CONCORDANT with imaging findings, per Dr.
Carey Tojin.

Pathology results and recommendations below were discussed with
patient by telephone on 06/26/2020. Patient reported biopsy site
within normal limits with slight tenderness at the site. Post biopsy
care instructions were reviewed, questions were answered and my
direct phone number was provided to patient. Patient was instructed
to call [HOSPITAL] if any concerns or questions arise
related to the biopsy.

Recommendation: Patient to return in six months for unilateral LEFT
breast diagnostic mammogram and possible ultrasound to follow-up on
4 mm mass in the lateral anterior LEFT breast. Patient informed a
reminder notice will be sent regarding this appointment and she will
need to call mammography site to schedule this appointment.

Pathology results reported by Nya Jumper RN on 06/26/2020.



Using sterile technique and 1% Lidocaine as local anesthetic, under
stereotactic guidance, a 9 gauge vacuum assisted device was used to
perform core needle biopsy of calcifications in the slightly upper
inner right breast using a superior approach. Specimen radiograph
was performed showing calcifications in all 5 specimens. Specimens
with calcifications are identified for pathology.

Lesion quadrant: Upper inner quadrant

At the conclusion of the procedure, a coil shaped tissue marker clip
was deployed into the biopsy cavity. Follow-up 2-view mammogram was
performed and dictated separately.
IMPRESSION: Stereotactic-guided biopsy of the right breast. No apparent
complications.

## 2022-07-15 ENCOUNTER — Ambulatory Visit: Payer: Medicare Other

## 2022-09-22 ENCOUNTER — Other Ambulatory Visit: Payer: Self-pay | Admitting: Orthopedic Surgery

## 2022-10-04 ENCOUNTER — Telehealth: Payer: Self-pay | Admitting: Family Medicine

## 2022-10-04 NOTE — Telephone Encounter (Signed)
Miram  from [facility]  phone [phone] fax # [phone]    Caller states this is her second time faxing surgical clearance form faxed on 07/2022 and 5/20/204, to [phone], please confirm when received.   Patient is scheduled for right total knee replacement on 6/3.

## 2022-10-04 NOTE — Telephone Encounter (Signed)
Called Julie Beck back and notified her we have received today form, patient has not scheduled for it so that is why we have not faxed anything back yet.

## 2022-10-05 ENCOUNTER — Encounter
Admission: RE | Admit: 2022-10-05 | Discharge: 2022-10-05 | Disposition: A | Discharge status: Home / Self Care | Payer: Medicare Other | Source: Ambulatory Visit | Attending: Orthopedic Surgery | Admitting: Orthopedic Surgery

## 2022-10-05 ENCOUNTER — Other Ambulatory Visit: Payer: Self-pay

## 2022-10-05 VITALS — BP 152/68 | HR 72 | Temp 98.4°F | Resp 18 | Ht 67.0 in | Wt 156.1 lb

## 2022-10-05 DIAGNOSIS — Z01812 Encounter for preprocedural laboratory examination: Secondary | ICD-10-CM | POA: Diagnosis present

## 2022-10-05 DIAGNOSIS — R9431 Abnormal electrocardiogram [ECG] [EKG]: Secondary | ICD-10-CM | POA: Diagnosis not present

## 2022-10-05 DIAGNOSIS — Z01818 Encounter for other preprocedural examination: Secondary | ICD-10-CM | POA: Insufficient documentation

## 2022-10-05 HISTORY — DX: Syndrome of inappropriate secretion of antidiuretic hormone: E22.2

## 2022-10-05 HISTORY — DX: Personal history of urinary calculi: Z87.442

## 2022-10-05 LAB — CBC WITH DIFFERENTIAL/PLATELET
Abs Immature Granulocytes: 0.02 10*3/uL (ref 0.00–0.07)
Basophils Absolute: 0 10*3/uL (ref 0.0–0.1)
Basophils Relative: 1 %
Eosinophils Absolute: 0.1 10*3/uL (ref 0.0–0.5)
Eosinophils Relative: 2 %
HCT: 41.2 % (ref 36.0–46.0)
Hemoglobin: 13.3 g/dL (ref 12.0–15.0)
Immature Granulocytes: 0 %
Lymphocytes Relative: 28 %
Lymphs Abs: 1.7 10*3/uL (ref 0.7–4.0)
MCH: 31.7 pg (ref 26.0–34.0)
MCHC: 32.3 g/dL (ref 30.0–36.0)
MCV: 98.1 fL (ref 80.0–100.0)
Monocytes Absolute: 0.5 10*3/uL (ref 0.1–1.0)
Monocytes Relative: 8 %
Neutro Abs: 3.7 10*3/uL (ref 1.7–7.7)
Neutrophils Relative %: 61 %
Platelets: 285 10*3/uL (ref 150–400)
RBC: 4.2 MIL/uL (ref 3.87–5.11)
RDW: 15.3 % (ref 11.5–15.5)
WBC: 6 10*3/uL (ref 4.0–10.5)
nRBC: 0 % (ref 0.0–0.2)

## 2022-10-05 LAB — COMPREHENSIVE METABOLIC PANEL
ALT: 14 U/L (ref 0–44)
AST: 25 U/L (ref 15–41)
Albumin: 4.2 g/dL (ref 3.5–5.0)
Alkaline Phosphatase: 94 U/L (ref 38–126)
Anion gap: 10 (ref 5–15)
BUN: 16 mg/dL (ref 8–23)
CO2: 24 mmol/L (ref 22–32)
Calcium: 8.8 mg/dL — ABNORMAL LOW (ref 8.9–10.3)
Chloride: 101 mmol/L (ref 98–111)
Creatinine, Ser: 0.68 mg/dL (ref 0.44–1.00)
GFR, Estimated: >60 mL/min (ref >60)
Glucose, Bld: 84 mg/dL (ref 70–99)
Potassium: 4.1 mmol/L (ref 3.5–5.1)
Sodium: 135 mmol/L (ref 135–145)
Total Bilirubin: 0.8 mg/dL (ref 0.3–1.2)
Total Protein: 7.8 g/dL (ref 6.5–8.1)

## 2022-10-05 LAB — URINALYSIS, ROUTINE W REFLEX MICROSCOPIC
Bilirubin Urine: NEGATIVE
Glucose, UA: NEGATIVE mg/dL
Ketones, ur: NEGATIVE mg/dL
Nitrite: NEGATIVE
Protein, ur: NEGATIVE mg/dL
Specific Gravity, Urine: 1.018 (ref 1.005–1.030)
pH: 5 (ref 5.0–8.0)

## 2022-10-05 LAB — SURGICAL PCR SCREEN
MRSA, PCR: NEGATIVE
Staphylococcus aureus: POSITIVE — AB

## 2022-10-05 LAB — TYPE AND SCREEN
ABO/RH(D): A NEG
Antibody Screen: NEGATIVE

## 2022-10-05 NOTE — Patient Instructions (Signed)
Your procedure is scheduled on: Monday October 18, 2022. Report to the Registration Desk on the 1st floor of the [facility]. To find out your arrival time, please call [phone] between 1PM - 3PM on: Friday Oct 15, 2022. If your arrival time is 6:00 am, do not arrive before that time as the [facility] entrance doors do not open until 6:00 am.  REMEMBER: Instructions that are not followed completely may result in serious medical risk, up to and including death; or upon the discretion of your surgeon and anesthesiologist your surgery may need to be rescheduled.  Do not eat food after midnight the night before surgery.  No gum chewing or hard candies.  You may however, drink CLEAR liquids up to 2 hours before you are scheduled to arrive for your surgery. Do not drink anything within 2 hours of your scheduled arrival time.  Clear liquids include: - water  - apple juice without pulp - gatorade (not RED colors) - black coffee or tea (Do NOT add milk or creamers to the coffee or tea) Do NOT drink anything that is not on this list.   In addition, your doctor has ordered for you to drink the provided:  Ensure Pre-Surgery Clear Carbohydrate Drink  Drinking this carbohydrate drink up to two hours before surgery helps to reduce insulin resistance and improve patient outcomes. Please complete drinking 2 hours before scheduled arrival time.  One week prior to surgery: Stop Anti-inflammatories (NSAIDS) such as Advil, Aleve, Ibuprofen, Motrin, Naproxen, Naprosyn and Aspirin based products such as Excedrin, Goody's Powder, BC Powder. Stop ANY OVER THE COUNTER supplements until after surgery. You may however, continue to take Tylenol if needed for pain up until the day of surgery.  Continue taking all prescribed medications with the exception of the following:   Follow recommendations from Cardiologist or PCP regarding stopping blood thinners.  TAKE ONLY THESE MEDICATIONS THE MORNING OF SURGERY  WITH A SIP OF WATER:  None    No Alcohol for 24 hours before or after surgery.  No Smoking including e-cigarettes for 24 hours before surgery.  No chewable tobacco products for at least 6 hours before surgery.  No nicotine patches on the day of surgery.  Do not use any "recreational" drugs for at least a week (preferably 2 weeks) before your surgery.  Please be advised that the combination of cocaine and anesthesia may have negative outcomes, up to and including death. If you test positive for cocaine, your surgery will be cancelled.  On the morning of surgery brush your teeth with toothpaste and water, you may rinse your mouth with mouthwash if you wish. Do not swallow any toothpaste or mouthwash.  Use CHG Soap or wipes as directed on instruction sheet.  Do not wear jewelry, make-up, hairpins, clips or nail polish.  Do not wear lotions, powders, or perfumes.   Do not shave body hair from the neck down 48 hours before surgery.  Contact lenses, hearing aids and dentures may not be worn into surgery.  Do not bring valuables to the hospital. [facility] is not responsible for any missing/lost belongings or valuables.   Total Shoulder Arthroplasty:  use Benzoyl Peroxide 5% Gel as directed on instruction sheet.  Bring your C-PAP to the hospital in case you may have to spend the night.   Notify your doctor if there is any change in your medical condition (cold, fever, infection).  Wear comfortable clothing (specific to your surgery type) to the hospital.  After  surgery, you can help prevent lung complications by doing breathing exercises.  Take deep breaths and cough every 1-2 hours. Your doctor may order a device called an Incentive Spirometer to help you take deep breaths. When coughing or sneezing, hold a pillow firmly against your incision with both hands. This is called "splinting." Doing this helps protect your incision. It also decreases belly discomfort.  If you are being  admitted to the hospital overnight, leave your suitcase in the car. After surgery it may be brought to your room.  In case of increased patient census, it may be necessary for you, the patient, to continue your postoperative care in the Same Day Surgery department.  If you are being discharged the day of surgery, you will not be allowed to drive home. You will need a responsible individual to drive you home and stay with you for 24 hours after surgery.   If you are taking public transportation, you will need to have a responsible individual with you.  Please call the Pre-admissions Testing Dept. at [phone] if you have any questions about these instructions.  Surgery Visitation Policy:  Patients having surgery or a procedure may have two visitors.  Children under the age of 8 must have an adult with them who is not the patient.  Inpatient Visitation:    Visiting hours are 7 a.m. to 8 p.m. Up to four visitors are allowed at one time in a patient room. The visitors may rotate out with other people during the day.  One visitor age 61 or older may stay with the patient overnight and must be in the room by 8 p.m.  Pre-operative 5 CHG Bath Instructions   You can play a key role in reducing the risk of infection after surgery. Your skin needs to be as free of germs as possible. You can reduce the number of germs on your skin by washing with CHG (chlorhexidine gluconate) soap before surgery. CHG is an antiseptic soap that kills germs and continues to kill germs even after washing.   DO NOT use if you have an allergy to chlorhexidine/CHG or antibacterial soaps. If your skin becomes reddened or irritated, stop using the CHG and notify one of our RNs at [phone].   Please shower with the CHG soap starting 4 days before surgery using the following schedule:     Please keep in mind the following:  DO NOT shave, including legs and underarms, starting the day of your first shower.   You  may shave your face at any point before/day of surgery.  Place clean sheets on your bed the day you start using CHG soap. Use a clean washcloth (not used since being washed) for each shower. DO NOT sleep with pets once you start using the CHG.   CHG Shower Instructions:  If you choose to wash your hair and private area, wash first with your normal shampoo/soap.  After you use shampoo/soap, rinse your hair and body thoroughly to remove shampoo/soap residue.  Turn the water OFF and apply about 3 tablespoons (45 ml) of CHG soap to a CLEAN washcloth.  Apply CHG soap ONLY FROM YOUR NECK DOWN TO YOUR TOES (washing for 3-5 minutes)  DO NOT use CHG soap on face, private areas, open wounds, or sores.  Pay special attention to the area where your surgery is being performed.  If you are having back surgery, having someone wash your back for you may be helpful. Wait 2 minutes after CHG soap  is applied, then you may rinse off the CHG soap.  Pat dry with a clean towel  Put on clean clothes/pajamas   If you choose to wear lotion, please use ONLY the CHG-compatible lotions on the back of this paper.     Additional instructions for the day of surgery: DO NOT APPLY any lotions, deodorants, cologne, or perfumes.   Put on clean/comfortable clothes.  Brush your teeth.  Ask your nurse before applying any prescription medications to the skin.      CHG Compatible Lotions   Aveeno Moisturizing lotion  Cetaphil Moisturizing Cream  Cetaphil Moisturizing Lotion  Clairol Herbal Essence Moisturizing Lotion, Dry Skin  Clairol Herbal Essence Moisturizing Lotion, Extra Dry Skin  Clairol Herbal Essence Moisturizing Lotion, Normal Skin  Curel Age Defying Therapeutic Moisturizing Lotion with Alpha Hydroxy  Curel Extreme Care Body Lotion  Curel Soothing Hands Moisturizing Hand Lotion  Curel Therapeutic Moisturizing Cream, Fragrance-Free  Curel Therapeutic Moisturizing Lotion, Fragrance-Free  Curel Therapeutic  Moisturizing Lotion, Original Formula  Eucerin Daily Replenishing Lotion  Eucerin Dry Skin Therapy Plus Alpha Hydroxy Crme  Eucerin Dry Skin Therapy Plus Alpha Hydroxy Lotion  Eucerin Original Crme  Eucerin Original Lotion  Eucerin Plus Crme Eucerin Plus Lotion  Eucerin TriLipid Replenishing Lotion  Keri Anti-Bacterial Hand Lotion  Keri Deep Conditioning Original Lotion Dry Skin Formula Softly Scented  Keri Deep Conditioning Original Lotion, Fragrance Free Sensitive Skin Formula  Keri Lotion Fast Absorbing Fragrance Free Sensitive Skin Formula  Keri Lotion Fast Absorbing Softly Scented Dry Skin Formula  Keri Original Lotion  Keri Skin Renewal Lotion Keri Silky Smooth Lotion  Keri Silky Smooth Sensitive Skin Lotion  Nivea Body Creamy Conditioning Oil  Nivea Body Extra Enriched Lotion  Nivea Body Original Lotion  Nivea Body Sheer Moisturizing Lotion Nivea Crme  Nivea Skin Firming Lotion  NutraDerm 30 Skin Lotion  NutraDerm Skin Lotion  NutraDerm Therapeutic Skin Cream  NutraDerm Therapeutic Skin Lotion  ProShield Protective Hand Cream  Provon moisturizing lotion How to Use an Incentive Spirometer  An incentive spirometer is a tool that measures how well you are filling your lungs with each breath. Learning to take long, deep breaths using this tool can help you keep your lungs clear and active. This may help to reverse or lessen your chance of developing breathing (pulmonary) problems, especially infection. You may be asked to use a spirometer: After a surgery. If you have a lung problem or a history of smoking. After a long period of time when you have been unable to move or be active. If the spirometer includes an indicator to show the highest number that you have reached, your health care provider or respiratory therapist will help you set a goal. Keep a log of your progress as told by your health care provider. What are the risks? Breathing too quickly may cause dizziness or  cause you to pass out. Take your time so you do not get dizzy or light-headed. If you are in pain, you may need to take pain medicine before doing incentive spirometry. It is harder to take a deep breath if you are having pain. How to use your incentive spirometer  Sit up on the edge of your bed or on a chair. Hold the incentive spirometer so that it is in an upright position. Before you use the spirometer, breathe out normally. Place the mouthpiece in your mouth. Make sure your lips are closed tightly around it. Breathe in slowly and as deeply as you can through  your mouth, causing the piston or the ball to rise toward the top of the chamber. Hold your breath for 3-5 seconds, or for as long as possible. If the spirometer includes a coach indicator, use this to guide you in breathing. Slow down your breathing if the indicator goes above the marked areas. Remove the mouthpiece from your mouth and breathe out normally. The piston or ball will return to the bottom of the chamber. Rest for a few seconds, then repeat the steps 10 or more times. Take your time and take a few normal breaths between deep breaths so that you do not get dizzy or light-headed. Do this every 1-2 hours when you are awake. If the spirometer includes a goal marker to show the highest number you have reached (best effort), use this as a goal to work toward during each repetition. After each set of 10 deep breaths, cough a few times. This will help to make sure that your lungs are clear. If you have an incision on your chest or abdomen from surgery, place a pillow or a rolled-up towel firmly against the incision when you cough. This can help to reduce pain while taking deep breaths and coughing. General tips When you are able to get out of bed: Walk around often. Continue to take deep breaths and cough in order to clear your lungs. Keep using the incentive spirometer until your health care provider says it is okay to stop using  it. If you have been in the hospital, you may be told to keep using the spirometer at home. Contact a health care provider if: You are having difficulty using the spirometer. You have trouble using the spirometer as often as instructed. Your pain medicine is not giving enough relief for you to use the spirometer as told. You have a fever. Get help right away if: You develop shortness of breath. You develop a cough with bloody mucus from the lungs. You have fluid or blood coming from an incision site after you cough. Summary An incentive spirometer is a tool that can help you learn to take long, deep breaths to keep your lungs clear and active. You may be asked to use a spirometer after a surgery, if you have a lung problem or a history of smoking, or if you have been inactive for a long period of time. Use your incentive spirometer as instructed every 1-2 hours while you are awake. If you have an incision on your chest or abdomen, place a pillow or a rolled-up towel firmly against your incision when you cough. This will help to reduce pain. Get help right away if you have shortness of breath, you cough up bloody mucus, or blood comes from your incision when you cough. This information is not intended to replace advice given to you by your health care provider. Make sure you discuss any questions you have with your health care provider. Document Revised: 07/23/2019 Document Reviewed: 07/23/2019 Elsevier Patient Education  2023 [employer].

## 2022-10-07 ENCOUNTER — Encounter: Payer: Self-pay | Admitting: Family Medicine

## 2022-10-07 ENCOUNTER — Ambulatory Visit (INDEPENDENT_AMBULATORY_CARE_PROVIDER_SITE_OTHER): Payer: Medicare Other | Admitting: Family Medicine

## 2022-10-07 VITALS — BP 174/78 | HR 90 | Temp 97.9°F | Resp 16 | Ht 68.0 in | Wt 156.5 lb

## 2022-10-07 DIAGNOSIS — I700 Atherosclerosis of aorta: Secondary | ICD-10-CM

## 2022-10-07 DIAGNOSIS — E871 Hypo-osmolality and hyponatremia: Secondary | ICD-10-CM | POA: Diagnosis not present

## 2022-10-07 DIAGNOSIS — J432 Centrilobular emphysema: Secondary | ICD-10-CM

## 2022-10-07 DIAGNOSIS — Z01818 Encounter for other preprocedural examination: Secondary | ICD-10-CM

## 2022-10-07 DIAGNOSIS — I10 Essential (primary) hypertension: Secondary | ICD-10-CM | POA: Diagnosis not present

## 2022-10-07 DIAGNOSIS — I2510 Atherosclerotic heart disease of native coronary artery without angina pectoris: Secondary | ICD-10-CM

## 2022-10-07 DIAGNOSIS — E7849 Other hyperlipidemia: Secondary | ICD-10-CM

## 2022-10-07 MED ORDER — AMLODIPINE BESYLATE 5 MG PO TABS
5.0000 mg | ORAL_TABLET | Freq: Every day | ORAL | 1 refills | Status: DC
Start: 2022-10-07 — End: 2023-10-24

## 2022-10-07 NOTE — Patient Instructions (Addendum)
[facility] readings: 08/11/2022 152/78 10/06/2022 160/66  Our readings BP Readings from Last 10 Encounters:  10/07/22 (!) 170/84  10/05/22 (!) 152/68  05/21/21 (!) 144/72  11/19/20 136/78  05/28/20 132/68  05/22/20 (!) 141/68  04/30/20 130/60  04/27/20 139/69  04/24/20 (!) 162/76  01/16/20 132/68   BP goal is to be below 130/80  How to Take Your Blood Pressure Blood pressure measures how strongly your blood is pressing against the walls of your arteries. Arteries are blood vessels that carry blood from your heart throughout your body. You can take your blood pressure at home with a machine. You may need to check your blood pressure at home: To check if you have high blood pressure (hypertension). To check your blood pressure over time. To make sure your blood pressure medicine is working. Supplies needed: Blood pressure machine, or monitor. A chair to sit in. This should be a chair where you can sit upright with your back supported. Do not sit on a soft couch or an armchair. Table or desk. Small notebook. Pencil or pen. How to prepare Avoid these things for 30 minutes before checking your blood pressure: Having drinks with caffeine in them, such as coffee or tea. Drinking alcohol. Eating. Smoking. Exercising. Do these things five minutes before checking your blood pressure: Go to the bathroom and pee (urinate). Sit in a chair. Be quiet. Do not talk. How to take your blood pressure Follow the instructions that came with your machine. If you have a digital blood pressure monitor, these may be the instructions: Sit up straight. Place your feet on the floor. Do not cross your ankles or legs. Rest your left arm at the level of your heart. You may rest it on a table, desk, or chair. Pull up your shirt sleeve. Wrap the blood pressure cuff around the upper part of your left arm. The cuff should be 1 inch (2.5 cm) above your elbow. It is best to wrap the cuff around bare  skin. Fit the cuff snugly around your arm, but not too tightly. You should be able to place only one finger between the cuff and your arm. Place the cord so that it rests in the bend of your elbow. Press the power button. Sit quietly while the cuff fills with air and loses air. Write down the numbers on the screen. Wait 2-3 minutes and then repeat steps 1-10. What do the numbers mean? Two numbers make up your blood pressure. The first number is called systolic pressure. The second is called diastolic pressure. An example of a blood pressure reading is "120 over 80" (or 120/80). If you are an adult and do not have a medical condition, use this guide to find out if your blood pressure is normal: Normal First number: below 120. Second number: below 80. Elevated First number: 120-129. Second number: below 80. Hypertension stage 1 First number: 130-139. Second number: 80-89. Hypertension stage 2 First number: 140 or above. Second number: 90 or above. Your blood pressure is above normal even if only the first or only the second number is above normal. Follow these instructions at home: Medicines Take over-the-counter and prescription medicines only as told by your doctor. Tell your doctor if your medicine is causing side effects. General instructions Check your blood pressure as often as your doctor tells you to. Check your blood pressure at the same time every day. Take your monitor to your next doctor's appointment. Your doctor will: Make sure you are using  it correctly. Make sure it is working right. Understand what your blood pressure numbers should be. Keep all follow-up visits. General tips You will need a blood pressure machine or monitor. Your doctor can suggest a monitor. You can buy one at a drugstore or online. When choosing one: Choose one with an arm cuff. Choose one that wraps around your upper arm. Only one finger should fit between your arm and the cuff. Do not choose  one that measures your blood pressure from your wrist or finger. Where to find more information American Heart Association: www.heart.org Contact a doctor if: Your blood pressure keeps being high. Your blood pressure is suddenly low. Get help right away if: Your first blood pressure number is higher than 180. Your second blood pressure number is higher than 120. These symptoms may be an emergency. Do not wait to see if the symptoms will go away. Get help right away. Call 911. Summary Check your blood pressure at the same time every day. Avoid caffeine, alcohol, smoking, and exercise for 30 minutes before checking your blood pressure. Make sure you understand what your blood pressure numbers should be. This information is not intended to replace advice given to you by your health care provider. Make sure you discuss any questions you have with your health care provider. Document Revised: 01/15/2021 Document Reviewed: 01/15/2021 Elsevier Patient Education  2023 [employer]  DASH Eating Plan DASH stands for Dietary Approaches to Stop Hypertension. The DASH eating plan is a healthy eating plan that has been shown to: Reduce high blood pressure (hypertension). Reduce your risk for type 2 diabetes, heart disease, and stroke. Help with weight loss. What are tips for following this plan? Reading food labels Check food labels for the amount of salt (sodium) per serving. Choose foods with less than 5 percent of the Daily Value of sodium. Generally, foods with less than 300 milligrams (mg) of sodium per serving fit into this eating plan. To find whole grains, look for the word "whole" as the first word in the ingredient list. Shopping Buy products labeled as "low-sodium" or "no salt added." Buy fresh foods. Avoid canned foods and pre-made or frozen meals. Cooking Avoid adding salt when cooking. Use salt-free seasonings or herbs instead of table salt or sea salt. Check with your health care  provider or pharmacist before using salt substitutes. Do not fry foods. Cook foods using healthy methods such as baking, boiling, grilling, roasting, and broiling instead. Cook with heart-healthy oils, such as olive, canola, avocado, soybean, or sunflower oil. Meal planning  Eat a balanced diet that includes: 4 or more servings of fruits and 4 or more servings of vegetables each day. Try to fill one-half of your plate with fruits and vegetables. 6-8 servings of whole grains each day. Less than 6 oz (170 g) of lean meat, poultry, or fish each day. A 3-oz (85-g) serving of meat is about the same size as a deck of cards. One egg equals 1 oz (28 g). 2-3 servings of low-fat dairy each day. One serving is 1 cup (237 mL). 1 serving of nuts, seeds, or beans 5 times each week. 2-3 servings of heart-healthy fats. Healthy fats called omega-3 fatty acids are found in foods such as walnuts, flaxseeds, fortified milks, and eggs. These fats are also found in cold-water fish, such as sardines, salmon, and mackerel. Limit how much you eat of: Canned or prepackaged foods. Food that is high in trans fat, such as some fried foods. Food that  is high in saturated fat, such as fatty meat. Desserts and other sweets, sugary drinks, and other foods with added sugar. Full-fat dairy products. Do not salt foods before eating. Do not eat more than 4 egg yolks a week. Try to eat at least 2 vegetarian meals a week. Eat more home-cooked food and less restaurant, buffet, and fast food. Lifestyle When eating at a restaurant, ask that your food be prepared with less salt or no salt, if possible. If you drink alcohol: Limit how much you use to: 0-1 drink a day for women who are not pregnant. 0-2 drinks a day for men. Be aware of how much alcohol is in your drink. In the [place] one drink equals one 12 oz bottle of beer (355 mL), one 5 oz glass of wine (148 mL), or one 1 oz glass of hard liquor (44 mL). General  information Avoid eating more than 2,300 mg of salt a day. If you have hypertension, you may need to reduce your sodium intake to 1,500 mg a day. Work with your health care provider to maintain a healthy body weight or to lose weight. Ask what an ideal weight is for you. Get at least 30 minutes of exercise that causes your heart to beat faster (aerobic exercise) most days of the week. Activities may include walking, swimming, or biking. Work with your health care provider or dietitian to adjust your eating plan to your individual calorie needs. What foods should I eat? Fruits All fresh, dried, or frozen fruit. Canned fruit in natural juice (without added sugar). Vegetables Fresh or frozen vegetables (raw, steamed, roasted, or grilled). Low-sodium or reduced-sodium tomato and vegetable juice. Low-sodium or reduced-sodium tomato sauce and tomato paste. Low-sodium or reduced-sodium canned vegetables. Grains Whole-grain or whole-wheat bread. Whole-grain or whole-wheat pasta. Brown rice. Orpah Cobb. Bulgur. Whole-grain and low-sodium cereals. Pita bread. Low-fat, low-sodium crackers. Whole-wheat flour tortillas. Meats and other proteins Skinless chicken or [place]. Ground chicken or [place]. Pork with fat trimmed off. Fish and seafood. Egg whites. Dried beans, peas, or lentils. Unsalted nuts, nut butters, and seeds. Unsalted canned beans. Lean cuts of beef with fat trimmed off. Low-sodium, lean precooked or cured meat, such as sausages or meat loaves. Dairy Low-fat (1%) or fat-free (skim) milk. Reduced-fat, low-fat, or fat-free cheeses. Nonfat, low-sodium ricotta or cottage cheese. Low-fat or nonfat yogurt. Low-fat, low-sodium cheese. Fats and oils Soft margarine without trans fats. Vegetable oil. Reduced-fat, low-fat, or light mayonnaise and salad dressings (reduced-sodium). Canola, safflower, olive, avocado, soybean, and sunflower oils. Avocado. Seasonings and condiments Herbs. Spices. Seasoning  mixes without salt. Other foods Unsalted popcorn and pretzels. Fat-free sweets. The items listed above may not be a complete list of foods and beverages you can eat. Contact a dietitian for more information. What foods should I avoid? Fruits Canned fruit in a light or heavy syrup. Fried fruit. Fruit in cream or butter sauce. Vegetables Creamed or fried vegetables. Vegetables in a cheese sauce. Regular canned vegetables (not low-sodium or reduced-sodium). Regular canned tomato sauce and paste (not low-sodium or reduced-sodium). Regular tomato and vegetable juice (not low-sodium or reduced-sodium). Rosita Fire. Olives. Grains Baked goods made with fat, such as croissants, muffins, or some breads. Dry pasta or rice meal packs. Meats and other proteins Fatty cuts of meat. Ribs. Fried meat. Tomasa Blase. Bologna, salami, and other precooked or cured meats, such as sausages or meat loaves. Fat from the back of a pig (fatback). Bratwurst. Salted nuts and seeds. Canned beans with added salt. Canned or  smoked fish. Whole eggs or egg yolks. Chicken or [place] with skin. Dairy Whole or 2% milk, cream, and half-and-half. Whole or full-fat cream cheese. Whole-fat or sweetened yogurt. Full-fat cheese. Nondairy creamers. Whipped toppings. Processed cheese and cheese spreads. Fats and oils Butter. Stick margarine. Lard. Shortening. Ghee. Bacon fat. Tropical oils, such as coconut, palm kernel, or palm oil. Seasonings and condiments Onion salt, garlic salt, seasoned salt, table salt, and sea salt. Worcestershire sauce. Tartar sauce. Barbecue sauce. Teriyaki sauce. Soy sauce, including reduced-sodium. Steak sauce. Canned and packaged gravies. Fish sauce. Oyster sauce. Cocktail sauce. Store-bought horseradish. Ketchup. Mustard. Meat flavorings and tenderizers. Bouillon cubes. Hot sauces. Pre-made or packaged marinades. Pre-made or packaged taco seasonings. Relishes. Regular salad dressings. Other foods Salted popcorn and  pretzels. The items listed above may not be a complete list of foods and beverages you should avoid. Contact a dietitian for more information. Where to find more information National Heart, Lung, and Blood Institute: PopSteam.is American Heart Association: www.heart.org Academy of Nutrition and Dietetics: www.eatright.org National Kidney Foundation: www.kidney.org Summary The DASH eating plan is a healthy eating plan that has been shown to reduce high blood pressure (hypertension). It may also reduce your risk for type 2 diabetes, heart disease, and stroke. When on the DASH eating plan, aim to eat more fresh fruits and vegetables, whole grains, lean proteins, low-fat dairy, and heart-healthy fats. With the DASH eating plan, you should limit salt (sodium) intake to 2,300 mg a day. If you have hypertension, you may need to reduce your sodium intake to 1,500 mg a day. Work with your health care provider or dietitian to adjust your eating plan to your individual calorie needs. This information is not intended to replace advice given to you by your health care provider. Make sure you discuss any questions you have with your health care provider. Document Revised: 04/06/2019 Document Reviewed: 04/06/2019 Elsevier Patient Education  2023 [employer].

## 2022-10-07 NOTE — Progress Notes (Signed)
Patient ID: Julie Beck, female    DOB: Jun 16, 1945, 77 y.o.   MRN: [id number]  PCP: Julie Berry, PA-C  Chief Complaint  Patient presents with   Pre-op Exam    Subjective:   Julie Beck is a 77 y.o. female, presents to clinic with CC of the following:  Here for preop clearance/paperwork She has not been here in almost 2 years  HLD: Previously on statin, she states she is not taking cholesterol meds since last year, has been on statin for most of the last decade.  Reports another provider told her she did not need to be on it and she could stop taking it -she does not know when that was or the reasoning behind this medical recommendation Lab Results  Component Value Date   CHOL 119 11/19/2020   HDL 41 (L) 11/19/2020   LDLCALC 64 11/19/2020   TRIG 65 11/19/2020   CHOLHDL 2.9 11/19/2020    1. SIADH/hyponatremia  - was seeing nephrology She reports no medications for this nor any diet or lifestyle recommendations  HTN-blood pressures have been elevated for some time, but thought to be whitecoat hypertension Never previously treated, never been on medication Blood pressure readings with her last 2 Ortho [facility] visits -she states her blood pressure was normal and nobody said anything to her -these readings were taken from their office visits per care everywhere 08/11/2022 152/78 10/06/2022 160/66  History of BP readings in office BP Readings from Last 10 Encounters:  10/07/22 (!) 170/84  10/05/22 (!) 152/68  05/21/21 (!) 144/72  11/19/20 136/78  05/28/20 132/68  05/22/20 (!) 141/68  04/30/20 130/60  04/27/20 139/69  04/24/20 (!) 162/76  01/16/20 132/68   She does eat a lot of salty foods and snacks She stopped smoking Cut out caffeine   Hx of cirrhosis of liver, central lobar emphysema, thoracic aortic atherosclerosis, coronary artery disease  She is having a total knee replacement and she did just complete preop labs at [facility] They did a EKG She denies any  respiratory sx.  She states she is scheduled for surgery 10/18/22   Well overdue for many screening labs/preventative care Health Maintenance  Topic Date Due   Zoster (Shingles) Vaccine (1 of 2) Never done   Pneumonia Vaccine (2 of 2 - PPSV23 or PCV20) 07/24/2014   DEXA scan (bone density measurement)  04/22/2016   Mammogram  06/12/2021   Screening for Lung Cancer  07/14/2021   COVID-19 Vaccine (3 - 2023-24 season) 01/15/2022   Medicare Annual Wellness Visit  05/21/2022   Flu Shot  12/16/2022   DTaP/Tdap/Td vaccine (2 - Td or Tdap) 06/23/2024   Colon Cancer Screening  05/22/2025   Hepatitis C Screening  Completed   HPV Vaccine  Aged Out   In the last 2 years about 7 appts were cancelled   Patient Active Problem List   Diagnosis Date Noted   History of colonic polyps    Hypo-osmolality and hyponatremia 05/07/2020   SIADH (syndrome of inappropriate ADH production) (HCC)    Pulmonary nodule    Thyroid nodule    Osteopenia 01/16/2020   Cirrhosis of liver (HCC) 07/20/2016   Centrilobular emphysema (HCC) 07/12/2016   Thoracic aortic atherosclerosis (HCC) 07/12/2016   Coronary artery disease 07/12/2016   Personal history of tobacco use, presenting hazards to health 06/11/2016   Hyperlipidemia 03/18/2016   Abnormal EKG 03/17/2016   GERD (gastroesophageal reflux disease) 03/17/2016   Overweight (BMI 25.0-29.9) 03/17/2016  Current Outpatient Medications:    Multiple Vitamin (MULTIVITAMIN) tablet, Take 1 tablet by mouth daily., Disp: , Rfl:    atorvastatin (LIPITOR) 80 MG tablet, TAKE 1 TABLET(80 MG) BY MOUTH AT BEDTIME (Patient not taking: Reported on 10/05/2022), Disp: 90 tablet, Rfl: 3   ezetimibe (ZETIA) 10 MG tablet, TAKE 1 TABLET(10 MG) BY MOUTH AT BEDTIME (Patient not taking: Reported on 10/05/2022), Disp: 30 tablet, Rfl: 0   sodium chloride 1 g tablet, Take 1 tablet (1 g total) by mouth 2 (two) times daily with a meal. (Patient not taking: Reported on 10/05/2022), Disp:  60 tablet, Rfl: 0   Allergies  Allergen Reactions   Sulfa Antibiotics Hives   Latex Itching    Itching every once in a while      Social History   Tobacco Use   Smoking status: Former    Packs/day: 1.00    Years: 52.00    Additional pack years: 0.00    Total pack years: 52.00    Types: Cigarettes    Quit date: 04/26/2019    Years since quitting: 3.4   Smokeless tobacco: Never   Tobacco comments:    quit April 25, 2020 most years 1 ppd, and more recently 1/2 ppd, roughly 40-45 pack years  Vaping Use   Vaping Use: Never used  Substance Use Topics   Alcohol use: Yes    Comment: socially   Drug use: No      Chart Review Today: I personally reviewed active problem list, medication list, allergies, family history, social history, health maintenance, notes from last encounter, lab results, imaging with the patient/caregiver today.   Review of Systems  Constitutional: Negative.   HENT: Negative.    Eyes: Negative.   Respiratory: Negative.    Cardiovascular: Negative.   Gastrointestinal: Negative.   Endocrine: Negative.   Genitourinary: Negative.   Musculoskeletal: Negative.   Skin: Negative.   Allergic/Immunologic: Negative.   Neurological: Negative.   Hematological: Negative.   Psychiatric/Behavioral: Negative.    All other systems reviewed and are negative.      Objective:   Vitals:   10/07/22 0910 10/07/22 1009  BP: (!) 170/84 (!) 174/78  Pulse: 90   Resp: 16   Temp: 97.9 F (36.6 C)   TempSrc: Oral   SpO2: 96%   Weight: 156 lb 8 oz (71 kg)   Height: 5\' 8"  (1.727 m)     Body mass index is 23.8 kg/m.  Physical Exam Vitals and nursing note reviewed.  Constitutional:      General: She is not in acute distress.    Appearance: Normal appearance. She is well-developed. She is not ill-appearing, toxic-appearing or diaphoretic.  HENT:     Head: Normocephalic and atraumatic.     Nose: Nose normal.  Eyes:     General: No scleral icterus.        Right eye: No discharge.        Left eye: No discharge.     Conjunctiva/sclera: Conjunctivae normal.  Neck:     Trachea: No tracheal deviation.  Cardiovascular:     Rate and Rhythm: Normal rate and regular rhythm.     Pulses: Normal pulses.     Heart sounds: Normal heart sounds. No murmur heard.    No friction rub. No gallop.  Pulmonary:     Effort: Pulmonary effort is normal. No respiratory distress.     Breath sounds: Normal breath sounds. No stridor. No wheezing, rhonchi or rales.  Musculoskeletal:  Right lower leg: No edema.     Left lower leg: No edema.  Skin:    General: Skin is warm and dry.     Coloration: Skin is not jaundiced.     Findings: No rash.  Neurological:     Mental Status: She is alert. Mental status is at baseline.     Motor: No abnormal muscle tone.     Coordination: Coordination normal.  Psychiatric:        Behavior: Behavior normal.      Results for orders placed or performed during the hospital encounter of 10/05/22  Surgical pcr screen   Specimen: Nasal Mucosa; Nasal Swab  Result Value Ref Range   MRSA, PCR NEGATIVE NEGATIVE   Staphylococcus aureus POSITIVE (A) NEGATIVE  CBC WITH DIFFERENTIAL  Result Value Ref Range   WBC 6.0 4.0 - 10.5 K/uL   RBC 4.20 3.87 - 5.11 MIL/uL   Hemoglobin 13.3 12.0 - 15.0 g/dL   HCT 16.1 09.6 - 04.5 %   MCV 98.1 80.0 - 100.0 fL   MCH 31.7 26.0 - 34.0 pg   MCHC 32.3 30.0 - 36.0 g/dL   RDW 40.9 81.1 - 91.4 %   Platelets 285 150 - 400 K/uL   nRBC 0.0 0.0 - 0.2 %   Neutrophils Relative % 61 %   Neutro Abs 3.7 1.7 - 7.7 K/uL   Lymphocytes Relative 28 %   Lymphs Abs 1.7 0.7 - 4.0 K/uL   Monocytes Relative 8 %   Monocytes Absolute 0.5 0.1 - 1.0 K/uL   Eosinophils Relative 2 %   Eosinophils Absolute 0.1 0.0 - 0.5 K/uL   Basophils Relative 1 %   Basophils Absolute 0.0 0.0 - 0.1 K/uL   Immature Granulocytes 0 %   Abs Immature Granulocytes 0.02 0.00 - 0.07 K/uL  Comprehensive metabolic panel  Result Value Ref  Range   Sodium 135 135 - 145 mmol/L   Potassium 4.1 3.5 - 5.1 mmol/L   Chloride 101 98 - 111 mmol/L   CO2 24 22 - 32 mmol/L   Glucose, Bld 84 70 - 99 mg/dL   BUN 16 8 - 23 mg/dL   Creatinine, Ser 7.82 0.44 - 1.00 mg/dL   Calcium 8.8 (L) 8.9 - 10.3 mg/dL   Total Protein 7.8 6.5 - 8.1 g/dL   Albumin 4.2 3.5 - 5.0 g/dL   AST 25 15 - 41 U/L   ALT 14 0 - 44 U/L   Alkaline Phosphatase 94 38 - 126 U/L   Total Bilirubin 0.8 0.3 - 1.2 mg/dL   GFR, Estimated >95 >62 mL/min   Anion gap 10 5 - 15  Urinalysis, Routine w reflex microscopic -Urine, Clean Catch  Result Value Ref Range   Color, Urine YELLOW (A) YELLOW   APPearance CLEAR (A) CLEAR   Specific Gravity, Urine 1.018 1.005 - 1.030   pH 5.0 5.0 - 8.0   Glucose, UA NEGATIVE NEGATIVE mg/dL   Hgb urine dipstick SMALL (A) NEGATIVE   Bilirubin Urine NEGATIVE NEGATIVE   Ketones, ur NEGATIVE NEGATIVE mg/dL   Protein, ur NEGATIVE NEGATIVE mg/dL   Nitrite NEGATIVE NEGATIVE   Leukocytes,Ua TRACE (A) NEGATIVE   RBC / HPF 0-5 0 - 5 RBC/hpf   WBC, UA 0-5 0 - 5 WBC/hpf   Bacteria, UA RARE (A) NONE SEEN   Squamous Epithelial / HPF 0-5 0 - 5 /HPF   Mucus PRESENT   Type and screen Order type and screen if day of surgery is  less than 15 days from draw of preadmission visit or order morning of surgery if day of surgery is greater than 6 days from preadmission visit.  Result Value Ref Range   ABO/RH(D) A NEG    Antibody Screen NEG    Sample Expiration 10/19/2022,2359    Extend sample reason      NO TRANSFUSIONS OR PREGNANCY IN THE PAST 3 MONTHS Performed at [facility], [address], [place], [place] [address]        Assessment & Plan:   1. Preoperative clearance Pt here for clearance -she has not been into the office in about 2 years and is not currently on any of her prior medications.  She is scheduled on June third for right total knee with Dr. Reinaldo Berber Preop labs and ECG were done on 10/05/2022 at [facility] and reviewed  today  Largest concern for pts surgical/anesthesia risk is uncontrolled HTN, but she does have multiple other significant diagnoses and she has not been into the office for the past couple years for any care whatsoever -prior medical history includes cirrhosis, emphysema, pulmonary nodules, atherosclerotic disease, coronary artery disease, hyperlipidemia, and uncontrolled hypertension Last procedure (colonoscopy) required cardiac clearance and I will refer her back to them since this will also be required  2. Hypertension, unspecified type Uncontrolled, hx of white coat htn w/o prior tx BP Readings from Last 3 Encounters:  10/07/22 (!) 174/78  10/05/22 (!) 152/68  05/21/21 (!) 144/72  Per [facility] everywhere BP readings for ortho visits have also been elevated SBP 150-160's Discussed doing some home BP monitoring when relaxed to see if there is a significant difference at home, she is going to reduce salt intake and she is willing to try amlodipine.  I explained to her that with blood pressures as high she is not currently optimized for surgery and I would not be able to state otherwise unless I verified improve blood pressure readings so she was encouraged to get a blood pressure cuff today and do multiple readings and follow-up early next week. Close f/up next with with BP readings - amLODipine (NORVASC) 5 MG tablet; Take 1 tablet (5 mg total) by mouth daily.  Dispense: 30 tablet; Refill: 1  3. Hyperlipidemia, familial, high LDL She has been on a statin and Zetia for many years but has not been taking for the past year or so she states someone told her to stop taking it (nephrology - who she last saw about a year ago - and this is not documented in their OV notes - several reviewed) Her blood pressure is high and last cholesterol reading was from 2022 I explained to her her ASCVD risk is likely to increase over time due to increased age also increased with high blood pressure.  Also the  statins will be indicated with known atherosclerotic disease.  Current ASCVD risk calculation with her prior high lipids and current BP and age is 29.0-29.3% Risk of cardiovascular event (coronary or stroke death or non-fatal MI or stroke) in next 10 years.  She will need lipids and it has been recommended that she restart statin   4. Hypo-osmolality and hyponatremia Was seeing nephrology, last OV that I can see was 11/2021, at that time she had been taken off sodium chloride tablet and furosemide Her last sodium was 135  5. Centrilobular emphysema (HCC) Previously doing lung cancer screening CTs but she has been lost to follow-up for the past couple years Reports that she stopped  smoking about 2 or 3 years ago, she denies any current respiratory symptoms Previously multiple lung nodules, groundglass opacities  6. Thoracic aortic atherosclerosis (HCC) Statin indicated -she reports not taking for the past year  7. Coronary artery disease involving native coronary artery of native heart without angina pectoris CAD noted on prior chest CT from December 2021 which specifically states severe coronary atherosclerotic calcifications Only Cardiology consult was years prior (Dr. Okey Dupre 2017)       Julie Berry, PA-C 10/07/22 9:42 AM

## 2022-10-12 ENCOUNTER — Telehealth: Payer: Self-pay | Admitting: Internal Medicine

## 2022-10-12 ENCOUNTER — Telehealth: Payer: Self-pay | Admitting: Family Medicine

## 2022-10-12 NOTE — Telephone Encounter (Signed)
Copied from CRM [id number]. Topic: General - Other >> Oct 12, 2022  1:52 PM Franchot Heidelberg wrote: Reason for CRM: Mariam called from the [facility] checking on a clearance form. Patient has surgery on 10/18/2022 Best contact: [phone] Fax: [phone]

## 2022-10-12 NOTE — Telephone Encounter (Signed)
Form has been faxed patient has not scheduled future appointment yet

## 2022-10-12 NOTE — Telephone Encounter (Signed)
   Name: Julie Beck  DOB: Nov 03, 1945  MRN: [id number]  Primary Cardiologist: None  Chart reviewed as part of pre-operative protocol coverage. Because of [name]'s past medical history and time since last visit, she will require a follow-up in-office visit in order to better assess preoperative cardiovascular risk.  Pre-op covering staff: - Please schedule appointment and call patient to inform them. If patient already had an upcoming appointment within acceptable timeframe, please add "pre-op clearance" to the appointment notes so provider is aware. - Please contact requesting surgeon's office via preferred method (i.e, phone, fax) to inform them of need for appointment prior to surgery.   Napoleon Form, Leodis Rains, NP  10/12/2022, 4:43 PM

## 2022-10-12 NOTE — Telephone Encounter (Signed)
Pt needs NEW PT APPT, LAST SEEN 2018, I will send a message to our scheduling team to see if they may be able to get the pt a new pt appt for pre op clearance. I will update the requesting office they may need to post pone procedure until the pt has been seen and cleared by cardiologist.

## 2022-10-12 NOTE — Telephone Encounter (Signed)
   Pre-operative Risk Assessment    Patient Name: Julie Beck  DOB: 1945-06-24 MRN: [id number]      Request for Surgical Clearance    Procedure:  Right TKA  Date of Surgery:  Clearance 10/18/22                                 Surgeon:  Reinaldo Berber, MD Surgeon's Group or Practice Name:  [facility] Phone number:  [phone] Fax number:  [phone]  Type of Clearance Requested:   - Medical    Type of Anesthesia:  Not Indicated   Additional requests/questions:    Signed, Narda Amber   10/12/2022, 4:39 PM

## 2022-10-13 NOTE — Telephone Encounter (Signed)
Pt has new pt appt with Dr. Azucena Cecil 11/01/22 @ 10:20. I will update all parties involved.

## 2022-10-13 NOTE — Telephone Encounter (Signed)
Surgeon office sent a message that they got the pt in sooner with [facility]. I will confirm with surgeon office that we can cancel the new pt appt that we have set with Dr. Azucena Cecil and pt will now see [facility].

## 2022-10-15 NOTE — Telephone Encounter (Signed)
See notes from Dr. Azucena Cecil. Pt is going to be followed with [facility]. I will cancel the appt that was set with Dr. Azucena Cecil.

## 2022-10-19 ENCOUNTER — Other Ambulatory Visit: Payer: Medicare Other

## 2022-10-20 MED ORDER — LACTATED RINGERS IV SOLN: INTRAVENOUS | Status: DC

## 2022-10-20 MED ORDER — DEXAMETHASONE SODIUM PHOSPHATE 10 MG/ML IJ SOLN
8.0000 mg | Freq: Once | INTRAMUSCULAR | Status: AC
  Administered 2022-10-21: 8 mg via INTRAVENOUS

## 2022-10-20 MED ORDER — TRANEXAMIC ACID-NACL 1000-0.7 MG/100ML-% IV SOLN
1000.0000 mg | INTRAVENOUS | Status: AC
  Administered 2022-10-21 (×2): 1000 mg via INTRAVENOUS

## 2022-10-20 MED ORDER — ORAL CARE MOUTH RINSE: 15.0000 mL | Freq: Once | OROMUCOSAL | Status: AC

## 2022-10-20 MED ORDER — CHLORHEXIDINE GLUCONATE 0.12 % MT SOLN
15.0000 mL | Freq: Once | OROMUCOSAL | Status: AC
  Administered 2022-10-21: 15 mL via OROMUCOSAL

## 2022-10-20 MED ORDER — FAMOTIDINE 20 MG PO TABS
20.0000 mg | ORAL_TABLET | Freq: Once | ORAL | Status: AC
  Administered 2022-10-21: 20 mg via ORAL

## 2022-10-20 MED ORDER — CEFAZOLIN SODIUM-DEXTROSE 2-4 GM/100ML-% IV SOLN
2.0000 g | INTRAVENOUS | Status: AC
  Administered 2022-10-21: 2 g via INTRAVENOUS

## 2022-10-21 ENCOUNTER — Encounter
Admission: RE | Disposition: A | Discharge status: Home Health | Payer: Self-pay | Source: Ambulatory Visit | Attending: Orthopedic Surgery

## 2022-10-21 ENCOUNTER — Encounter: Payer: Self-pay | Admitting: Orthopedic Surgery

## 2022-10-21 ENCOUNTER — Ambulatory Visit: Payer: Medicare Other

## 2022-10-21 ENCOUNTER — Ambulatory Visit: Payer: Medicare Other | Admitting: General Practice

## 2022-10-21 ENCOUNTER — Observation Stay
Admission: RE | Admit: 2022-10-21 | Discharge: 2022-10-22 | Disposition: A | Discharge status: Home Health | Payer: Medicare Other | Source: Ambulatory Visit | Attending: Orthopedic Surgery | Admitting: Orthopedic Surgery

## 2022-10-21 ENCOUNTER — Other Ambulatory Visit: Payer: Self-pay

## 2022-10-21 ENCOUNTER — Ambulatory Visit: Payer: Medicare Other | Admitting: Urgent Care

## 2022-10-21 DIAGNOSIS — Z79899 Other long term (current) drug therapy: Secondary | ICD-10-CM | POA: Diagnosis not present

## 2022-10-21 DIAGNOSIS — Z7982 Long term (current) use of aspirin: Secondary | ICD-10-CM | POA: Diagnosis not present

## 2022-10-21 DIAGNOSIS — Z96651 Presence of right artificial knee joint: Secondary | ICD-10-CM

## 2022-10-21 DIAGNOSIS — M1711 Unilateral primary osteoarthritis, right knee: Secondary | ICD-10-CM | POA: Diagnosis present

## 2022-10-21 DIAGNOSIS — I2510 Atherosclerotic heart disease of native coronary artery without angina pectoris: Secondary | ICD-10-CM | POA: Insufficient documentation

## 2022-10-21 HISTORY — PX: TOTAL KNEE ARTHROPLASTY: SHX125

## 2022-10-21 HISTORY — DX: Presence of right artificial knee joint: Z96.651

## 2022-10-21 LAB — TYPE AND SCREEN
ABO/RH(D): A NEG
Antibody Screen: NEGATIVE

## 2022-10-21 SURGERY — ARTHROPLASTY, KNEE, TOTAL
Anesthesia: Spinal | Site: Knee | Laterality: Right

## 2022-10-21 MED ORDER — EPHEDRINE SULFATE (PRESSORS) 50 MG/ML IJ SOLN
INTRAMUSCULAR | Status: DC | PRN
  Administered 2022-10-21: 10 mg via INTRAVENOUS

## 2022-10-21 MED ORDER — CEFAZOLIN SODIUM-DEXTROSE 2-4 GM/100ML-% IV SOLN
2.0000 g | Freq: Four times a day (QID) | INTRAVENOUS | Status: AC
  Administered 2022-10-21 (×2): 2 g via INTRAVENOUS
  Filled 2022-10-21 (×2): qty 100

## 2022-10-21 MED ORDER — TRANEXAMIC ACID-NACL 1000-0.7 MG/100ML-% IV SOLN
INTRAVENOUS | Status: AC
  Filled 2022-10-21: qty 100

## 2022-10-21 MED ORDER — HYDROCODONE-ACETAMINOPHEN 5-325 MG PO TABS: 1.0000 | ORAL_TABLET | ORAL | Status: DC | PRN

## 2022-10-21 MED ORDER — SODIUM CHLORIDE 0.9 % IR SOLN
Status: DC | PRN
  Administered 2022-10-21: 3000 mL

## 2022-10-21 MED ORDER — LIDOCAINE HCL (PF) 2 % IJ SOLN
INTRAMUSCULAR | Status: AC
  Filled 2022-10-21: qty 5

## 2022-10-21 MED ORDER — CHLORHEXIDINE GLUCONATE 0.12 % MT SOLN
OROMUCOSAL | Status: AC
  Filled 2022-10-21: qty 15

## 2022-10-21 MED ORDER — PHENYLEPHRINE HCL-NACL 20-0.9 MG/250ML-% IV SOLN
INTRAVENOUS | Status: DC | PRN
  Administered 2022-10-21: 25 ug/min via INTRAVENOUS

## 2022-10-21 MED ORDER — SODIUM CHLORIDE (PF) 0.9 % IJ SOLN
INTRAMUSCULAR | Status: DC | PRN
  Administered 2022-10-21: 71 mL via INTRAMUSCULAR

## 2022-10-21 MED ORDER — KETOROLAC TROMETHAMINE 15 MG/ML IJ SOLN
INTRAMUSCULAR | Status: AC
  Filled 2022-10-21: qty 1

## 2022-10-21 MED ORDER — ONDANSETRON HCL 4 MG/2ML IJ SOLN
INTRAMUSCULAR | Status: DC | PRN
  Administered 2022-10-21: 4 mg via INTRAVENOUS

## 2022-10-21 MED ORDER — BUPIVACAINE HCL (PF) 0.5 % IJ SOLN
INTRAMUSCULAR | Status: DC | PRN
  Administered 2022-10-21: 2.5 mL

## 2022-10-21 MED ORDER — ONDANSETRON HCL 4 MG PO TABS: 4.0000 mg | ORAL_TABLET | Freq: Four times a day (QID) | ORAL | Status: DC | PRN

## 2022-10-21 MED ORDER — DOCUSATE SODIUM 100 MG PO CAPS
100.0000 mg | ORAL_CAPSULE | Freq: Two times a day (BID) | ORAL | Status: DC
  Administered 2022-10-21 – 2022-10-22 (×2): 100 mg via ORAL
  Filled 2022-10-21 (×2): qty 1

## 2022-10-21 MED ORDER — ONDANSETRON HCL 4 MG/2ML IJ SOLN
INTRAMUSCULAR | Status: AC
  Filled 2022-10-21: qty 2

## 2022-10-21 MED ORDER — PROPOFOL 500 MG/50ML IV EMUL
INTRAVENOUS | Status: DC | PRN
  Administered 2022-10-21: 80 ug/kg/min via INTRAVENOUS

## 2022-10-21 MED ORDER — OXYCODONE HCL 5 MG PO TABS
ORAL_TABLET | ORAL | Status: AC
  Filled 2022-10-21: qty 1

## 2022-10-21 MED ORDER — PANTOPRAZOLE SODIUM 40 MG PO TBEC
40.0000 mg | DELAYED_RELEASE_TABLET | Freq: Every day | ORAL | Status: DC
  Administered 2022-10-21 – 2022-10-22 (×2): 40 mg via ORAL
  Filled 2022-10-21 (×2): qty 1

## 2022-10-21 MED ORDER — PROPOFOL 10 MG/ML IV BOLUS
INTRAVENOUS | Status: AC
  Filled 2022-10-21: qty 20

## 2022-10-21 MED ORDER — SURGIPHOR WOUND IRRIGATION SYSTEM - OPTIME: TOPICAL | Status: DC | PRN

## 2022-10-21 MED ORDER — SODIUM CHLORIDE 0.9 % IV SOLN: INTRAVENOUS | Status: DC

## 2022-10-21 MED ORDER — DEXAMETHASONE SODIUM PHOSPHATE 10 MG/ML IJ SOLN
INTRAMUSCULAR | Status: AC
  Filled 2022-10-21: qty 1

## 2022-10-21 MED ORDER — OXYCODONE HCL 5 MG PO TABS
5.0000 mg | ORAL_TABLET | Freq: Once | ORAL | Status: AC | PRN
  Administered 2022-10-21: 5 mg via ORAL

## 2022-10-21 MED ORDER — CEFAZOLIN SODIUM-DEXTROSE 2-4 GM/100ML-% IV SOLN
INTRAVENOUS | Status: AC
  Filled 2022-10-21: qty 100

## 2022-10-21 MED ORDER — MORPHINE SULFATE (PF) 4 MG/ML IV SOLN: 0.5000 mg | INTRAVENOUS | Status: DC | PRN

## 2022-10-21 MED ORDER — FAMOTIDINE 20 MG PO TABS
ORAL_TABLET | ORAL | Status: AC
  Filled 2022-10-21: qty 1

## 2022-10-21 MED ORDER — ENOXAPARIN SODIUM 30 MG/0.3ML IJ SOSY
30.0000 mg | PREFILLED_SYRINGE | Freq: Two times a day (BID) | INTRAMUSCULAR | Status: DC
  Administered 2022-10-22: 30 mg via SUBCUTANEOUS
  Filled 2022-10-21: qty 0.3

## 2022-10-21 MED ORDER — LIDOCAINE HCL (CARDIAC) PF 100 MG/5ML IV SOSY
PREFILLED_SYRINGE | INTRAVENOUS | Status: DC | PRN
  Administered 2022-10-21: 30 mg via INTRAVENOUS

## 2022-10-21 MED ORDER — PHENOL 1.4 % MT LIQD: 1.0000 | OROMUCOSAL | Status: DC | PRN

## 2022-10-21 MED ORDER — TRANEXAMIC ACID 1000 MG/10ML IV SOLN
INTRAVENOUS | Status: AC
  Filled 2022-10-21: qty 10

## 2022-10-21 MED ORDER — ACETAMINOPHEN 500 MG PO TABS
1000.0000 mg | ORAL_TABLET | Freq: Three times a day (TID) | ORAL | Status: DC
  Administered 2022-10-21 – 2022-10-22 (×3): 1000 mg via ORAL
  Filled 2022-10-21 (×3): qty 2

## 2022-10-21 MED ORDER — METOCLOPRAMIDE HCL 5 MG PO TABS: 5.0000 mg | ORAL_TABLET | Freq: Three times a day (TID) | ORAL | Status: DC | PRN

## 2022-10-21 MED ORDER — ONDANSETRON HCL 4 MG/2ML IJ SOLN: 4.0000 mg | Freq: Four times a day (QID) | INTRAMUSCULAR | Status: DC | PRN

## 2022-10-21 MED ORDER — TRAMADOL HCL 50 MG PO TABS
50.0000 mg | ORAL_TABLET | Freq: Four times a day (QID) | ORAL | Status: DC | PRN
  Administered 2022-10-21: 50 mg via ORAL
  Filled 2022-10-21: qty 1

## 2022-10-21 MED ORDER — KETOROLAC TROMETHAMINE 15 MG/ML IJ SOLN
7.5000 mg | Freq: Four times a day (QID) | INTRAMUSCULAR | Status: AC
  Administered 2022-10-21 – 2022-10-22 (×4): 7.5 mg via INTRAVENOUS
  Filled 2022-10-21 (×3): qty 1

## 2022-10-21 MED ORDER — PROPOFOL 10 MG/ML IV BOLUS
INTRAVENOUS | Status: DC | PRN
  Administered 2022-10-21: 40 mg via INTRAVENOUS
  Administered 2022-10-21: 30 mg via INTRAVENOUS

## 2022-10-21 MED ORDER — MENTHOL 3 MG MT LOZG: 1.0000 | LOZENGE | OROMUCOSAL | Status: DC | PRN

## 2022-10-21 MED ORDER — PHENYLEPHRINE HCL-NACL 20-0.9 MG/250ML-% IV SOLN
INTRAVENOUS | Status: AC
  Filled 2022-10-21: qty 250

## 2022-10-21 MED ORDER — METOCLOPRAMIDE HCL 5 MG/ML IJ SOLN: 5.0000 mg | Freq: Three times a day (TID) | INTRAMUSCULAR | Status: DC | PRN

## 2022-10-21 MED ORDER — BUPIVACAINE HCL (PF) 0.5 % IJ SOLN
INTRAMUSCULAR | Status: AC
  Filled 2022-10-21: qty 10

## 2022-10-21 MED ORDER — EPHEDRINE 5 MG/ML INJ
INTRAVENOUS | Status: AC
  Filled 2022-10-21: qty 5

## 2022-10-21 MED ORDER — OXYCODONE HCL 5 MG/5ML PO SOLN: 5.0000 mg | Freq: Once | ORAL | Status: AC | PRN

## 2022-10-21 MED ORDER — FENTANYL CITRATE (PF) 100 MCG/2ML IJ SOLN: 25.0000 ug | INTRAMUSCULAR | Status: DC | PRN

## 2022-10-21 SURGICAL SUPPLY — 75 items
ADH SKN CLS APL DERMABOND .7 (GAUZE/BANDAGES/DRESSINGS) ×1
APL PRP STRL LF DISP 70% ISPRP (MISCELLANEOUS) ×2
BLADE PATELLA REAM PILOT HOLE (MISCELLANEOUS) IMPLANT
BLADE SAW SAG 25X90X1.19 (BLADE) ×1 IMPLANT
BLADE SAW SAG 29X58X.64 (BLADE) ×1 IMPLANT
BOWL CEMENT MIX W/ADAPTER (MISCELLANEOUS) ×1 IMPLANT
BSPLAT TIB 5D E CMNT KN RT (Knees) ×1 IMPLANT
CEMENT BONE R 1X40 (Cement) ×2 IMPLANT
CHLORAPREP W/TINT 26 (MISCELLANEOUS) ×2 IMPLANT
COOLER POLAR GLACIER W/PUMP (MISCELLANEOUS) ×1 IMPLANT
CUFF TOURN SGL QUICK 24 (TOURNIQUET CUFF)
CUFF TOURN SGL QUICK 34 (TOURNIQUET CUFF)
CUFF TRNQT CYL 24X4X16.5-23 (TOURNIQUET CUFF) IMPLANT
CUFF TRNQT CYL 34X4.125X (TOURNIQUET CUFF) IMPLANT
DERMABOND ADVANCED .7 DNX12 (GAUZE/BANDAGES/DRESSINGS) ×1 IMPLANT
DRAPE 3/4 80X56 (DRAPES) ×1 IMPLANT
DRAPE INCISE IOBAN 66X60 STRL (DRAPES) IMPLANT
DRSG MEPILEX SACRM 8.7X9.8 (GAUZE/BANDAGES/DRESSINGS) ×1 IMPLANT
DRSG OPSITE POSTOP 4X10 (GAUZE/BANDAGES/DRESSINGS) IMPLANT
DRSG OPSITE POSTOP 4X8 (GAUZE/BANDAGES/DRESSINGS) IMPLANT
ELECT REM PT RETURN 9FT ADLT (ELECTROSURGICAL) ×1
ELECTRODE REM PT RTRN 9FT ADLT (ELECTROSURGICAL) ×1 IMPLANT
FEMUR CMT CR STD SZ 8 RT KNEE (Joint) ×1 IMPLANT
FEMUR CMTD CR STD SZ 8 RT KNEE (Joint) IMPLANT
GLOVE BIO SURGEON STRL SZ8 (GLOVE) ×1 IMPLANT
GLOVE BIOGEL PI IND STRL 8 (GLOVE) ×1 IMPLANT
GLOVE PI ORTHO PRO STRL 7.5 (GLOVE) ×2 IMPLANT
GLOVE PI ORTHO PRO STRL SZ8 (GLOVE) ×2 IMPLANT
GOWN SRG XL LVL 3 NONREINFORCE (GOWNS) ×1 IMPLANT
GOWN STRL NON-REIN TWL XL LVL3 (GOWNS) ×1
GOWN STRL REUS W/ TWL LRG LVL3 (GOWN DISPOSABLE) ×1 IMPLANT
GOWN STRL REUS W/ TWL XL LVL3 (GOWN DISPOSABLE) ×1 IMPLANT
GOWN STRL REUS W/TWL LRG LVL3 (GOWN DISPOSABLE) ×1
GOWN STRL REUS W/TWL XL LVL3 (GOWN DISPOSABLE) ×1
HANDLE YANKAUER SUCT OPEN TIP (MISCELLANEOUS) ×1 IMPLANT
HOOD PEEL AWAY T7 (MISCELLANEOUS) ×2 IMPLANT
INSERT TIB ARTISURF SZ8-11X12 (Insert) IMPLANT
IV NS IRRIG 3000ML ARTHROMATIC (IV SOLUTION) ×1 IMPLANT
KIT TURNOVER KIT A (KITS) ×1 IMPLANT
MANIFOLD NEPTUNE II (INSTRUMENTS) ×1 IMPLANT
MARKER SKIN DUAL TIP RULER LAB (MISCELLANEOUS) ×1 IMPLANT
MAT ABSORB FLUID 56X50 GRAY (MISCELLANEOUS) ×1 IMPLANT
NDL FILTER BLUNT 18X1 1/2 (NEEDLE) ×1 IMPLANT
NDL HYPO 21X1.5 SAFETY (NEEDLE) ×1 IMPLANT
NDL SAFETY ECLIP 18X1.5 (MISCELLANEOUS) ×1 IMPLANT
NEEDLE FILTER BLUNT 18X1 1/2 (NEEDLE) ×1 IMPLANT
NEEDLE HYPO 21X1.5 SAFETY (NEEDLE) ×1 IMPLANT
PACK TOTAL KNEE (MISCELLANEOUS) ×1 IMPLANT
PAD ARMBOARD 7.5X6 YLW CONV (MISCELLANEOUS) ×3 IMPLANT
PAD WRAPON POLAR KNEE (MISCELLANEOUS) ×1 IMPLANT
PENCIL SMOKE EVACUATOR (MISCELLANEOUS) ×1 IMPLANT
PIN DRILL HDLS TROCAR 75 4PK (PIN) IMPLANT
PULSAVAC PLUS IRRIG FAN TIP (DISPOSABLE) ×1
SCREW FEMALE HEX FIX 25X2.5 (ORTHOPEDIC DISPOSABLE SUPPLIES) IMPLANT
SCREW HEX HEADED 3.5X27 DISP (ORTHOPEDIC DISPOSABLE SUPPLIES) IMPLANT
SLEEVE SCD COMPRESS KNEE MED (STOCKING) ×1 IMPLANT
SOLUTION IRRIG SURGIPHOR (IV SOLUTION) ×1 IMPLANT
STEM POLY PAT PLY 32M KNEE (Knees) IMPLANT
STEM TIB ST PERS 14+30 (Stem) IMPLANT
STEM TIBIA 5 DEG SZ E R KNEE (Knees) IMPLANT
SUT DVC 2 QUILL PDO T11 36X36 (SUTURE) ×1 IMPLANT
SUT QUILL MONODERM 3-0 PS-2 (SUTURE) ×1 IMPLANT
SUT VIC AB 0 CT1 36 (SUTURE) ×1 IMPLANT
SUT VIC AB 2-0 CT2 27 (SUTURE) ×2 IMPLANT
SUT VICRYL 1-0 27IN ABS (SUTURE) ×1
SUTURE VICRYL 1-0 27IN ABS (SUTURE) ×1 IMPLANT
SYR 30ML LL (SYRINGE) ×2 IMPLANT
SYR TB 1ML LL NO SAFETY (SYRINGE) ×1 IMPLANT
TAPE CLOTH 3X10 WHT NS LF (GAUZE/BANDAGES/DRESSINGS) ×1 IMPLANT
TIBIA STEM 5 DEG SZ E R KNEE (Knees) ×1 IMPLANT
TIP FAN IRRIG PULSAVAC PLUS (DISPOSABLE) ×1 IMPLANT
TOWEL OR 17X26 4PK STRL BLUE (TOWEL DISPOSABLE) IMPLANT
TRAP FLUID SMOKE EVACUATOR (MISCELLANEOUS) ×1 IMPLANT
WATER STERILE IRR 1000ML POUR (IV SOLUTION) ×1 IMPLANT
WRAPON POLAR PAD KNEE (MISCELLANEOUS) ×1

## 2022-10-21 NOTE — H&P (Signed)
History of Present Illness: The patient is an 77 y.o. female seen in clinic today for evaluation of her right knee prior to right total knee replacement surgery. The patient has continuing ongoing functional disability from her right knee pain for the past 3 to 4 years. She is undergone multiple steroid injections and gel injections with minimal to no relief of her pain. She reports constant throbbing with sharp pains over the medial aspect of her right knee up to a 5 out of 10 at baseline and 10 out of 10 with activity. She has had minimal improvement with anti-inflammatory treatment and brace use. She is gone through exercise programs without improvement in her function and has sensations of her knee giving out when she walks. At this point she feels that her conservative measures are no longer improving her knee function and is interested in moving forward with surgery for her knee. She does have a history of knee arthroscopy in the remote past with a partial meniscectomy in the medial side of her right knee. The patient denies fevers, chills, numbness, tingling, shortness of breath, chest pain, recent illness, or any trauma.  The patient does not use any tobacco products and quit smoking 3 years ago, she is a BMI of 24.5, she is nondiabetic with her last hemoglobin A1c checked at 5.7  Past Medical History: Past Medical History:  Diagnosis Date  Hyperlipidemia  Tobacco use   Past Surgical History: History reviewed. No pertinent surgical history.  Past Family History: History reviewed. No pertinent family history.  Medications: Current Outpatient Medications  Medication Sig Dispense Refill  aspirin 81 MG EC tablet Take by mouth.  atorvastatin (LIPITOR) 40 MG tablet Take by mouth. (Patient not taking: Reported on 10/06/2022)  CALCIUM CARB/VIT D3/MINERALS (CALCIUM-VITAMIN D ORAL) Take by mouth.  ezetimibe (ZETIA) 10 mg tablet  multivitamin tablet Take by mouth  MV-MN/FOLIC ACID/CALCIUM/VIT K  (ONE-A-DAY WOMEN'S 50 PLUS ORAL) Take by mouth.  omeprazole (PRILOSEC) 20 MG DR capsule TAKE 1 CAPSULE BY MOUTH DAILY (Patient not taking: Reported on 10/06/2022) 30 capsule 0   No current facility-administered medications for this visit.   Allergies: Allergies  Allergen Reactions  Sulfa (Sulfonamide Antibiotics) Hives  Sulfasalazine Hives    Visit Vitals: Vitals:  10/06/22 1136  BP: (!) 160/66    Review of Systems:  A comprehensive 14 point ROS was performed, reviewed, and the pertinent orthopaedic findings are documented in the HPI. Physical Exam: General/Constitutional: No apparent distress: well-nourished and well developed. Eyes: Pupils equal, round with synchronous movement. Pulmonary exam: Lungs clear to auscultation bilaterally no wheezing rales or rhonchi Cardiac exam: Regular rate and rhythm no obvious murmurs rubs or gallops. Integumentary: No impressive skin lesions present, except as noted in detailed exam. Neuro/Psych: Normal mood and affect, oriented to person, place and time.  Comprehensive Knee Exam: Gait Non-antalgic and fluid  Alignment Neutral   Inspection Right Left  Skin Normal appearance with no obvious deformity. Healed arthroscopic portals. No ecchymosis or erythema. Normal appearance with no obvious deformity. No ecchymosis or erythema.  Soft Tissue No focal soft tissue swelling No focal soft tissue swelling  Quad Atrophy None None   Palpation  Right Left  Tenderness Medial joint line tenderness to palpation No peripatellar, patellar tendon, quad tendon, medial/lateral joint line pain  Crepitus + patellofemoral and tibiofemoral crepitus No patellofemoral or tibiofemoral crepitus  Effusion None None   Range of Motion Right Left  Flexion 0-120 Normal AROM  Extension Full knee extension without hyperextension Full  knee extension without hyperextension   Ligamentous Exam Right Left  Lachman Normal Normal  Valgus 0 Normal Normal  Valgus 30  Normal Normal  Varus 0 Normal Normal  Varus 30 Normal Normal  Anterior Drawer Normal Normal  Posterior Drawer Normal Normal   Meniscal Exam Right Left  Hyperflexion Test Positive Negative  Hyperextension Test Positive Negative  McMurray's Positive Negative    Neurovascular Right Left  Quadriceps Strength 5/5 5/5  Hamstring Strength 5/5 5/5  Hip Abductor Strength 4/5 4/5  Distal Motor Normal Normal  Distal Sensory Normal light touch sensation Normal light touch sensation  Distal Pulses Normal Normal    Imaging Studies: I have reviewed AP, lateral,sunrise, and flexed PA weight bearing knee X-rays (4 views) of the right knee taken at the last office visit show severe degenerative changes with medial and patellofemoral joint space narrowing with medial bone-on-bone arthritis, osteophyte formation, subchondral cysts and sclerosis. There is lateral subluxation of the tibia relative to the femur on the right knee. The right knee is Kellgren-Lawrence grade 4. AP, sunrise, and flexed PA of the left knee also show medial joint space narrowing with proximal tibial sclerosis and some degenerative changes to the patellofemoral articulation Kellgren-Lawrence grade 2.   Assessment:  Right knee osteoarthritis  Plan: Julie Beck is a 77 year old female who presents with right knee bone on bone arthritis. Based upon the patient's continued symptoms and failure to respond to conservative treatment, I have recommended a right total knee replacement for this patient. A long discussion took place with the patient describing what a total joint replacement is and what the procedure would entail. A knee model, similar to the implants that will be used during the operation, was utilized to demonstrate the implants. Choices of implant manufactures were discussed and reviewed. The ability to secure the implant utilizing cement or cementless (press fit) fixation was discussed. The approach and exposure was discussed.    The hospitalization and post-operative care and rehabilitation were also discussed. The use of perioperative antibiotics and DVT prophylaxis were discussed. The risk, benefits and alternatives to a surgical intervention were discussed at length with the patient. The patient was also advised of risks related to the medical comorbidities and elevated body mass index (BMI). A lengthy discussion took place to review the most common complications including but not limited to: stiffness, loss of function, complex regional pain syndrome, deep vein thrombosis, pulmonary embolus, heart attack, stroke, infection, wound breakdown, numbness, intraoperative fracture, damage to nerves, tendon,muscles, arteries or other blood vessels, death and other possible complications from anesthesia. The patient was told that we will take steps to minimize these risks by using sterile technique, antibiotics and DVT prophylaxis when appropriate and follow the patient postoperatively in the office setting to monitor progress. The possibility of recurrent pain, no improvement in pain and actual worsening of pain were also discussed with the patient.   The discharge plan of care focused on the patient going home following surgery. The patient was encouraged to make the necessary arrangements to have someone stay with them when they are discharged home.   The benefits of surgery were discussed with the patient including the potential for improving the patient's current clinical condition through operative intervention. Alternatives to surgical intervention including continued conservative management were also discussed in detail. All questions were answered to the satisfaction of the patient. The patient participated and agreed to the plan of care as well as the use of the recommended implants for their total knee replacement surgery.  An information packet was given to the patient to review prior to surgery.   Patient received medical  clearance for surgery. We are now more than 3 months out from her last gel injection and she is safe to proceed with surgery. All questions answered the patient agrees the above plan to make preparations for a right total knee replacement.    Portions of this record have been created using Scientist, clinical (histocompatibility and immunogenetics). Dictation errors have been sought, but may not have been identified and corrected.  Reinaldo Berber MD

## 2022-10-21 NOTE — Op Note (Signed)
Patient Name: Julie Beck  NWG:[id number]  Pre-Operative Diagnosis: Right knee Osteoarthritis  Post-Operative Diagnosis: (same)  Procedure: Right Total Knee Arthroplasty  Components/Implants: Femur: Persona Size 8 CR   Tibia: Persona Size E w/ 14x71mm stem extension  Poly: 12mm MC  Patella: 32mm x8.12mm symmetric button  Femoral Valgus Cut Angle: 5 degree  Distal Femoral Re-cut: none  Patella Resurfacing: yes   Date of Surgery: 10/21/2022  Surgeon: Reinaldo Berber MD  Assistant: Amador Cunas PA (present and scrubbed throughout the case, critical for assistance with exposure, retraction, instrumentation, and closure)   Anesthesiologist: Lorette Ang  Anesthesia: Spinal   Tourniquet Time: 57 min  EBL: 25cc  IVF: 700cc  Complications: None   Brief history: The patient is a 77 year old female with a history of osteoarthritis of the right knee with pain limiting their range of motion and activities of daily living, which has failed multiple attempts at conservative therapy.  The risks and benefits of total knee arthroplasty as definitive surgical treatment were discussed with the patient, who opted to proceed with the operation.  After outpatient medical clearance and optimization was completed the patient was admitted to [facility] for the procedure.  All preoperative films were reviewed and an appropriate surgical plan was made prior to surgery. Preoperative range of motion was 0 to 120 with a no flexion contracture. The patient was identified as having a varus alignment.   Description of procedure: The patient was brought to the operating room where laterality was confirmed by all those present to be the right side.   Spinal anesthesia was administered and the patient received an intravenous dose of antibiotics for surgical prophylaxis and a dose of tranexamic acid.  Patient is positioned supine on the operating room table with all bony prominences well-padded.   A well-padded tourniquet was applied to the right thigh.  The knee was then prepped and draped in usual sterile fashion with multiple layers of adhesive and nonadhesive drapes.  All of those present in the operating room participated in a surgical timeout laterality and patient were confirmed.   An Esmarch was wrapped around the extremity and the leg was elevated and the knee flexed.  The tourniquet was inflated to a pressure of 275 mmHg. The Esmarch was removed and the leg was brought down to full extension.  The patella and tibial tubercle identified and outlined using a marking pen and a midline skin incision was made with a knife carried through the subcutaneous tissue down to the extensor retinaculum.  After exposure of the extensor mechanism the medial parapatellar arthrotomy was performed with a scalpel and electrocautery extending down medial and distal to the tibial tubercle taking care to avoid incising the patellar tendon.   A standard medial release was performed over the proximal tibia.  The knee was brought into extension in order to excise the fat pad taking care not to damage the patella tendon.  The superior soft tissue was removed from the anterior surface of the distal femur to visualize for the procedure.  The knee was then brought into flexion with the patella subluxed laterally and subluxing the tibia anteriorly.  The ACL was transected and removed with electrocautery and additional soft tissue was removed from the proximal surface of the tibia to fully expose. The PCL was found to be intact and was preserved.  An extramedullary tibial cutting guide was then applied to the leg with a spring-loaded ankle clamp placed around the distal tibia just above the malleoli the  angulation of the guide was adjusted to give some posterior slope in the tibial resection with an appropriate varus/valgus alignment.  The resection guide was then pinned to the proximal tibia and the proximal tibial surface  was resected with an oscillating saw.  Careful attention was paid to ensure the blade did not disrupt any of the soft tissues including any lateral or medial ligament.  Attention was then turned to the femur, with the knee slightly flexed a opening drill was used to enter the medullary canal of the femur.  After removing the drill marrow was suctioned out to decompress the distal femur.  An intramedullary femoral guide was then inserted into the drill hole and the alignment guide was seated firmly against the distal end of the medial femoral condyle.  The distal femoral cutting guide was then attached and pinned securely to the anterior surface of the femur and the intramedullary rod and alignment guide was removed.  Distal femur resection was then performed with an oscillating saw with retractors protecting medial and laterally.   The distal cutting block was then removed and the extension gap was checked with a spacer.  Extension gap was found to be appropriately sized to accommodate the spacer block.   The femoral sizing guide was then placed securely into the posterior condyles of the femur and the femoral size was measured and determined to be 8.  The size 8; 4-in-1 cutting guide was placed in position and secured with 2 pins.  The anterior posterior and chamfer resections were then performed with an oscillating saw.  Bony fragments and osteophytes were then removed.  Using a lamina spreader the posterior medial and lateral condyles were checked for additional osteophytes and posterior soft tissue remnants.  Any remaining meniscus was removed at this time.  Periarticular injection was performed in the meniscal rims and posterior capsule with aspiration performed to ensure no intravascular injection.   The tibia was then exposed and the tibial trial was pinned onto the plateau after confirming appropriate orientation and rotation.  Using the drill bushing the tibia was prepared to the appropriate drill  depth.  Tibial broach impactor was then driven through the punch guide using a mallet.  The femoral trial component was then inserted onto the femur.  A trial tibial polyethylene bearing was then placed and the knee was reduced.  The knee achieved full extension with no hyperextension and was found to be balanced in flexion and extension with the trials in place.  The knee was then brought into full extension the patella was everted and held with 2 Kocher clamps.  The articular surface of the patella was then resected with an patella reamer and saw after careful measurement with a caliper.  The patella was then prepared with the drill guide and a trial patella was placed.  The knee was then taken through range of motion and it was found that the patella articulated appropriately with the trochlea and good patellofemoral motion without subluxation.   The correct final components for implantation were confirmed and opened by the circulator nurse.  The prepared surfaces of the patella femur and tibia were cleaned with pulsatile lavage to remove all blood fat and other material and then the surfaces were dried.  2 bags of cement were mixed under vacuum and the components were cemented into place.  Excess cement was removed with curettes and forceps. A trial polyethylene tibial component was placed and the knee was brought into extension to allow the cement  to set.  At this time the periarticular injection cocktail was placed in the soft tissues surrounding the knee.  After full curing of the cement the balance of the knee was checked again and the final polyethylene size was confirmed. The tibial component was irrigated and locking mechanism checked to ensure it was clear of debris. The real polyethylene tibial component was implanted and the knee was brought through a range of motion.   The knee was then irrigated with copious amount of normal saline via pulsatile lavage to remove all loose bodies and other debris.   The knee was then irrigated with surgiphor betadine based wash and reirrigated with saline.  The tourniquet was then dropped and all bleeding vessels were identified and coagulated.  The arthrotomy was approximated with #1 Vicryl and closed with #2 Quill suture.  The knee was brought into slight flexion and the subcutaneous tissues were closed with 0 Vicryl, 2-0 Vicryl and a running subcuticular 3-0 monoderm quil suture.  Skin was then glued with Dermabond.  A sterile adhesive dressing was then placed along with a sequential compression device to the calf, a Ted stocking, and a cryotherapy cuff.   Sponge, needle, and Lap counts were all correct at the end of the case.   The patient was transferred off of the operating room table to a hospital bed, good pulses were found distally on the operative side.  The patient was transferred to the recovery room in stable condition.

## 2022-10-21 NOTE — Anesthesia Preprocedure Evaluation (Signed)
Anesthesia Evaluation  Patient identified by MRN, date of birth, ID band Patient awake    Reviewed: Allergy & Precautions, H&P , NPO status , Patient's Chart, lab work & pertinent test results  History of Anesthesia Complications Negative for: history of anesthetic complications  Airway Mallampati: III  TM Distance: >3 FB Neck ROM: full    Dental  (+) Dental Advidsory Given, Poor Dentition   Pulmonary neg shortness of breath, COPD, former smoker   Pulmonary exam normal        Cardiovascular Exercise Tolerance: Good (-) angina + CAD  (-) DOE Normal cardiovascular exam     Neuro/Psych negative neurological ROS  negative psych ROS   GI/Hepatic Neg liver ROS,GERD  Medicated and Controlled,,  Endo/Other  negative endocrine ROS    Renal/GU negative Renal ROS  negative genitourinary   Musculoskeletal  (+) Arthritis ,    Abdominal   Peds  Hematology negative hematology ROS (+)   Anesthesia Other Findings Past Medical History: 07/12/2016: Abnormal CT of liver     Comment:  Chest CT Feb 2018 No date: Arthritis 07/12/2016: Centrilobular emphysema [facility])     Comment:  Chest CT Feb 2018 07/12/2016: Coronary artery disease     Comment:  Followed by Dr. Okey Dupre, seen on chest CT Feb 2018 03/17/2016: Epigastric abdominal pain No date: Hyperlipidemia 03/17/2016: Overweight (BMI 25.0-29.9) 07/12/2016: Thoracic aortic atherosclerosis (HCC)     Comment:  Chest CT scan Feb 2018 03/17/2016: Tobacco abuse  Past Surgical History: 11/28/2017: COLONOSCOPY WITH PROPOFOL; N/A     Comment:  Procedure: COLONOSCOPY WITH PROPOFOL;  Surgeon: Toney Reil, MD;  Location: [facility] ENDOSCOPY;  Service:               Gastroenterology;  Laterality: N/A; No date: INGUINAL HERNIA REPAIR; Bilateral No date: KNEE ARTHROSCOPY; Bilateral No date: TUBAL LIGATION  BMI    Body Mass Index: 21.74 kg/m       Reproductive/Obstetrics negative OB ROS                             Anesthesia Physical Anesthesia Plan  ASA: III  Anesthesia Plan: Spinal   Post-op Pain Management:    Induction: Intravenous  PONV Risk Score and Plan: Propofol infusion and TIVA  Airway Management Planned: Natural Airway and Nasal Cannula  Additional Equipment:   Intra-op Plan:   Post-operative Plan:   Informed Consent: I have reviewed the patients History and Physical, chart, labs and discussed the procedure including the risks, benefits and alternatives for the proposed anesthesia with the patient or authorized representative who has indicated his/her understanding and acceptance.     Dental Advisory Given  Plan Discussed with: Anesthesiologist, CRNA and Surgeon  Anesthesia Plan Comments: (Patient reports no bleeding problems and no anticoagulant use.  Plan for spinal with backup GA  Patient consented for risks of anesthesia including but not limited to:  - adverse reactions to medications - damage to eyes, teeth, lips or other oral mucosa - nerve damage due to positioning  - risk of bleeding, infection and or nerve damage from spinal that could lead to paralysis - risk of headache or failed spinal - damage to teeth, lips or other oral mucosa - sore throat or hoarseness - damage to heart, brain, nerves, lungs, other parts of body or loss of life  Patient voiced understanding.)  Anesthesia Quick Evaluation

## 2022-10-21 NOTE — Interval H&P Note (Signed)
Patient history and physical updated. Consent reviewed including risks, benefits, and alternatives to surgery. Patient agrees with above plan to proceed with right total knee arthroplasty  

## 2022-10-21 NOTE — Transfer of Care (Signed)
Immediate Anesthesia Transfer of Care Note  Patient: Julie Beck  Procedure(s) Performed: TOTAL KNEE ARTHROPLASTY (Right: Knee)  Patient Location: PACU  Anesthesia Type:Spinal  Level of Consciousness: awake  Airway & Oxygen Therapy: Patient Spontanous Breathing  Post-op Assessment: Report given to RN and Post -op Vital signs reviewed and stable  Post vital signs: Reviewed and stable  Last Vitals:  Vitals Value Taken Time  BP 123/67 10/21/22 1223  Temp    Pulse 58 10/21/22 1227  Resp 14 10/21/22 1227  SpO2 97 % 10/21/22 1227  Vitals shown include unvalidated device data.  Last Pain:  Vitals:   10/21/22 0845  TempSrc: Temporal  PainSc: 0-No pain         Complications: No notable events documented.

## 2022-10-21 NOTE — Evaluation (Signed)
Physical Therapy Evaluation Patient Details Name: Julie Beck MRN: [id number] DOB: 02-06-1946 Today's Date: 10/21/2022  History of Present Illness  77 y/o female s/p R TKA 10/21/22.  Clinical Impression  Pt eager to work with PT and ultimately did very well with POD0 session.  She showed good quad set and confident SLR, she was able to AROM 0-110 with minimal c/o pain, she ambulated ~100 ft with RW showing increased confidence and consistency of cadence with increased cues and distance and overall did quite well.  Continue with PT per TKA protocols.      Recommendations for follow up therapy are one component of a multi-disciplinary discharge planning process, led by the attending physician.  Recommendations may be updated based on patient status, additional functional criteria and insurance authorization.  Follow Up Recommendations       Assistance Recommended at Discharge Intermittent Supervision/Assistance  Patient can return home with the following  A little help with walking and/or transfers;A little help with bathing/dressing/bathroom;Assistance with cooking/housework;Assist for transportation;Help with stairs or ramp for entrance    Equipment Recommendations Rolling walker (2 wheels);BSC/3in1  Recommendations for Other Services       Functional Status Assessment Patient has had a recent decline in their functional status and demonstrates the ability to make significant improvements in function in a reasonable and predictable amount of time.     Precautions / Restrictions Precautions Precautions: Fall Restrictions Weight Bearing Restrictions: Yes RLE Weight Bearing: Weight bearing as tolerated      Mobility  Bed Mobility Overal bed mobility: Modified Independent             General bed mobility comments: easily transitions to EOB w/o assist    Transfers Overall transfer level: Needs assistance Equipment used: Rolling walker (2 wheels) Transfers: Sit to/from  Stand Sit to Stand: Min guard           General transfer comment: Pt needing extra cuing for hand placement, set up and sequencing.  Heavy UEs to rise from standard height bed but    Ambulation/Gait Ambulation/Gait assistance: Min guard Gait Distance (Feet): 100 Feet Assistive device: Rolling walker (2 wheels)         General Gait Details: Pt with initial expected hesitation with R LE weight acceptance and increased UE reliance, gait training cues and increased distance improve her cadence and confidence.  Stairs            Wheelchair Mobility    Modified Rankin (Stroke Patients Only)       Balance                                             Pertinent Vitals/Pain Pain Assessment Pain Assessment: 0-10 Pain Score: 3  Pain Location: R knee Pain Intervention(s): Premedicated before session, Limited activity within patient's tolerance    Home Living Family/patient expects to be discharged to:: Private residence Living Arrangements: Alone Available Help at Discharge: Family;Available 24 hours/day Type of Home: House Home Access: Stairs to enter Entrance Stairs-Rails: Right Entrance Stairs-Number of Steps: 5   Home Layout: One level Home Equipment: None      Prior Function Prior Level of Function : Independent/Modified Independent;Working/employed             Mobility Comments: limping more recently, but generally able to be active and independent, works as Producer, television/film/video, standing for hours at a  time       Hand Dominance        Extremity/Trunk Assessment   Upper Extremity Assessment Upper Extremity Assessment: Overall WFL for tasks assessed    Lower Extremity Assessment Lower Extremity Assessment: Overall WFL for tasks assessed (expected post-op weakness with good QS and SLR)       Communication   Communication: No difficulties  Cognition Arousal/Alertness: Awake/alert Behavior During Therapy: WFL for tasks  assessed/performed Overall Cognitive Status: Within Functional Limits for tasks assessed                                          General Comments General comments (skin integrity, edema, etc.): Pt did very well with POD0 eval, showing good ROM, strength and functional mobility/gait    Exercises Total Joint Exercises Ankle Circles/Pumps: AROM, 10 reps Quad Sets: AROM, 10 reps Short Arc Quad: 10 reps, Strengthening Heel Slides: Strengthening, AROM, 5 reps (lightly resisted leg ext) Hip ABduction/ADduction: Strengthening, 10 reps Straight Leg Raises: AROM, 10 reps Knee Flexion: AROM Goniometric ROM: 0-110 after 5 reps (AROM > 90 on first rep)   Assessment/Plan    PT Assessment Patient needs continued PT services  PT Problem List Decreased activity tolerance;Decreased strength;Decreased range of motion;Decreased mobility;Decreased balance;Decreased knowledge of use of DME;Decreased safety awareness;Pain       PT Treatment Interventions DME instruction;Gait training;Stair training;Functional mobility training;Therapeutic activities;Therapeutic exercise;Balance training;Patient/family education    PT Goals (Current goals can be found in the Care Plan section)  Acute Rehab PT Goals Patient Stated Goal: go home PT Goal Formulation: With patient Time For Goal Achievement: 11/03/22 Potential to Achieve Goals: Good    Frequency BID     Co-evaluation               AM-PAC PT "6 Clicks" Mobility  Outcome Measure Help needed turning from your back to your side while in a flat bed without using bedrails?: None Help needed moving from lying on your back to sitting on the side of a flat bed without using bedrails?: None Help needed moving to and from a bed to a chair (including a wheelchair)?: None Help needed standing up from a chair using your arms (e.g., wheelchair or bedside chair)?: A Little Help needed to walk in hospital room?: A Little Help needed  climbing 3-5 steps with a railing? : A Little 6 Click Score: 21    End of Session Equipment Utilized During Treatment: Gait belt Activity Tolerance: Patient tolerated treatment well Patient left: with chair alarm set;with call bell/phone within reach Nurse Communication: Mobility status PT Visit Diagnosis: Muscle weakness (generalized) (M62.81);Difficulty in walking, not elsewhere classified (R26.2);Pain Pain - Right/Left: Right Pain - part of body: Knee    Time: 1445-1515 PT Time Calculation (min) (ACUTE ONLY): 30 min   Charges:   PT Evaluation $PT Eval Low Complexity: 1 Low PT Treatments $Gait Training: 8-22 mins $Therapeutic Exercise: 8-22 mins        Malachi Pro, DPT 10/21/2022, 5:54 PM

## 2022-10-21 NOTE — Anesthesia Procedure Notes (Signed)
Spinal  Patient location during procedure: OR Start time: 10/21/2022 10:26 AM End time: 10/21/2022 10:26 AM Staffing Performed: resident/CRNA  Anesthesiologist: Stephanie Coup, MD Resident/CRNA: Monico Hoar, CRNA Performed by: Monico Hoar, CRNA Authorized by: Stephanie Coup, MD   Preanesthetic Checklist Completed: patient identified, IV checked, site marked, risks and benefits discussed, surgical consent, monitors and equipment checked, pre-op evaluation and timeout performed Spinal Block Patient position: sitting Prep: ChloraPrep Patient monitoring: continuous pulse ox, blood pressure and heart rate Approach: midline Location: L2-3 Injection technique: single-shot Needle Needle type: Pencan  Needle gauge: 24 G Needle length: 10 cm Needle insertion depth: 7 cm Assessment Sensory level: T6 Additional Notes 2.5 ml of 0.5% bupiv injected. Sensory loss at T6 no complications noted

## 2022-10-22 ENCOUNTER — Encounter: Payer: Self-pay | Admitting: Orthopedic Surgery

## 2022-10-22 DIAGNOSIS — M1711 Unilateral primary osteoarthritis, right knee: Secondary | ICD-10-CM | POA: Diagnosis not present

## 2022-10-22 LAB — BASIC METABOLIC PANEL
Anion gap: 6 (ref 5–15)
BUN: 15 mg/dL (ref 8–23)
CO2: 24 mmol/L (ref 22–32)
Calcium: 8.6 mg/dL — ABNORMAL LOW (ref 8.9–10.3)
Chloride: 107 mmol/L (ref 98–111)
Creatinine, Ser: 0.88 mg/dL (ref 0.44–1.00)
GFR, Estimated: >60 mL/min (ref >60)
Glucose, Bld: 130 mg/dL — ABNORMAL HIGH (ref 70–99)
Potassium: 4.6 mmol/L (ref 3.5–5.1)
Sodium: 137 mmol/L (ref 135–145)

## 2022-10-22 LAB — CBC
HCT: 35.6 % — ABNORMAL LOW (ref 36.0–46.0)
Hemoglobin: 11.5 g/dL — ABNORMAL LOW (ref 12.0–15.0)
MCH: 31.8 pg (ref 26.0–34.0)
MCHC: 32.3 g/dL (ref 30.0–36.0)
MCV: 98.3 fL (ref 80.0–100.0)
Platelets: 238 10*3/uL (ref 150–400)
RBC: 3.62 MIL/uL — ABNORMAL LOW (ref 3.87–5.11)
RDW: 14.6 % (ref 11.5–15.5)
WBC: 7.9 10*3/uL (ref 4.0–10.5)
nRBC: 0 % (ref 0.0–0.2)

## 2022-10-22 MED ORDER — DOCUSATE SODIUM 100 MG PO CAPS: 100.0000 mg | ORAL_CAPSULE | Freq: Two times a day (BID) | ORAL | 0 refills | Status: DC

## 2022-10-22 MED ORDER — ACETAMINOPHEN 500 MG PO TABS: 1000.0000 mg | ORAL_TABLET | Freq: Three times a day (TID) | ORAL | 0 refills | Status: AC

## 2022-10-22 MED ORDER — TRAMADOL HCL 50 MG PO TABS: 50.0000 mg | ORAL_TABLET | Freq: Four times a day (QID) | ORAL | 0 refills | Status: DC | PRN

## 2022-10-22 MED ORDER — ONDANSETRON HCL 4 MG PO TABS: 4.0000 mg | ORAL_TABLET | Freq: Four times a day (QID) | ORAL | 0 refills | Status: DC | PRN

## 2022-10-22 MED ORDER — ENOXAPARIN SODIUM 40 MG/0.4ML IJ SOSY: 40.0000 mg | PREFILLED_SYRINGE | INTRAMUSCULAR | 0 refills | Status: DC

## 2022-10-22 MED ORDER — CELECOXIB 200 MG PO CAPS: 200.0000 mg | ORAL_CAPSULE | Freq: Two times a day (BID) | ORAL | 0 refills | Status: AC

## 2022-10-22 NOTE — TOC Transition Note (Signed)
Transition of Care [facility]) - CM/SW Discharge Note   Patient Details  Name: Julie Beck MRN: [id number] Date of Birth: 1945-12-04  Transition of Care [facility]) CM/SW Contact:  Garret Reddish, RN Phone Number: 10/22/2022, 9:55 AM   Clinical Narrative:  Chart reviewed. Noted that the patient was admitted with TKR (total knee replacement).    I have spoken with patient about Home Health services on discharge.  Patient has been pre-arranged with [facility] services.  Julie Beck would like to use [facility] for Home Health services.    Patient reports that she will need a 2 wheeled rolling and BSC. I have asked Barbara Cower with [employer] with to provide the 2 wheeled rolling walker and BSC.  [employer] will provide the [facility] and rolling walker at bedside today.    Patient reports that her son will transport her home today.    I have informed staff nurse of the above information.    1034 am- Staff nurse informed me that patient was down stairs in the car and DME had not been delivered.  Staff nurse informs me that their must have been some confusion as patient is in the car and reported that the DME would be down stairs.  I explained to staff nurse I spoke with patient and informed the patient that she would need to wait for the DME prior to discharging home today.  I have also informed the staff nurse that [employer] delivers DME  to the bedside and would not tell a patient that DME would be in the lobby.  Staff nurse has asked could DME be delivered to her home.  I have informed staff nurse that I would try and have DME arranged to patient's home.    1035- Noted that no DME orders where placed in the chart.    1037- Provider Floyce Stakes notified.  Provider reports that he is unable to place DME orders since patient has been discharged.    1115- I have called Dr. Ellene Route office  and left a message for the staff nurse to call me back in regards to  Julie Beck's DME.    1212 pm - I have receive a return call from Staff  nurse French Ana with Dr. Ellene Route office.  I have informed French Ana that patient discharged home today and their where no DME orders placed.  I have asked French Ana could she please fax a signed order  for a 2 wheeled rolling walker, and a BSC to [employer] at [phone].  French Ana informs me that she will obtain order and fax to [employer].    1336- I have received call from [place] and he informs me that he received orders for DME but the orders where not signed.    1345- I have called and left a message for [facility] Dr. Ellene Route office to please return call and that the DME orders will need to be signed by a provider before [employer] can process the orders.    1545-  I have received a return back from [place] with Dr. Ellene Route office.  I have informed her that the DME orders will need to be signed prior to [employer] processing the DME orders.  I have informed French Ana that I am trying to make sure patient receives her DME today and that patient will not have to go through the weekend without the needed DME.  French Ana reports that she will take care of getting the orders signed and will fax back to [employer].    1546- I  have informed Barbara Cower with [employer] that French Ana will be faxing signed DME orders.  1613- Barbara Cower report he continues to not have the signed orders.    1620- Barbara Cower calls and informs me that if he does not receive the orders by 1645 he will call me back and let me know.    1645- I have called Barbara Cower and he has made me aware that he has finally received the signed orders for the DME.  Barbara Cower reports that he will have DME sent to patient's home today.                     Final next level of care: Home w Home Health Services Barriers to Discharge: No Barriers Identified   Patient Goals and CMS Choice CMS Medicare.gov Compare Post Acute Care list provided to:: Patient Choice offered to / list presented to : Patient  Discharge Placement                      Patient and family notified of of transfer:  10/22/22  Discharge Plan and Services Additional resources added to the After Visit Summary for                  DME Arranged: 3-N-1, Walker rolling DME Agency: [employer] Date DME Agency Contacted: 10/22/22 Time DME Agency Contacted: [phone] Representative spoke with at DME Agency: Barbara Cower HH Arranged: PT, OT [facility] Agency: [facility] Date [facility] Agency Contacted: 10/22/22 Time HH Agency Contacted: [phone] Representative spoke with at [facility] Agency: [place]  Social Determinants of Health (SDOH) Interventions SDOH Screenings   Food Insecurity: No Food Insecurity (10/21/2022)  Housing: Patient Declined (10/21/2022)  Transportation Needs: No Transportation Needs (10/21/2022)  Utilities: Not At Risk (10/21/2022)  Alcohol Screen: Low Risk  (05/21/2021)  Depression (PHQ2-9): Low Risk  (10/07/2022)  Financial Resource Strain: Low Risk  (05/21/2021)  Physical Activity: Inactive (05/21/2021)  Social Connections: Moderately Isolated (05/21/2021)  Stress: No Stress Concern Present (05/21/2021)  Tobacco Use: Medium Risk (10/22/2022)     Readmission Risk Interventions     No data to display

## 2022-10-22 NOTE — Plan of Care (Deleted)
Problem: Education: Goal: Knowledge of the prescribed therapeutic regimen will improve 10/22/2022 0833 by Jacki Cones, RN Outcome: Adequate for Discharge 10/22/2022 [phone] by Jacki Cones, RN Outcome: Adequate for Discharge Goal: Individualized Educational Video(s) 10/22/2022 0833 by Jacki Cones, RN Outcome: Adequate for Discharge 10/22/2022 [phone] by Jacki Cones, RN Outcome: Adequate for Discharge   Problem: Activity: Goal: Ability to avoid complications of mobility impairment will improve 10/22/2022 0833 by Jacki Cones, RN Outcome: Adequate for Discharge 10/22/2022 [phone] by Jacki Cones, RN Outcome: Adequate for Discharge Goal: Range of joint motion will improve 10/22/2022 0833 by Jacki Cones, RN Outcome: Adequate for Discharge 10/22/2022 [phone] by Jacki Cones, RN Outcome: Adequate for Discharge   Problem: Clinical Measurements: Goal: Postoperative complications will be avoided or minimized 10/22/2022 0833 by Jacki Cones, RN Outcome: Adequate for Discharge 10/22/2022 [phone] by Jacki Cones, RN Outcome: Adequate for Discharge   Problem: Pain Management: Goal: Pain level will decrease with appropriate interventions 10/22/2022 0833 by Jacki Cones, RN Outcome: Adequate for Discharge 10/22/2022 [phone] by Jacki Cones, RN Outcome: Adequate for Discharge   Problem: Skin Integrity: Goal: Will show signs of wound healing 10/22/2022 0833 by Jacki Cones, RN Outcome: Adequate for Discharge 10/22/2022 [phone] by Jacki Cones, RN Outcome: Adequate for Discharge   Problem: Education: Goal: Knowledge of General Education information will improve Description: Including pain rating scale, medication(s)/side effects and non-pharmacologic comfort measures 10/22/2022 0833 by Jacki Cones, RN Outcome: Adequate for Discharge 10/22/2022 [phone] by Jacki Cones, RN Outcome: Adequate for Discharge   Problem: Health  Behavior/Discharge Planning: Goal: Ability to manage health-related needs will improve 10/22/2022 0833 by Jacki Cones, RN Outcome: Adequate for Discharge 10/22/2022 [phone] by Jacki Cones, RN Outcome: Adequate for Discharge   Problem: Clinical Measurements: Goal: Ability to maintain clinical measurements within normal limits will improve 10/22/2022 0833 by Jacki Cones, RN Outcome: Adequate for Discharge 10/22/2022 [phone] by Jacki Cones, RN Outcome: Adequate for Discharge Goal: Will remain free from infection 10/22/2022 0833 by Jacki Cones, RN Outcome: Adequate for Discharge 10/22/2022 [phone] by Jacki Cones, RN Outcome: Adequate for Discharge Goal: Diagnostic test results will improve 10/22/2022 0833 by Jacki Cones, RN Outcome: Adequate for Discharge 10/22/2022 [phone] by Jacki Cones, RN Outcome: Adequate for Discharge Goal: Respiratory complications will improve 10/22/2022 0833 by Jacki Cones, RN Outcome: Adequate for Discharge 10/22/2022 [phone] by Jacki Cones, RN Outcome: Adequate for Discharge Goal: Cardiovascular complication will be avoided 10/22/2022 0833 by Jacki Cones, RN Outcome: Adequate for Discharge 10/22/2022 [phone] by Jacki Cones, RN Outcome: Adequate for Discharge   Problem: Activity: Goal: Risk for activity intolerance will decrease 10/22/2022 0833 by Jacki Cones, RN Outcome: Adequate for Discharge 10/22/2022 [phone] by Jacki Cones, RN Outcome: Adequate for Discharge   Problem: Nutrition: Goal: Adequate nutrition will be maintained 10/22/2022 0833 by Jacki Cones, RN Outcome: Adequate for Discharge 10/22/2022 [phone] by Jacki Cones, RN Outcome: Adequate for Discharge   Problem: Coping: Goal: Level of anxiety will decrease 10/22/2022 0833 by Jacki Cones, RN Outcome: Adequate for Discharge 10/22/2022 [phone] by Jacki Cones, RN Outcome: Adequate for Discharge    Problem: Elimination: Goal: Will not experience complications related to bowel motility 10/22/2022 0833 by Jacki Cones, RN Outcome: Adequate for Discharge 10/22/2022 [phone] by Jacki Cones, RN Outcome: Adequate for Discharge Goal: Will not experience complications related to urinary  retention 10/22/2022 1610 by Jacki Cones, RN Outcome: Adequate for Discharge 10/22/2022 [phone] by Jacki Cones, RN Outcome: Adequate for Discharge   Problem: Pain Managment: Goal: General experience of comfort will improve 10/22/2022 0833 by Jacki Cones, RN Outcome: Adequate for Discharge 10/22/2022 [phone] by Jacki Cones, RN Outcome: Adequate for Discharge   Problem: Safety: Goal: Ability to remain free from injury will improve 10/22/2022 0833 by Jacki Cones, RN Outcome: Adequate for Discharge 10/22/2022 [phone] by Jacki Cones, RN Outcome: Adequate for Discharge   Problem: Skin Integrity: Goal: Risk for impaired skin integrity will decrease 10/22/2022 0833 by Jacki Cones, RN Outcome: Adequate for Discharge 10/22/2022 [phone] by Jacki Cones, RN Outcome: Adequate for Discharge

## 2022-10-22 NOTE — Plan of Care (Signed)

## 2022-10-22 NOTE — TOC Progression Note (Signed)
Transition of Care [facility]) - Progression Note    Patient Details  Name: Julie Beck MRN: [id number] Date of Birth: [birth year]  Transition of Care [facility]) CM/SW Contact  Darleene Cleaver, [place] Phone Number: 10/22/2022, 4:57 PM  Clinical Narrative:     CSW received phone call from rehab director Baxter Hire, patient's family was contacting her to find out where the walker is.  CSW spoke to [place] at [employer], and the order was not entered yet once the family arrived to pick up patient.  Bedside nurse spoke to patient and she said her ride is here even though she was still waiting for the equipment to be delivered and orders to be finalized.   The [facility] staff member contacted DME company to get equipment ordered and was waiting for the orders to be written however patient was ready to leave before the order was completed.  This CSW contacted Barbara Cower at [employer], and he was able to get the orders written and rolling walker and 3 in 1 will be delivered to patient at home.  TOC updated Architectural technologist.     Barriers to Discharge: No Barriers Identified  Expected Discharge Plan and Services         Expected Discharge Date: 10/22/22               DME Arranged: 3-N-1, Walker rolling DME Agency: [employer] Date DME Agency Contacted: 10/22/22 Time DME Agency Contacted: [phone] Representative spoke with at DME Agency: Barbara Cower HH Arranged: PT, OT [facility] Agency: [facility] Date [facility] Agency Contacted: 10/22/22 Time HH Agency Contacted: [phone] Representative spoke with at [facility] Agency: [place]   Social Determinants of Health (SDOH) Interventions SDOH Screenings   Food Insecurity: No Food Insecurity (10/21/2022)  Housing: Patient Declined (10/21/2022)  Transportation Needs: No Transportation Needs (10/21/2022)  Utilities: Not At Risk (10/21/2022)  Alcohol Screen: Low Risk  (05/21/2021)  Depression (PHQ2-9): Low Risk  (10/07/2022)  Financial Resource Strain: Low Risk  (05/21/2021)  Physical Activity: Inactive  (05/21/2021)  Social Connections: Moderately Isolated (05/21/2021)  Stress: No Stress Concern Present (05/21/2021)  Tobacco Use: Medium Risk (10/22/2022)    Readmission Risk Interventions     No data to display

## 2022-10-22 NOTE — Discharge Summary (Addendum)
Physician Discharge Summary  Patient ID: Julie Beck MRN: [id number] DOB/AGE: August 10, 1945 77 y.o.  Admit date: 10/21/2022 Discharge date: 10/22/2022  Admission Diagnoses:  S/P TKR (total knee replacement) using cement, right [Z96.651]   Discharge Diagnoses: Patient Active Problem List   Diagnosis Date Noted   S/P TKR (total knee replacement) using cement, right 10/21/2022   History of colonic polyps    Hypo-osmolality and hyponatremia 05/07/2020   SIADH (syndrome of inappropriate ADH production) (HCC)    Pulmonary nodule    Thyroid nodule    Osteopenia 01/16/2020   Cirrhosis of liver (HCC) 07/20/2016   Centrilobular emphysema (HCC) 07/12/2016   Thoracic aortic atherosclerosis (HCC) 07/12/2016   Coronary artery disease 07/12/2016   Personal history of tobacco use, presenting hazards to health 06/11/2016   Hyperlipidemia 03/18/2016   Abnormal EKG 03/17/2016   GERD (gastroesophageal reflux disease) 03/17/2016   Overweight (BMI 25.0-29.9) 03/17/2016    Past Medical History:  Diagnosis Date   Abnormal CT of liver 07/12/2016   Chest CT Feb 2018   Arthritis    Centrilobular emphysema (HCC) 07/12/2016   Chest CT Feb 2018   Coronary artery disease 07/12/2016   Followed by Dr. Okey Dupre, seen on chest CT Feb 2018   Epigastric abdominal pain 03/17/2016   History of kidney stones    Hyperlipidemia    Overweight (BMI 25.0-29.9) 03/17/2016   SIADH (syndrome of inappropriate ADH production) (HCC)    Thoracic aortic atherosclerosis (HCC) 07/12/2016   Chest CT scan Feb 2018   Tobacco abuse 03/17/2016     Transfusion: none   Consultants (if any):   Discharged Condition: Improved  Hospital Course: Julie Beck is an 77 y.o. female who was admitted 10/21/2022 with a diagnosis of S/P TKR (total knee replacement) using cement, right and went to the operating room on 10/21/2022 and underwent the above named procedures.    Surgeries: Procedure(s): TOTAL KNEE ARTHROPLASTY on 10/21/2022 Patient  tolerated the surgery well. Taken to PACU where she was stabilized and then transferred to the orthopedic floor.  Started on Lovenox 30 mg q 12 hrs. TEDs and SCDs applied bilaterally. Heels elevated on bed. No evidence of DVT. Negative Homan. Physical therapy started on day #1 for gait training and transfer. OT started day #1 for ADL and assisted devices. Patient's IV was d/c on day #1. Patient was able to safely and independently complete all PT goals. PT recommending discharge to home.   On post op day #1 patient was stable and ready for discharge to home with  HHPT.  Implants: Femur: Persona Size 8 CR   Tibia: Persona Size E w/ 14x66mm stem extension  Poly: 12mm MC  Patella: 32mm x8.70mm symmetric button   She was given perioperative antibiotics:  Anti-infectives (From admission, onward)    Start     Dose/Rate Route Frequency Ordered Stop   10/21/22 1700  ceFAZolin (ANCEF) IVPB 2g/100 mL premix        2 g 200 mL/hr over 30 Minutes Intravenous Every 6 hours 10/21/22 1602 10/21/22 2323   10/21/22 0600  ceFAZolin (ANCEF) IVPB 2g/100 mL premix        2 g 200 mL/hr over 30 Minutes Intravenous On call to O.R. 10/20/22 2148 10/21/22 1031     .  She was given sequential compression devices, early ambulation, and Lovenox TEDs for DVT prophylaxis.  She benefited maximally from the hospital stay and there were no complications.    Recent vital signs:  Vitals:   10/21/22  2309 10/22/22 0507  BP: (!) 157/73 (!) 152/70  Pulse: 64 65  Resp: 18 18  Temp: 97.6 F (36.4 C) 98 F (36.7 C)  SpO2: 98% 96%    Recent laboratory studies:  Lab Results  Component Value Date   HGB 11.5 (L) 10/22/2022   HGB 13.3 10/05/2022   HGB 12.3 04/25/2020   Lab Results  Component Value Date   WBC 7.9 10/22/2022   PLT 238 10/22/2022   No results found for: "INR" Lab Results  Component Value Date   NA 137 10/22/2022   K 4.6 10/22/2022   CL 107 10/22/2022   CO2 24 10/22/2022   BUN 15 10/22/2022    CREATININE 0.88 10/22/2022   GLUCOSE 130 (H) 10/22/2022    Discharge Medications:   Allergies as of 10/22/2022       Reactions   Sulfa Antibiotics Hives   Latex Itching   Itching every once in a while         Medication List     TAKE these medications    acetaminophen 500 MG tablet Commonly known as: TYLENOL Take 2 tablets (1,000 mg total) by mouth every 8 (eight) hours.   amLODipine 5 MG tablet Commonly known as: NORVASC Take 1 tablet (5 mg total) by mouth daily.   celecoxib 200 MG capsule Commonly known as: CeleBREX Take 1 capsule (200 mg total) by mouth 2 (two) times daily for 14 days.   docusate sodium 100 MG capsule Commonly known as: COLACE Take 1 capsule (100 mg total) by mouth 2 (two) times daily.   enoxaparin 40 MG/0.4ML injection Commonly known as: LOVENOX Inject 0.4 mLs (40 mg total) into the skin daily for 14 days.   ondansetron 4 MG tablet Commonly known as: ZOFRAN Take 1 tablet (4 mg total) by mouth every 6 (six) hours as needed for nausea.   traMADol 50 MG tablet Commonly known as: ULTRAM Take 1 tablet (50 mg total) by mouth every 6 (six) hours as needed for moderate pain.        Diagnostic Studies: DG Knee Right Port  Result Date: 10/21/2022 CLINICAL DATA:  Status post knee replacement. EXAM: PORTABLE RIGHT KNEE - 1-2 VIEW COMPARISON:  None Available. FINDINGS: Right knee arthroplasty in expected alignment. No periprosthetic lucency or fracture. There has been patellar resurfacing. Recent postsurgical change includes air and edema in the soft tissues and joint space. IMPRESSION: Right knee arthroplasty without immediate postoperative complication. Electronically Signed   By: Narda Rutherford M.D.   On: 10/21/2022 13:23    Disposition: Discharge disposition: 06-Home-Health Care Svc          Follow-up Information     Evon Slack, PA-C Follow up in 2 week(s).   Specialties: Orthopedic Surgery, Emergency Medicine Contact  information: [address] [place] [address] [phone]                  Signed: Patience Musca 10/22/2022, 8:04 AM

## 2022-10-22 NOTE — Discharge Instructions (Signed)
 Instructions after Total Knee Replacement   Zachary Aberman M.D.     Dept. of Orthopaedics & Sports Medicine  [facility]  [address]  [place], [place]  [address]  Phone: [phone]   Fax: [phone]    DIET: Drink plenty of non-alcoholic fluids. Resume your normal diet. Include foods high in fiber.  ACTIVITY:  You may use crutches or a walker with weight-bearing as tolerated, unless instructed otherwise. You may be weaned off of the walker or crutches by your Physical Therapist.  Do NOT place pillows under the knee. Anything placed under the knee could limit your ability to straighten the knee.   Continue doing gentle exercises. Exercising will reduce the pain and swelling, increase motion, and prevent muscle weakness.   Please continue to use the TED compression stockings for 2 weeks. You may remove the stockings at night, but should reapply them in the morning. Do not drive or operate any equipment until instructed.  WOUND CARE:  Continue to use the PolarCare or ice packs periodically to reduce pain and swelling. You may begin showering 3 days after surgery with honeycomb dressing. Remove honeycomb dressing 7 days after surgery and continue showering. Allow dermabond to fall off on its own.  MEDICATIONS: You may resume your regular medications. Please take the pain medication as prescribed on the medication. Do not take pain medication on an empty stomach. You have been given a prescription for a blood thinner (Lovenox or Coumadin). Please take the medication as instructed. (NOTE: After completing a 2 week course of Lovenox, take one 81 mg Enteric-coated aspirin twice a day for 3 additional weeks. This along with elevation will help reduce the possibility of phlebitis in your operated leg.) Do not drive or drink alcoholic beverages when taking pain medications.  POSTOPERATIVE CONSTIPATION PROTOCOL Constipation - defined medically as fewer than three stools per  week and severe constipation as less than one stool per week.  One of the most common issues patients have following surgery is constipation.  Even if you have a regular bowel pattern at home, your normal regimen is likely to be disrupted due to multiple reasons following surgery.  Combination of anesthesia, postoperative narcotics, change in appetite and fluid intake all can affect your bowels.  In order to avoid complications following surgery, here are some recommendations in order to help you during your recovery period.  Colace (docusate) - Pick up an over-the-counter form of Colace or another stool softener and take twice a day as long as you are requiring postoperative pain medications.  Take with a full glass of water daily.  If you experience loose stools or diarrhea, hold the colace until you stool forms back up.  If your symptoms do not get better within 1 week or if they get worse, check with your doctor.  Dulcolax (bisacodyl) - Pick up over-the-counter and take as directed by the product packaging as needed to assist with the movement of your bowels.  Take with a full glass of water.  Use this product as needed if not relieved by Colace only.   MiraLax (polyethylene glycol) - Pick up over-the-counter to have on hand.  MiraLax is a solution that will increase the amount of water in your bowels to assist with bowel movements.  Take as directed and can mix with a glass of water, juice, soda, coffee, or tea.  Take if you go more than two days without a movement. Do not use MiraLax more than once per day.   Call your doctor if you are still constipated or irregular after using this medication for 7 days in a row.  If you continue to have problems with postoperative constipation, please contact the office for further assistance and recommendations.  If you experience "the worst abdominal pain ever" or develop nausea or vomiting, please contact the office immediatly for further recommendations for  treatment.   CALL THE OFFICE FOR: Temperature above 101 degrees Excessive bleeding or drainage on the dressing. Excessive swelling, coldness, or paleness of the toes. Persistent nausea and vomiting.  FOLLOW-UP:  You should have an appointment to return to the office in 14 days after surgery. Arrangements have been made for continuation of Physical Therapy (either home therapy or outpatient therapy).  

## 2022-10-22 NOTE — Progress Notes (Addendum)
   Subjective: 1 Day Post-Op Procedure(s) (LRB): TOTAL KNEE ARTHROPLASTY (Right) Patient reports pain as mild.   Patient is well, and has had no acute complaints or problems Denies any CP, SOB, ABD pain. We will continue therapy today. Patient ambulated 133ft yesterday with PT Plan is to go Home after hospital stay.  Objective: Vital signs in last 24 hours: Temp:  [96.8 F (36 C)-98 F (36.7 C)] 98 F (36.7 C) (06/07 0507) Pulse Rate:  [52-76] 65 (06/07 0507) Resp:  [10-26] 18 (06/07 0507) BP: (123-182)/(56-79) 152/70 (06/07 0507) SpO2:  [92 %-100 %] 96 % (06/07 0507)  Intake/Output from previous day: 06/06 0701 - 06/07 0700 In: 120 [P.O.:120] Out: 25 [Blood:25] Intake/Output this shift: No intake/output data recorded.  Recent Labs    10/22/22 0434  HGB 11.5*   Recent Labs    10/22/22 0434  WBC 7.9  RBC 3.62*  HCT 35.6*  PLT 238   Recent Labs    10/22/22 0434  NA 137  K 4.6  CL 107  CO2 24  BUN 15  CREATININE 0.88  GLUCOSE 130*  CALCIUM 8.6*   No results for input(s): "LABPT", "INR" in the last 72 hours.  EXAM General - Patient is Alert, Appropriate, and Oriented Extremity - Neurovascular intact Sensation intact distally Intact pulses distally Dorsiflexion/Plantar flexion intact No cellulitis present Compartment soft Dressing - dressing C/D/I and no drainage Motor Function - intact, moving foot and toes well on exam.   Past Medical History:  Diagnosis Date   Abnormal CT of liver 07/12/2016   Chest CT Feb 2018   Arthritis    Centrilobular emphysema (HCC) 07/12/2016   Chest CT Feb 2018   Coronary artery disease 07/12/2016   Followed by Dr. Okey Dupre, seen on chest CT Feb 2018   Epigastric abdominal pain 03/17/2016   History of kidney stones    Hyperlipidemia    Overweight (BMI 25.0-29.9) 03/17/2016   SIADH (syndrome of inappropriate ADH production) (HCC)    Thoracic aortic atherosclerosis (HCC) 07/12/2016   Chest CT scan Feb 2018   Tobacco  abuse 03/17/2016    Assessment/Plan:   1 Day Post-Op Procedure(s) (LRB): TOTAL KNEE ARTHROPLASTY (Right) Principal Problem:   S/P TKR (total knee replacement) using cement, right  Estimated body mass index is 23.8 kg/m as calculated from the following:   Height as of 10/07/22: 5\' 8"  (1.727 m).   Weight as of 10/07/22: 71 kg. Advance diet Up with therapy Pain well controlled Labs and VSS CM to assist with discharge to home with HHPT today pending completion of PT goals  DVT Prophylaxis - Lovenox, TED hose, and SCDs Weight-Bearing as tolerated to right leg   T. Cranston Neighbor, PA-C [facility] 10/22/2022, 7:43 AM  Patient seen and examined, agree with above plan.  The patient is doing well status post right total knee arthroplasty, no concerns at this time.  Pain is controlled.  Discussed DVT prophylaxis, pain medication use, and safe transition to home.  All questions answered the patient agrees with above plan will go home after clears PT.   Reinaldo Berber MD

## 2022-10-22 NOTE — Anesthesia Postprocedure Evaluation (Signed)
Anesthesia Post Note  Patient: Julie Beck  Procedure(s) Performed: TOTAL KNEE ARTHROPLASTY (Right: Knee)  Patient location during evaluation: Nursing Unit Anesthesia Type: Spinal Level of consciousness: awake and alert and oriented Pain management: pain level controlled Vital Signs Assessment: post-procedure vital signs reviewed and stable Respiratory status: spontaneous breathing and respiratory function stable Cardiovascular status: blood pressure returned to baseline and stable Postop Assessment: no headache, no backache, no apparent nausea or vomiting, adequate PO intake and able to ambulate Anesthetic complications: no   No notable events documented.   Last Vitals:  Vitals:   10/21/22 2309 10/22/22 0507  BP: (!) 157/73 (!) 152/70  Pulse: 64 65  Resp: 18 18  Temp: 36.4 C 36.7 C  SpO2: 98% 96%    Last Pain:  Vitals:   10/21/22 2336  TempSrc:   PainSc: 0-No pain                 [place] Syble Picco

## 2022-10-22 NOTE — Progress Notes (Signed)
Patient is not able to walk the distance required to go the bathroom, or she is unable to safely negotiate stairs required to access the bathroom.  A 3in1 BSC will alleviate this problem.       T. Chris Tehran Rabenold, PA-C [facility] 

## 2022-10-22 NOTE — Plan of Care (Signed)
Problem: Education: Goal: Knowledge of the prescribed therapeutic regimen will improve 10/22/2022 0835 by Jacki Cones, RN Outcome: Adequate for Discharge 10/22/2022 [phone] by Jacki Cones, RN Outcome: Adequate for Discharge 10/22/2022 [phone] by Jacki Cones, RN Outcome: Adequate for Discharge Goal: Individualized Educational Video(s) 10/22/2022 0835 by Jacki Cones, RN Outcome: Adequate for Discharge 10/22/2022 [phone] by Jacki Cones, RN Outcome: Adequate for Discharge 10/22/2022 [phone] by Jacki Cones, RN Outcome: Adequate for Discharge   Problem: Activity: Goal: Ability to avoid complications of mobility impairment will improve 10/22/2022 0835 by Jacki Cones, RN Outcome: Adequate for Discharge 10/22/2022 [phone] by Jacki Cones, RN Outcome: Adequate for Discharge 10/22/2022 [phone] by Jacki Cones, RN Outcome: Adequate for Discharge Goal: Range of joint motion will improve 10/22/2022 0835 by Jacki Cones, RN Outcome: Adequate for Discharge 10/22/2022 [phone] by Jacki Cones, RN Outcome: Adequate for Discharge 10/22/2022 [phone] by Jacki Cones, RN Outcome: Adequate for Discharge   Problem: Clinical Measurements: Goal: Postoperative complications will be avoided or minimized 10/22/2022 0835 by Jacki Cones, RN Outcome: Adequate for Discharge 10/22/2022 [phone] by Jacki Cones, RN Outcome: Adequate for Discharge 10/22/2022 [phone] by Jacki Cones, RN Outcome: Adequate for Discharge   Problem: Pain Management: Goal: Pain level will decrease with appropriate interventions 10/22/2022 0835 by Jacki Cones, RN Outcome: Adequate for Discharge 10/22/2022 [phone] by Jacki Cones, RN Outcome: Adequate for Discharge 10/22/2022 [phone] by Jacki Cones, RN Outcome: Adequate for Discharge   Problem: Skin Integrity: Goal: Will show signs of wound healing 10/22/2022 0835 by Jacki Cones, RN Outcome: Adequate for  Discharge 10/22/2022 [phone] by Jacki Cones, RN Outcome: Adequate for Discharge 10/22/2022 [phone] by Jacki Cones, RN Outcome: Adequate for Discharge   Problem: Education: Goal: Knowledge of General Education information will improve Description: Including pain rating scale, medication(s)/side effects and non-pharmacologic comfort measures 10/22/2022 0835 by Jacki Cones, RN Outcome: Adequate for Discharge 10/22/2022 [phone] by Jacki Cones, RN Outcome: Adequate for Discharge 10/22/2022 [phone] by Jacki Cones, RN Outcome: Adequate for Discharge   Problem: Health Behavior/Discharge Planning: Goal: Ability to manage health-related needs will improve 10/22/2022 0835 by Jacki Cones, RN Outcome: Adequate for Discharge 10/22/2022 [phone] by Jacki Cones, RN Outcome: Adequate for Discharge 10/22/2022 [phone] by Jacki Cones, RN Outcome: Adequate for Discharge   Problem: Clinical Measurements: Goal: Ability to maintain clinical measurements within normal limits will improve 10/22/2022 0835 by Jacki Cones, RN Outcome: Adequate for Discharge 10/22/2022 [phone] by Jacki Cones, RN Outcome: Adequate for Discharge 10/22/2022 [phone] by Jacki Cones, RN Outcome: Adequate for Discharge Goal: Will remain free from infection 10/22/2022 0835 by Jacki Cones, RN Outcome: Adequate for Discharge 10/22/2022 [phone] by Jacki Cones, RN Outcome: Adequate for Discharge 10/22/2022 [phone] by Jacki Cones, RN Outcome: Adequate for Discharge Goal: Diagnostic test results will improve 10/22/2022 0835 by Jacki Cones, RN Outcome: Adequate for Discharge 10/22/2022 [phone] by Jacki Cones, RN Outcome: Adequate for Discharge 10/22/2022 [phone] by Jacki Cones, RN Outcome: Adequate for Discharge Goal: Respiratory complications will improve 10/22/2022 0835 by Jacki Cones, RN Outcome: Adequate for Discharge 10/22/2022 [phone] by Jacki Cones, RN Outcome: Adequate for Discharge 10/22/2022 [phone] by Jacki Cones, RN Outcome: Adequate for Discharge Goal: Cardiovascular complication will be avoided 10/22/2022 0835 by Jacki Cones, RN Outcome: Adequate for Discharge 10/22/2022 [phone] by Jacki Cones, RN Outcome:  Adequate for Discharge 10/22/2022 1610 by Jacki Cones, RN Outcome: Adequate for Discharge   Problem: Activity: Goal: Risk for activity intolerance will decrease 10/22/2022 0835 by Jacki Cones, RN Outcome: Adequate for Discharge 10/22/2022 [phone] by Jacki Cones, RN Outcome: Adequate for Discharge 10/22/2022 [phone] by Jacki Cones, RN Outcome: Adequate for Discharge   Problem: Nutrition: Goal: Adequate nutrition will be maintained 10/22/2022 0835 by Jacki Cones, RN Outcome: Adequate for Discharge 10/22/2022 [phone] by Jacki Cones, RN Outcome: Adequate for Discharge 10/22/2022 [phone] by Jacki Cones, RN Outcome: Adequate for Discharge   Problem: Coping: Goal: Level of anxiety will decrease 10/22/2022 0835 by Jacki Cones, RN Outcome: Adequate for Discharge 10/22/2022 [phone] by Jacki Cones, RN Outcome: Adequate for Discharge 10/22/2022 [phone] by Jacki Cones, RN Outcome: Adequate for Discharge   Problem: Elimination: Goal: Will not experience complications related to bowel motility 10/22/2022 0835 by Jacki Cones, RN Outcome: Adequate for Discharge 10/22/2022 [phone] by Jacki Cones, RN Outcome: Adequate for Discharge 10/22/2022 [phone] by Jacki Cones, RN Outcome: Adequate for Discharge Goal: Will not experience complications related to urinary retention 10/22/2022 0835 by Jacki Cones, RN Outcome: Adequate for Discharge 10/22/2022 [phone] by Jacki Cones, RN Outcome: Adequate for Discharge 10/22/2022 [phone] by Jacki Cones, RN Outcome: Adequate for Discharge   Problem: Pain Managment: Goal: General experience of comfort  will improve 10/22/2022 0835 by Jacki Cones, RN Outcome: Adequate for Discharge 10/22/2022 [phone] by Jacki Cones, RN Outcome: Adequate for Discharge 10/22/2022 [phone] by Jacki Cones, RN Outcome: Adequate for Discharge   Problem: Safety: Goal: Ability to remain free from injury will improve 10/22/2022 0835 by Jacki Cones, RN Outcome: Adequate for Discharge 10/22/2022 [phone] by Jacki Cones, RN Outcome: Adequate for Discharge 10/22/2022 [phone] by Jacki Cones, RN Outcome: Adequate for Discharge   Problem: Skin Integrity: Goal: Risk for impaired skin integrity will decrease 10/22/2022 0835 by Jacki Cones, RN Outcome: Adequate for Discharge 10/22/2022 [phone] by Jacki Cones, RN Outcome: Adequate for Discharge 10/22/2022 [phone] by Jacki Cones, RN Outcome: Adequate for Discharge

## 2022-10-22 NOTE — Progress Notes (Signed)
This RN assisted patient with getting her shirt and pants on for discharge, patient has polar care machine at the bedside with the leg cuff on her leg attached to the machine. Assisted patient with disconnecting the machine to the leg cuff. Patient still has leg cuff on her leg and assisted with patient putting on pants over the leg cuff with the TED hose underneath. Informed patient that the rolling walker and bedside commode is being delivered to the room and to wait on this to be delivered. At this point polar care cuff is still on the patient's leg.   Per EVS Aggie Cosier; Aggie Cosier was in the room cleaning and helping the patient with putting her toiletries in her bags. She states patient had the bed sheets and blankets neatly folded on top of the bed and states the cuff could have possibly been placed there, and then patient put the sheets/blankets in the soiled linen container.   Informed patient walker and bedside commode are pending delivery to the bedside. Patient verbalizes understanding. Patient is alert and oriented x4.   Patient comes out of the room and states "My ride is here". Asked patient if walker and bedside commode were delivered to the room to which she states "I'm ready and they are meeting me downstairs with it". Patient is then wheeled downstairs to her vehicle by the tech, Ava. Son came up to the unit and states that patient no longer has polar cuff on. EVS Aggie Cosier states she did not see this cuff in the room, only the bone foam.

## 2022-11-01 ENCOUNTER — Ambulatory Visit: Payer: Medicare Other | Admitting: Cardiology

## 2023-01-26 DIAGNOSIS — I10 Essential (primary) hypertension: Secondary | ICD-10-CM | POA: Insufficient documentation

## 2023-10-14 ENCOUNTER — Emergency Department

## 2023-10-14 ENCOUNTER — Emergency Department
Admission: EM | Admit: 2023-10-14 | Discharge: 2023-10-14 | Disposition: A | Discharge status: Home / Self Care | Attending: Emergency Medicine | Admitting: Emergency Medicine

## 2023-10-14 DIAGNOSIS — I2510 Atherosclerotic heart disease of native coronary artery without angina pectoris: Secondary | ICD-10-CM | POA: Insufficient documentation

## 2023-10-14 DIAGNOSIS — R55 Syncope and collapse: Secondary | ICD-10-CM | POA: Diagnosis present

## 2023-10-14 DIAGNOSIS — N390 Urinary tract infection, site not specified: Secondary | ICD-10-CM | POA: Insufficient documentation

## 2023-10-14 DIAGNOSIS — Z96659 Presence of unspecified artificial knee joint: Secondary | ICD-10-CM | POA: Insufficient documentation

## 2023-10-14 LAB — CBC
HCT: 39 % (ref 36.0–46.0)
Hemoglobin: 12.3 g/dL (ref 12.0–15.0)
MCH: 29.3 pg (ref 26.0–34.0)
MCHC: 31.5 g/dL (ref 30.0–36.0)
MCV: 92.9 fL (ref 80.0–100.0)
Platelets: 280 10*3/uL (ref 150–400)
RBC: 4.2 MIL/uL (ref 3.87–5.11)
RDW: 14.3 % (ref 11.5–15.5)
WBC: 4.8 10*3/uL (ref 4.0–10.5)
nRBC: 0 % (ref 0.0–0.2)

## 2023-10-14 LAB — URINALYSIS, ROUTINE W REFLEX MICROSCOPIC
Bilirubin Urine: NEGATIVE
Glucose, UA: NEGATIVE mg/dL
Ketones, ur: 20 mg/dL — AB
Nitrite: POSITIVE — AB
Protein, ur: 30 mg/dL — AB
Specific Gravity, Urine: 1.013 (ref 1.005–1.030)
WBC, UA: >50 WBC/hpf (ref 0–5)
pH: 5 (ref 5.0–8.0)

## 2023-10-14 LAB — COMPREHENSIVE METABOLIC PANEL WITH GFR
ALT: 17 U/L (ref 0–44)
AST: 37 U/L (ref 15–41)
Albumin: 3.5 g/dL (ref 3.5–5.0)
Alkaline Phosphatase: 122 U/L (ref 38–126)
Anion gap: 12 (ref 5–15)
BUN: 13 mg/dL (ref 8–23)
CO2: 19 mmol/L — ABNORMAL LOW (ref 22–32)
Calcium: 8.4 mg/dL — ABNORMAL LOW (ref 8.9–10.3)
Chloride: 103 mmol/L (ref 98–111)
Creatinine, Ser: 0.9 mg/dL (ref 0.44–1.00)
GFR, Estimated: >60 mL/min (ref >60)
Glucose, Bld: 79 mg/dL (ref 70–99)
Potassium: 4.4 mmol/L (ref 3.5–5.1)
Sodium: 134 mmol/L — ABNORMAL LOW (ref 135–145)
Total Bilirubin: 1.3 mg/dL — ABNORMAL HIGH (ref 0.0–1.2)
Total Protein: 7.3 g/dL (ref 6.5–8.1)

## 2023-10-14 LAB — TROPONIN I (HIGH SENSITIVITY): Troponin I (High Sensitivity): 7 ng/L (ref <18)

## 2023-10-14 MED ORDER — CEPHALEXIN 500 MG PO CAPS: 500.0000 mg | ORAL_CAPSULE | Freq: Two times a day (BID) | ORAL | 0 refills | Status: AC

## 2023-10-14 MED ORDER — SODIUM CHLORIDE 0.9 % IV SOLN
1.0000 g | Freq: Once | INTRAVENOUS | Status: AC
  Administered 2023-10-14: 1 g via INTRAVENOUS
  Filled 2023-10-14: qty 10

## 2023-10-14 NOTE — ED Provider Notes (Signed)
 [facility] Provider Note    Event Date/Time   First MD Initiated Contact with Patient 10/14/23 1126     (approximate)   History   Loss of Consciousness   HPI  Julie Beck is a 78 y.o. 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  The patient states that she started to feel lightheaded, sat down, and then syncopized.  She hit her head on the side of the booth but not on the ground.  She states she otherwise has been feeling well in the last few days and has not been ill.  She states she is feeling fine now.  She states that she ate breakfast normally and otherwise has had no changes in her routine today.  She denies any chest pain or difficulty breathing.  She has no headache, nausea or vomiting, or fever.  I reviewed the past medical records.  The patient's most recent outpatient encounter was with orthopedics on 9/25 last year for follow-up of a knee arthroplasty.   Physical Exam   Triage Vital Signs: ED Triage Vitals  Encounter Vitals Group     BP 10/14/23 1105 (!) 184/86     Systolic BP Percentile --      Diastolic BP Percentile --      Pulse Rate 10/14/23 1105 87     Resp 10/14/23 1105 18     Temp 10/14/23 1105 98.3 F (36.8 C)     Temp Source 10/14/23 1105 Oral     SpO2 10/14/23 1105 100 %     Weight 10/14/23 1106 155 lb (70.3 kg)     Height 10/14/23 1106 5\' 8"  (1.727 m)     Head Circumference --      Peak Flow --      Pain Score 10/14/23 1105 0     Pain Loc --      Pain Education --      Exclude from Growth Chart --     Most recent vital signs: Vitals:   10/14/23 1300 10/14/23 1330  BP: (!) 159/65 (!) 180/73  Pulse: 64 72  Resp: 19 20  Temp:    SpO2: 100% 100%     General: Alert, well-appearing, no distress.  CV:  Good peripheral perfusion.  Resp:  Normal effort.  Lungs CTAB. Abd:  Soft and  nontender.  No distention.  Other:  EOMI.  PERRLA.  No facial droop.  Normal speech.  Motor intact in all extremities.  No ataxia on finger-to-nose.   ED Results / Procedures / Treatments   Labs (all labs ordered are listed, but only abnormal results are displayed) Labs Reviewed  COMPREHENSIVE METABOLIC PANEL WITH GFR - Abnormal; Notable for the following components:      Result Value   Sodium 134 (*)    CO2 19 (*)    Calcium  8.4 (*)    Total Bilirubin 1.3 (*)    All other components within normal limits  URINALYSIS, ROUTINE W REFLEX MICROSCOPIC - Abnormal; Notable for the following components:   Color, Urine YELLOW (*)    APPearance CLOUDY (*)    Hgb urine dipstick SMALL (*)    Ketones, ur 20 (*)    Protein, ur 30 (*)    Nitrite POSITIVE (*)    Leukocytes,Ua LARGE (*)    Bacteria, UA FEW (*)    All other components  within normal limits  CBC  CBG MONITORING, ED  TROPONIN I (HIGH SENSITIVITY)     EKG  ED ECG REPORT I, Lind Repine, the attending physician, personally viewed and interpreted this ECG.  Date: 10/14/2023 EKG Time: 1122 Rate: 73 Rhythm: normal sinus rhythm QRS Axis: normal Intervals: normal ST/T Wave abnormalities: normal Narrative Interpretation: no evidence of acute ischemia    RADIOLOGY  CT head: I independently viewed and interpreted the images; there is no ICH.  Radiology read is pending.  PROCEDURES:  Critical Care performed: No  Procedures   MEDICATIONS ORDERED IN ED: Medications  cefTRIAXone (ROCEPHIN) 1 g in sodium chloride  0.9 % 100 mL IVPB (1 g Intravenous New Bag/Given 10/14/23 1518)     IMPRESSION / MDM / ASSESSMENT AND PLAN / ED COURSE  I reviewed the triage vital signs and the nursing notes.  78 year old female with PMH as noted above presents with a syncopal episode with a brief prodrome of lightheadedness.  She is now asymptomatic.  On exam she is slightly hypertensive but other vital signs are normal.  There are  neurologic exam is nonfocal.  There is no significant trauma.  Differential diagnosis includes, but is not limited to, vasovagal episode, dehydration/hypovolemia, electrolyte abnormality, hypoglycemia, other metabolic cause, cardiac dysrhythmia, UTI or other infection, less likely CNS etiology.  We will obtain CT head, basic labs, cardiac enzymes, urinalysis, and reassess.  Patient's presentation is most consistent with acute presentation with potential threat to life or bodily function.  The patient is on the cardiac monitor to evaluate for evidence of arrhythmia and/or significant heart rate changes.   ----------------------------------------- 3:37 PM on 10/14/2023 -----------------------------------------  Urinalysis is consistent with UTI.  Troponin is negative.  CBC and CMP showed no acute findings.  On reassessment the patient feels well and remains asymptomatic.  Her vital signs are stable.  I did consider whether she may benefit from inpatient admission given the syncope with relatively brief prodrome.  However since her symptoms have completely resolved, there is a likely precipitating factor with the UTI, and no evidence of cardiac etiology, she is stable for discharge home as long as the CT is negative.  The patient herself would strongly prefer to go home.  Plan will be discharge as long as the CT is negative.  I have signed her out to the oncoming ED physician Dr. Martina Sledge.   FINAL CLINICAL IMPRESSION(S) / ED DIAGNOSES   Final diagnoses:  Syncope, unspecified syncope type  Urinary tract infection without hematuria, site unspecified     Rx / DC Orders   ED Discharge Orders          Ordered    cephALEXin (KEFLEX) 500 MG capsule  2 times daily        10/14/23 1509             Note:  This document was prepared using Dragon voice recognition software and may include unintentional dictation errors.    Lind Repine, MD 10/14/23 1538

## 2023-10-14 NOTE — ED Triage Notes (Signed)
 First Nurse Note: Patient to ED via [facility] from work for syncope. PT assisted to floor by coworkers. States also having dizziness. Pt report hx trouble with sodium levels. Aox4  75 HR 204/100 89 cbg 20 L forearm

## 2023-10-14 NOTE — Discharge Instructions (Addendum)
 Take the antibiotic as prescribed and finish the full course.  Follow-up with your primary care provider.  Make sure to drink plenty of fluids.  Return to the ER for new, worsening, or persistent severe weakness, lightheadedness, recurrent episodes of passing out, or any other new or worsening symptoms that concern you.

## 2023-10-14 NOTE — ED Triage Notes (Signed)
 See first nurse note. Pt presents to the ED via [facility] from work for a syncopal episode. Pt states that this has happened before when her sodium was low.

## 2023-10-24 ENCOUNTER — Ambulatory Visit

## 2023-10-24 VITALS — BP 140/70 | HR 62 | Ht 68.0 in | Wt 147.4 lb

## 2023-10-24 DIAGNOSIS — N390 Urinary tract infection, site not specified: Secondary | ICD-10-CM | POA: Insufficient documentation

## 2023-10-24 DIAGNOSIS — I700 Atherosclerosis of aorta: Secondary | ICD-10-CM

## 2023-10-24 DIAGNOSIS — R928 Other abnormal and inconclusive findings on diagnostic imaging of breast: Secondary | ICD-10-CM | POA: Insufficient documentation

## 2023-10-24 DIAGNOSIS — Z2821 Immunization not carried out because of patient refusal: Secondary | ICD-10-CM

## 2023-10-24 DIAGNOSIS — Z122 Encounter for screening for malignant neoplasm of respiratory organs: Secondary | ICD-10-CM

## 2023-10-24 DIAGNOSIS — Z780 Asymptomatic menopausal state: Secondary | ICD-10-CM

## 2023-10-24 DIAGNOSIS — E782 Mixed hyperlipidemia: Secondary | ICD-10-CM | POA: Diagnosis not present

## 2023-10-24 DIAGNOSIS — N6002 Solitary cyst of left breast: Secondary | ICD-10-CM | POA: Insufficient documentation

## 2023-10-24 DIAGNOSIS — I10 Essential (primary) hypertension: Secondary | ICD-10-CM | POA: Diagnosis not present

## 2023-10-24 DIAGNOSIS — Z711 Person with feared health complaint in whom no diagnosis is made: Secondary | ICD-10-CM

## 2023-10-24 DIAGNOSIS — K7460 Unspecified cirrhosis of liver: Secondary | ICD-10-CM

## 2023-10-24 DIAGNOSIS — Z131 Encounter for screening for diabetes mellitus: Secondary | ICD-10-CM

## 2023-10-24 DIAGNOSIS — R911 Solitary pulmonary nodule: Secondary | ICD-10-CM

## 2023-10-24 DIAGNOSIS — J432 Centrilobular emphysema: Secondary | ICD-10-CM

## 2023-10-24 DIAGNOSIS — R17 Unspecified jaundice: Secondary | ICD-10-CM | POA: Insufficient documentation

## 2023-10-24 DIAGNOSIS — E041 Nontoxic single thyroid nodule: Secondary | ICD-10-CM | POA: Insufficient documentation

## 2023-10-24 DIAGNOSIS — E871 Hypo-osmolality and hyponatremia: Secondary | ICD-10-CM

## 2023-10-24 DIAGNOSIS — E042 Nontoxic multinodular goiter: Secondary | ICD-10-CM | POA: Insufficient documentation

## 2023-10-24 MED ORDER — AMLODIPINE BESYLATE 2.5 MG PO TABS: 2.5000 mg | ORAL_TABLET | Freq: Every day | ORAL | 0 refills | Status: DC

## 2023-10-24 NOTE — Assessment & Plan Note (Signed)
 Recommend repeating low dose CT lungs. Imaging ordered.  Consider PFT, referral to pulmonology in the future.

## 2023-10-24 NOTE — Assessment & Plan Note (Signed)
 Asymptomatic, check TSH. Reviewed result from 06/11/20, not meeting criteria for FNA or imaging f/u. No difficulty swallowing, breathing.

## 2023-10-24 NOTE — Assessment & Plan Note (Signed)
 Incidental on CT chest from 07/14/20, asymptomatic. Discussed potentially getting PFTs in the future. Patient declined at this time, reconsider during f/u.

## 2023-10-24 NOTE — Assessment & Plan Note (Signed)
 Abnormal mammogram on 06/25/20, recommend b/l diagnostic mammogram.

## 2023-10-24 NOTE — Assessment & Plan Note (Signed)
 CT chest on 07/14/20. Not on statin. Pending lipid panel, consider restarting Atorvastatin  80 mfg daily. Repeat CT chest ordered.

## 2023-10-24 NOTE — Assessment & Plan Note (Signed)
 BP above goal, will start her on Amlodipine  2.5 mg daily along with DASH diet, exercise, cutting down on alcohol intake. Check home BP three times a week, f/u in 2 months with home BP log.

## 2023-10-24 NOTE — Assessment & Plan Note (Signed)
 Incidental on han 2018. Mildly elevated total bili during recent ED visit. Patient asymptomatic. Repeat CMP. Consider RUQ ultrasound, GI referral in the future. Counseled on limiting or even better quitting alcohol.

## 2023-10-24 NOTE — Assessment & Plan Note (Signed)
 Qualifies for pneumonia, shingles immunization. Declines pneumonia immunization today.  If you are interested in the shingles vaccine series (Shingrix), call your insurance or pharmacy to check on coverage and location it must be given.  If affordable - you can schedule it here or at your pharmacy depending on coverage.

## 2023-10-24 NOTE — Progress Notes (Signed)
 New Patient Office Visit   Subjective   Patient ID: Julie Beck, female    DOB: January 25, 1946  Age: 78 y.o. MRN: [id number]  CC:  Chief Complaint  Patient presents with   Establish Care    ED follow up   HPI Patient here with her son Angus Bark. History obtained from both son and patient. Does not have a PCP for about 3 years now as the previous PCP at [facility] clinic kept on changing new provides. She has been going to ED for acute concerns since last PCP visit somewhere in 2022.   1)  Abnormal mammogram on 06/25/20:   Per documentation it was recommended for the patient to return in about six months for breast diagnostic mammogram and possible ultrasound to follow-up on 4 mm mass in the lateral anterior left breast.  - She has not followed up on this. She denies unintentional weight loss,   2) Bilateral thyroid nodules:06/11/20, not meeting criteria for FNA or imaging f/u. No difficulty swallowing, breathing.   3) H/o hypertension with component of white coat syndrome: Has tried Amlodipine  5 mg in the past. She reports of developing side effects including dizziness, fall from low blood pressure in the past. Not checking BP at home. No chest pain, lower leg edema.   4) SIADH: Has seen nephrologist Dr. Lamount Pimple on 01/26/23. Was found to have mildly low serum sodium on 10/04/23.  5) Hyperlipidemia: Was treated with Atorvastatin  80 mg, Ezetimibe  10 mg in the past. She has not followed up with PCP fro more than 2 years and has not taken medications.   6) ED visit on 10/04/23 for altered mental status was found to have UTI and mildly low serum Ca, mildly elevated total bilirubin - Na 134, repeat.  - Ca 8.4 repeat.  - Mildly elevated total bilirubin. - UA positive for: Nitrites+leuk on UA with normal CBC. No urine culture was done.  - Sent home on Cephalexin  500 mg, BID for 7 days. Completed therapy feeling better.  - CT head 10/14/23: Chronic microvascular ischemic changes, right cerebral hemi  with punctate calci sus for embolized, calcified atherosclerotic plaque. ED on 10/14/23 LOC. BP was 184/86, EKG normal sinus rhythm.  - Patient's son reports he is concerned about her memory. Requesting evaluation for dementia. Family h/o Alzheimer's in patient's mom.   7)  Pulmonary nodules: Evident on low dose CT: 07/14/20, RADS-2 lung nodules, annual follow up was recommended.   8) Emphysema, aortic atherosclerosis  seen on low dose CT lungs. Has not have PFTs done in the past. Denies wheezing, cough.   9)  50 pack-year smoking history. Quit in 2022.   10) Preventative:  She drinks 12 beers per week.  Deet: Mostly homemade food.  Exercise: She is on her feet all day.  Takes women's daily multivitamin.  Due for DEXA: Last 04/22/14.  Qualifies for Shingles, pneumonia immunization.   Outpatient Encounter Medications as of 10/24/2023  Medication Sig   acetaminophen  (TYLENOL ) 500 MG tablet Take 2 tablets (1,000 mg total) by mouth every 8 (eight) hours.   amLODipine  (NORVASC ) 2.5 MG tablet Take 1 tablet (2.5 mg total) by mouth daily.   [DISCONTINUED] amLODipine  (NORVASC ) 5 MG tablet Take 1 tablet (5 mg total) by mouth daily. (Patient not taking: Reported on 10/21/2022)   [DISCONTINUED] docusate sodium  (COLACE) 100 MG capsule Take 1 capsule (100 mg total) by mouth 2 (two) times daily. (Patient not taking: Reported on 10/24/2023)   [DISCONTINUED] enoxaparin  (LOVENOX ) 40 MG/0.4ML injection  Inject 0.4 mLs (40 mg total) into the skin daily for 14 days.   [DISCONTINUED] ondansetron  (ZOFRAN ) 4 MG tablet Take 1 tablet (4 mg total) by mouth every 6 (six) hours as needed for nausea.   [DISCONTINUED] traMADol  (ULTRAM ) 50 MG tablet Take 1 tablet (50 mg total) by mouth every 6 (six) hours as needed for moderate pain.   No facility-administered encounter medications on file as of 10/24/2023.    Past Surgical History:  Procedure Laterality Date   BREAST BIOPSY Right 06/25/2020   affirm bx calc, coil marker, path  pending   COLONOSCOPY WITH PROPOFOL  N/A 11/28/2017   Procedure: COLONOSCOPY WITH PROPOFOL ;  Surgeon: Selena Daily, MD;  Location: [facility] ENDOSCOPY;  Service: Gastroenterology;  Laterality: N/A;   COLONOSCOPY WITH PROPOFOL  N/A 05/22/2020   Procedure: COLONOSCOPY WITH PROPOFOL ;  Surgeon: Selena Daily, MD;  Location: [facility] ENDOSCOPY;  Service: Gastroenterology;  Laterality: N/A;   INGUINAL HERNIA REPAIR Bilateral    KNEE ARTHROSCOPY Bilateral    TOTAL KNEE ARTHROPLASTY Right 10/21/2022   Procedure: TOTAL KNEE ARTHROPLASTY;  Surgeon: Venus Ginsberg, MD;  Location: [facility] ORS;  Service: Orthopedics;  Laterality: Right;   TUBAL LIGATION      ROS As per HPI    Objective    BP (!) 140/70   Pulse 62   Ht 5\' 8"  (1.727 m)   Wt 147 lb 6.4 oz (66.9 kg)   SpO2 95%   BMI 22.41 kg/m     10/24/2023    3:03 PM 10/07/2022    9:04 AM 05/21/2021    8:29 AM  Depression screen PHQ 2/9  Decreased Interest 3 0 0  Down, Depressed, Hopeless 0 0 0  PHQ - 2 Score 3 0 0  Altered sleeping 0 0   Tired, decreased energy 0 0   Change in appetite 0 0   Feeling bad or failure about yourself  0 0   Trouble concentrating 0 0   Moving slowly or fidgety/restless 0 0   Suicidal thoughts 0 0   PHQ-9 Score 3 0   Difficult doing work/chores Not difficult at all Not difficult at all       10/24/2023    3:04 PM  GAD 7 : Generalized Anxiety Score  Nervous, Anxious, on Edge 0  Control/stop worrying 0  Worry too much - different things 0  Trouble relaxing 0  Restless 0  Easily annoyed or irritable 0  Afraid - awful might happen 0  Total GAD 7 Score 0  Anxiety Difficulty Not difficult at all    SDOH Screenings   Food Insecurity: No Food Insecurity (10/21/2022)  Housing: Patient Declined (10/21/2022)  Transportation Needs: No Transportation Needs (10/21/2022)  Utilities: Not At Risk (10/21/2022)  Alcohol Screen: Low Risk  (05/21/2021)  Depression (PHQ2-9): Low Risk  (10/24/2023)  Financial Resource Strain: Low Risk   (05/21/2021)  Physical Activity: Inactive (05/21/2021)  Social Connections: Moderately Isolated (05/21/2021)  Stress: No Stress Concern Present (05/21/2021)  Tobacco Use: Medium Risk (10/24/2023)     Physical Exam Constitutional:      General: She is not in acute distress.    Appearance: Normal appearance.  HENT:     Head: Normocephalic and atraumatic.     Right Ear: Tympanic membrane normal.     Left Ear: Tympanic membrane normal.     Mouth/Throat:     Mouth: Mucous membranes are moist.  Neck:     Thyroid: No thyroid mass or thyroid tenderness.  Cardiovascular:  Rate and Rhythm: Normal rate and regular rhythm.  Pulmonary:     Effort: Pulmonary effort is normal.     Breath sounds: Normal breath sounds. No wheezing.  Abdominal:     General: Bowel sounds are normal.     Palpations: Abdomen is soft.     Tenderness: There is no abdominal tenderness. There is no guarding or rebound.  Musculoskeletal:     Cervical back: Neck supple. No rigidity.     Right lower leg: No edema.     Left lower leg: No edema.  Skin:    General: Skin is warm.     Coloration: Skin is not jaundiced.  Neurological:     Mental Status: She is alert and oriented to person, place, and time.     Cranial Nerves: No dysarthria.     Comments: antalgic gait  Psychiatric:        Mood and Affect: Mood normal.        Behavior: Behavior normal.        Lab Results  Component Value Date   TSH 2.763 04/27/2020   Lab Results  Component Value Date   WBC 4.8 10/14/2023   HGB 12.3 10/14/2023   HCT 39.0 10/14/2023   MCV 92.9 10/14/2023   PLT 280 10/14/2023   Lab Results  Component Value Date   NA 134 (L) 10/14/2023   K 4.4 10/14/2023   CO2 19 (L) 10/14/2023   GLUCOSE 79 10/14/2023   BUN 13 10/14/2023   CREATININE 0.90 10/14/2023   BILITOT 1.3 (H) 10/14/2023   ALKPHOS 122 10/14/2023   AST 37 10/14/2023   ALT 17 10/14/2023   PROT 7.3 10/14/2023   ALBUMIN 3.5 10/14/2023   CALCIUM  8.4 (L) 10/14/2023    ANIONGAP 12 10/14/2023   Lab Results  Component Value Date   CHOL 119 11/19/2020   CHOL 127 01/16/2020   CHOL 133 04/19/2018   Lab Results  Component Value Date   HDL 41 (L) 11/19/2020   HDL 40 (L) 01/16/2020   HDL 46 (L) 04/19/2018   Lab Results  Component Value Date   LDLCALC 64 11/19/2020   LDLCALC 73 01/16/2020   LDLCALC 72 04/19/2018   Lab Results  Component Value Date   TRIG 65 11/19/2020   TRIG 65 01/16/2020   TRIG 72 04/19/2018   Lab Results  Component Value Date   CHOLHDL 2.9 11/19/2020   CHOLHDL 3.2 01/16/2020   CHOLHDL 2.9 04/19/2018   No results found for: "HGBA1C" Assessment & Plan:  Essential hypertension Assessment & Plan: BP above goal, will start her on Amlodipine  2.5 mg daily along with DASH diet, exercise, cutting down on alcohol intake. Check home BP three times a week, f/u in 2 months with home BP log.   Orders: -     TSH -     amLODIPine  Besylate; Take 1 tablet (2.5 mg total) by mouth daily.  Dispense: 90 tablet; Refill: 0  Abnormal mammogram Assessment & Plan: Abnormal mammogram on 06/25/20, recommend b/l diagnostic mammogram.   Orders: -     MM 3D DIAGNOSTIC MAMMOGRAM BILATERAL BREAST; Future  Mixed hyperlipidemia Assessment & Plan: Check lipid panel today, currently not on statin. Has tried atorvastatin  and Zetia  in the past. If elevated recommend restarting statin. Lifestyle modifications also discussed.   Orders: -     Lipid panel  Postmenopausal estrogen deficiency Assessment & Plan: Has a h/o osteopenia, check DEXA, ordered. Also check for vitamin D level  Orders: -  DG Bone Density; Future  Pulmonary nodule Assessment & Plan: Recommend repeating low dose CT lungs. Imaging ordered.  Consider PFT, referral to pulmonology in the future.   Orders: -     Ambulatory Referral for Lung Cancer Scre  Urinary tract infection without hematuria, site unspecified Assessment & Plan: Completed 7 days twice a day course of  Cephalexin  500 mg. Patient feeling better. Repeat UA.   Orders: -     Urinalysis, Routine w reflex microscopic  Encounter for screening for lung cancer Assessment & Plan: Check A1c today   Concern about memory Assessment & Plan: Son reports intermittent change in memory surrounding illness like recent UTI. Requesting neurology evaluation. Will check B12, folate, vitamin d level today. Also reviewed CT head from 5/40/25: Chronic microvascular ischemic changes, right cerebral hemi with punctate calci sus for embolized, calcified atherosclerotic plaque. Recommend optimizing blood pressure, lipid panel. Referral to [facility] neurology made today.   Orders: -     VITAMIN D 25 Hydroxy (Vit-D Deficiency, Fractures) -     B12 and Folate Panel -     Ambulatory referral to Neurology  Total bilirubin, elevated Assessment & Plan: Evident on 10/04/23, repeat CMP, asymptomatic  Orders: -     Comprehensive metabolic panel with GFR -     Gamma GT  Encounter for screening examination for impaired glucose regulation and diabetes mellitus Assessment & Plan: Check A1c today  Orders: -     Hemoglobin A1c -     Ambulatory Referral for Lung Cancer Scre  Hyponatremia Assessment & Plan: Check CMP.  Counseled on reducing/quitting alcohol intake.   If continues to be hyponatremic recommend f/u with nephrology.   Orders: -     Comprehensive metabolic panel with GFR  Multiple thyroid nodules Assessment & Plan: Asymptomatic, check TSH. Reviewed result from 06/11/20, not meeting criteria for FNA or imaging f/u. No difficulty swallowing, breathing.   Orders: -     TSH  Centrilobular emphysema (HCC) Assessment & Plan: Incidental on CT chest from 07/14/20, asymptomatic. Discussed potentially getting PFTs in the future. Patient declined at this time, reconsider during f/u.    Hepatic cirrhosis, unspecified hepatic cirrhosis type, unspecified whether ascites present [facility]) Assessment &  Plan: Incidental on han 2018. Mildly elevated total bili during recent ED visit. Patient asymptomatic. Repeat CMP. Consider RUQ ultrasound, GI referral in the future. Counseled on limiting or even better quitting alcohol.    Thoracic aortic atherosclerosis [facility]) Assessment & Plan: CT chest on 07/14/20. Not on statin. Pending lipid panel, consider restarting Atorvastatin  80 mfg daily. Repeat CT chest ordered.   Immunization declined Assessment & Plan: Qualifies for pneumonia, shingles immunization. Declines pneumonia immunization today.  If you are interested in the shingles vaccine series (Shingrix), call your insurance or pharmacy to check on coverage and location it must be given.  If affordable - you can schedule it here or at your pharmacy depending on coverage.     I spent 60 minutes on the day of this face-to-face encounter reviewing the patient's medical and surgical history, medications, ongoing concerns, and reviewing the assessment and plan with the patient and her son in detail. This time also included counseling the patient on their health conditions and management options. Additionally, I spent time post-visit ordering and reviewing diagnostics and therapeutics with the patient.   Return in about 8 weeks (around 12/19/2023) for BP and chronic f/u.   Jacklin Mascot, MD

## 2023-10-24 NOTE — Patient Instructions (Addendum)
 1) If you are interested in the shingles vaccine series (Shingrix), call your insurance or pharmacy to check on coverage and location it must be given.  If affordable - you can schedule it here or at your pharmacy depending on coverage.    2) We will order mammogram. Please call this number to schedule an appointment for [facility] - call [phone]   3) You are due for bone density check, I have ordered that. When you call to schedule an appointment for mammogram ask if you can get DEXA done at the same time.   4) I am ordering low dose CT to follow up on the lung nodules as well as to screen for lung cancer. If you do not hear form the scheduling department in 2 weeks please let me know.   5) We will start you on Amlodipine  2.5 mg once a day. Please check home blood pressure, three times a week and keep a blood pressure log. We will review this together. If your BP drops below 100/60 mmHg please reach out to our office.   6) In the future we should really be looking into doing a breathing test to see if you need treatment for COPD and evaluate your lung function.   7) If you do not hear from [facility] neurology in 2 weeks please reach out to our clinic.

## 2023-10-24 NOTE — Assessment & Plan Note (Signed)
 Has a h/o osteopenia, check DEXA, ordered. Also check for vitamin D level

## 2023-10-24 NOTE — Assessment & Plan Note (Signed)
 Evident on 10/04/23, repeat CMP, asymptomatic

## 2023-10-24 NOTE — Assessment & Plan Note (Signed)
 Check lipid panel today, currently not on statin. Has tried atorvastatin  and Zetia  in the past. If elevated recommend restarting statin. Lifestyle modifications also discussed.

## 2023-10-24 NOTE — Assessment & Plan Note (Signed)
 Son reports intermittent change in memory surrounding illness like recent UTI. Requesting neurology evaluation. Will check B12, folate, vitamin d level today. Also reviewed CT head from 5/40/25: Chronic microvascular ischemic changes, right cerebral hemi with punctate calci sus for embolized, calcified atherosclerotic plaque. Recommend optimizing blood pressure, lipid panel. Referral to [facility] neurology made today.

## 2023-10-24 NOTE — Assessment & Plan Note (Signed)
>>  ASSESSMENT AND PLAN FOR ABNORMAL MAMMOGRAM WRITTEN ON 10/24/2023  4:48 PM BY Mckinley Adelstein, MD  Abnormal mammogram on 06/25/20, recommend b/l diagnostic mammogram.

## 2023-10-24 NOTE — Assessment & Plan Note (Signed)
 Completed 7 days twice a day course of Cephalexin  500 mg. Patient feeling better. Repeat UA.

## 2023-10-24 NOTE — Assessment & Plan Note (Signed)
 Check CMP.  Counseled on reducing/quitting alcohol intake.   If continues to be hyponatremic recommend f/u with nephrology.

## 2023-10-24 NOTE — Assessment & Plan Note (Signed)
 Check A1c today.

## 2023-10-25 ENCOUNTER — Ambulatory Visit: Payer: Self-pay

## 2023-10-25 DIAGNOSIS — E782 Mixed hyperlipidemia: Secondary | ICD-10-CM

## 2023-10-25 DIAGNOSIS — E559 Vitamin D deficiency, unspecified: Secondary | ICD-10-CM | POA: Insufficient documentation

## 2023-10-25 LAB — COMPREHENSIVE METABOLIC PANEL WITH GFR
ALT: 13 IU/L (ref 0–32)
AST: 28 IU/L (ref 0–40)
Albumin: 4.1 g/dL (ref 3.8–4.8)
Alkaline Phosphatase: 121 IU/L (ref 44–121)
BUN/Creatinine Ratio: 19 (ref 12–28)
BUN: 15 mg/dL (ref 8–27)
Bilirubin Total: 0.3 mg/dL (ref 0.0–1.2)
CO2: 23 mmol/L (ref 20–29)
Calcium: 8.8 mg/dL (ref 8.7–10.3)
Chloride: 98 mmol/L (ref 96–106)
Creatinine, Ser: 0.81 mg/dL (ref 0.57–1.00)
Globulin, Total: 3.4 g/dL (ref 1.5–4.5)
Glucose: 85 mg/dL (ref 70–99)
Potassium: 4.6 mmol/L (ref 3.5–5.2)
Sodium: 133 mmol/L — ABNORMAL LOW (ref 134–144)
Total Protein: 7.5 g/dL (ref 6.0–8.5)
eGFR: 75 mL/min/{1.73_m2} (ref >59)

## 2023-10-25 LAB — URINALYSIS, ROUTINE W REFLEX MICROSCOPIC
Bilirubin, UA: NEGATIVE
Glucose, UA: NEGATIVE
Ketones, UA: NEGATIVE
Leukocytes,UA: NEGATIVE
Nitrite, UA: NEGATIVE
Protein,UA: NEGATIVE
RBC, UA: NEGATIVE
Specific Gravity, UA: 1.014 (ref 1.005–1.030)
Urobilinogen, Ur: 0.2 mg/dL (ref 0.2–1.0)
pH, UA: 5.5 (ref 5.0–7.5)

## 2023-10-25 LAB — LIPID PANEL
Chol/HDL Ratio: 5 ratio — ABNORMAL HIGH (ref 0.0–4.4)
Cholesterol, Total: 225 mg/dL — ABNORMAL HIGH (ref 100–199)
HDL: 45 mg/dL (ref >39)
LDL Chol Calc (NIH): 156 mg/dL — ABNORMAL HIGH (ref 0–99)
Triglycerides: 132 mg/dL (ref 0–149)
VLDL Cholesterol Cal: 24 mg/dL (ref 5–40)

## 2023-10-25 LAB — B12 AND FOLATE PANEL
Folate: 7.9 ng/mL (ref >3.0)
Vitamin B-12: 313 pg/mL (ref 232–1245)

## 2023-10-25 LAB — HEMOGLOBIN A1C
Est. average glucose Bld gHb Est-mCnc: 105 mg/dL
Hgb A1c MFr Bld: 5.3 % (ref 4.8–5.6)

## 2023-10-25 LAB — TSH: TSH: 1.57 u[IU]/mL (ref 0.450–4.500)

## 2023-10-25 LAB — GAMMA GT: GGT: 7 IU/L (ref 0–60)

## 2023-10-25 LAB — VITAMIN D 25 HYDROXY (VIT D DEFICIENCY, FRACTURES): Vit D, 25-Hydroxy: 17 ng/mL — ABNORMAL LOW (ref 30.0–100.0)

## 2023-10-25 MED ORDER — VITAMIN D (ERGOCALCIFEROL) 1.25 MG (50000 UNIT) PO CAPS: 50000.0000 [IU] | ORAL_CAPSULE | ORAL | 0 refills | Status: DC

## 2023-10-25 MED ORDER — ATORVASTATIN CALCIUM 40 MG PO TABS: 40.0000 mg | ORAL_TABLET | Freq: Every day | ORAL | 3 refills | Status: DC

## 2023-10-25 NOTE — Progress Notes (Signed)
 Please call patient's son Angus Bark, phone number: [phone] and update him on following lab results:  - Low vitamin D: Recommend starting once weekly 50000 units vitamin D for 2 months then repeat vitamin D level in about 2-3 months. Once she is done with prescription strength vitamin D start taking over the counter 1000 units vitamins D daily. Prescription sent.   - You continue to have mildly low sodium.  During your appointment we discussed staying hydrated. Due to this low serum sodium I recommend you limit fluid intake to less than 1.5 liters per day. This will help reduce further low sodium episode. I also recommend you reach out to nephrologist you have seen in the past to schedule a follow up appointment to further evaluate this low sodium:  Dr. Gari Junior with [facility], [place] [address], [address] [place], [place] [address] [phone]  If you are not able to see him within next two weeks I recommend we recheck your sodium level in 2 weeks.   - Your liver function, serum calcium  is normalized. Urine looks good. You are not diabetic. Your thyroid hormone level, B12 looks good.   - You do have elevated cholesterol and I recommend restarting cholesterol lowering medication called Atorvastatin  40 mg once a day. You have tried this prescription in the past. I recommend we repeat your cholesterol level and liver function in about 2-3 months. Prescription sent.   Thank you,  Jacklin Mascot, MD

## 2023-10-28 ENCOUNTER — Other Ambulatory Visit: Payer: Self-pay

## 2023-10-28 DIAGNOSIS — R928 Other abnormal and inconclusive findings on diagnostic imaging of breast: Secondary | ICD-10-CM

## 2023-10-28 DIAGNOSIS — N63 Unspecified lump in unspecified breast: Secondary | ICD-10-CM

## 2023-11-02 ENCOUNTER — Other Ambulatory Visit

## 2023-11-02 ENCOUNTER — Inpatient Hospital Stay: Admission: RE | Admit: 2023-11-02 | Source: Ambulatory Visit

## 2023-11-03 ENCOUNTER — Telehealth: Payer: Self-pay

## 2023-11-03 DIAGNOSIS — R911 Solitary pulmonary nodule: Secondary | ICD-10-CM

## 2023-11-03 NOTE — Telephone Encounter (Signed)
 I have reordered low dose CT for lung cancer screening. If they don't hear back from radiology in about 1-2 weeks we will reach out to the radiology department. Otherwise agree your Destiny's recommendations.   Jacklin Mascot, MD

## 2023-11-03 NOTE — Addendum Note (Signed)
 Addended by: Dayshaun Whobrey on: 11/03/2023 03:37 PM   Modules accepted: Orders

## 2023-11-03 NOTE — Telephone Encounter (Signed)
 Copied from CRM [id number]. Topic: Clinical - Medical Advice >> Nov 03, 2023 12:54 PM Julie Beck wrote: Reason for CRM: pt called to speak with provider regarding scheduling appts. Pt states she is now free and the provider can schedule her for what ever she needs as a new pt. Pt is requesting a call back at [phone]

## 2023-11-03 NOTE — Telephone Encounter (Signed)
 Spoke with patient and her son in regards to her telephone encounter regarding appointment. Pt says she has not heard from anyone in regards to getting her appointments scheduled. Patient was given [facility] and [facility] contact information to reach out. Patient son says his mother has been missing phone calls due to thinking they were spam calls. Patient says she has not heard anything in regards to getting scheduled for her CT lung screening. Please advise?

## 2023-11-03 NOTE — Telephone Encounter (Signed)
 Noted

## 2023-11-09 ENCOUNTER — Ambulatory Visit
Admission: RE | Admit: 2023-11-09 | Discharge: 2023-11-09 | Discharge status: Home / Self Care | Source: Ambulatory Visit

## 2023-11-09 ENCOUNTER — Ambulatory Visit
Admission: RE | Admit: 2023-11-09 | Discharge: 2023-11-09 | Disposition: A | Discharge status: Home / Self Care | Source: Ambulatory Visit

## 2023-11-09 ENCOUNTER — Ambulatory Visit: Payer: Self-pay

## 2023-11-09 DIAGNOSIS — R928 Other abnormal and inconclusive findings on diagnostic imaging of breast: Secondary | ICD-10-CM | POA: Insufficient documentation

## 2023-11-09 DIAGNOSIS — N63 Unspecified lump in unspecified breast: Secondary | ICD-10-CM

## 2023-11-09 NOTE — Progress Notes (Signed)
 Noted.  Julie Mascot, MD

## 2023-11-21 ENCOUNTER — Ambulatory Visit

## 2023-11-24 ENCOUNTER — Telehealth: Payer: Self-pay

## 2023-11-24 NOTE — Telephone Encounter (Signed)
 Received a referral message from Dr Abbey to reach out to patient, as she has missed several telephone attempts to get her scheduled for Lung Cancer Screening. Spoke with patient and provided her with [facility] contact information [phone]). Patient verbalized understanding and wrote down contact information and says she will reach out right now to get scheduled for an appointment.

## 2023-12-01 ENCOUNTER — Ambulatory Visit: Admission: RE | Admit: 2023-12-01 | Source: Ambulatory Visit

## 2023-12-17 ENCOUNTER — Other Ambulatory Visit: Payer: Self-pay

## 2023-12-17 DIAGNOSIS — E559 Vitamin D deficiency, unspecified: Secondary | ICD-10-CM

## 2023-12-19 ENCOUNTER — Ambulatory Visit

## 2023-12-20 NOTE — Telephone Encounter (Signed)
 Got Refill request for vitamin D  50000 units on 12/20/23. Has apt with me on 12/22/23, refill not appropriate at this time. We will get labs repeated before sending refill.   Luke Shade, MD

## 2023-12-22 ENCOUNTER — Ambulatory Visit (INDEPENDENT_AMBULATORY_CARE_PROVIDER_SITE_OTHER)

## 2023-12-22 VITALS — BP 140/70 | HR 72 | Temp 98.5°F | Resp 17 | Ht 68.0 in | Wt 146.4 lb

## 2023-12-22 DIAGNOSIS — N6002 Solitary cyst of left breast: Secondary | ICD-10-CM | POA: Diagnosis not present

## 2023-12-22 DIAGNOSIS — E782 Mixed hyperlipidemia: Secondary | ICD-10-CM

## 2023-12-22 DIAGNOSIS — I10 Essential (primary) hypertension: Secondary | ICD-10-CM

## 2023-12-22 DIAGNOSIS — R928 Other abnormal and inconclusive findings on diagnostic imaging of breast: Secondary | ICD-10-CM | POA: Diagnosis not present

## 2023-12-22 DIAGNOSIS — E871 Hypo-osmolality and hyponatremia: Secondary | ICD-10-CM

## 2023-12-22 DIAGNOSIS — E559 Vitamin D deficiency, unspecified: Secondary | ICD-10-CM

## 2023-12-22 DIAGNOSIS — Z711 Person with feared health complaint in whom no diagnosis is made: Secondary | ICD-10-CM

## 2023-12-22 DIAGNOSIS — R911 Solitary pulmonary nodule: Secondary | ICD-10-CM

## 2023-12-22 MED ORDER — AMLODIPINE BESYLATE 5 MG PO TABS: 5.0000 mg | ORAL_TABLET | Freq: Every day | ORAL | 3 refills | Status: DC

## 2023-12-22 NOTE — Assessment & Plan Note (Signed)
 Recommend repeating low dose CT lungs. Imaging ordered during last visit. Patient does not pick up call from unknown number. Phone number to call to schedule an appointment provided during today's visit.

## 2023-12-22 NOTE — Assessment & Plan Note (Signed)
 BP above goal today. Increase dose of Amlodipine  from 2.5 mg once daily to 5 mg once daily. She can finish current 2.5 mg (take 2 tab) till she picks up new refill for Amlodipine  5 mg (take one tablet daily).   Check BP three times a week. Goal BP <130/80 mmHg.   F/U in 4 months with home BP reading

## 2023-12-22 NOTE — Progress Notes (Signed)
 Established Patient Office Visit   Subjective  Patient ID: Julie Beck, female    DOB: Oct 03, 1945  Age: 78 y.o. MRN: [id number]  Chief Complaint  Patient presents with   Medical Management of Chronic Issues    BP recheck    She  has a past medical history of Abnormal CT of liver (07/12/2016), Abnormal EKG (03/17/2016), Arthritis, Centrilobular emphysema (HCC) (07/12/2016), Coronary artery disease (07/12/2016), Epigastric abdominal pain (03/17/2016), GERD (gastroesophageal reflux disease) (03/17/2016), History of kidney stones, Hyperlipidemia, Overweight (BMI 25.0-29.9) (03/17/2016), Personal history of tobacco use, presenting hazards to health (06/11/2016), S/P TKR (total knee replacement) using cement, right (10/21/2022), SIADH (syndrome of inappropriate ADH production) (HCC), Thoracic aortic atherosclerosis (HCC) (07/12/2016), and Tobacco abuse (03/17/2016).  HPI Discussed the use of AI scribe software for clinical note transcription with the patient, who gave verbal consent to proceed.  History of Present Illness Julie Beck is a 78 year old female with hypertension, vitamin D  deficiency, hyperlipidemia, chronic hyponatremia who presents for a follow-up visit, accompanied by her son.   She is managing her hypertension with amlodipine  2.5 mg daily in the morning without missing doses. Her blood pressure was recorded at 154/80 mmHg. She denies chest pain, palpitation, lower leg edema.   She is also managing her cholesterol with atorvastatin  40 mg taken at night.   She is done with vitamin D  50000 units once weekly for vitamin D  deficiency. She takes over-the-counter vitamin D , 1000 units daily, with calcium .  Her past medical history includes a chronically low sodium level, but she is asymptomatic with no dizziness or swelling in her legs. She has not called to schedule an appointment with [facility].   She underwent screening mammogram on 11/09/23 and ultrasound of left breast for  benign left breast cysts, with a follow-up mammogram recommended in June 2026.  She remains active, still working, driving, and maintaining her yard.   She has upcoming appointments with a neurologist at [facility] on 01/02/24. She is due for a low-dose CT scan for lung cancer screening. She has been experiencing issues with receiving calls from her doctors due to spam call filtering.  ROS As per HPI    Objective:     BP (!) 140/70 (BP Location: Right Arm, Cuff Size: Small)   Pulse 72   Temp 98.5 F (36.9 C) (Oral)   Resp 17   Ht 5' 8 (1.727 m)   Wt 146 lb 6 oz (66.4 kg)   SpO2 97%   BMI 22.26 kg/m      12/22/2023    3:44 PM 10/24/2023    3:03 PM 10/07/2022    9:04 AM  Depression screen PHQ 2/9  Decreased Interest 0 3 0  Down, Depressed, Hopeless 0 0 0  PHQ - 2 Score 0 3 0  Altered sleeping 0 0 0  Tired, decreased energy 0 0 0  Change in appetite 0 0 0  Feeling bad or failure about yourself  0 0 0  Trouble concentrating 0 0 0  Moving slowly or fidgety/restless 0 0 0  Suicidal thoughts 0 0 0  PHQ-9 Score 0 3 0  Difficult doing work/chores Not difficult at all Not difficult at all Not difficult at all      12/22/2023    3:44 PM 10/24/2023    3:04 PM  GAD 7 : Generalized Anxiety Score  Nervous, Anxious, on Edge 0 0  Control/stop worrying 0 0  Worry too much - different things 0 0  Trouble relaxing 0 0  Restless 0 0  Easily annoyed or irritable 0 0  Afraid - awful might happen 0 0  Total GAD 7 Score 0 0  Anxiety Difficulty Not difficult at all Not difficult at all      12/22/2023    3:44 PM 10/24/2023    3:03 PM 10/07/2022    9:04 AM  Depression screen PHQ 2/9  Decreased Interest 0 3 0  Down, Depressed, Hopeless 0 0 0  PHQ - 2 Score 0 3 0  Altered sleeping 0 0 0  Tired, decreased energy 0 0 0  Change in appetite 0 0 0  Feeling bad or failure about yourself  0 0 0  Trouble concentrating 0 0 0  Moving slowly or fidgety/restless 0 0 0  Suicidal thoughts 0 0 0   PHQ-9 Score 0 3 0  Difficult doing work/chores Not difficult at all Not difficult at all Not difficult at all      12/22/2023    3:44 PM 10/24/2023    3:04 PM  GAD 7 : Generalized Anxiety Score  Nervous, Anxious, on Edge 0 0  Control/stop worrying 0 0  Worry too much - different things 0 0  Trouble relaxing 0 0  Restless 0 0  Easily annoyed or irritable 0 0  Afraid - awful might happen 0 0  Total GAD 7 Score 0 0  Anxiety Difficulty Not difficult at all Not difficult at all   SDOH Screenings   Food Insecurity: No Food Insecurity (11/02/2023)   Received from [facility]  Housing: Low Risk  (11/02/2023)   Received from [facility]  Transportation Needs: No Transportation Needs (11/02/2023)   Received from [facility]  Utilities: Not At Risk (11/02/2023)   Received from [facility]  Alcohol Screen: Low Risk  (05/21/2021)  Depression (PHQ2-9): Low Risk  (12/22/2023)  Financial Resource Strain: Low Risk  (11/02/2023)   Received from [facility]  Physical Activity: Inactive (05/21/2021)  Social Connections: Moderately Isolated (05/21/2021)  Stress: No Stress Concern Present (05/21/2021)  Tobacco Use: Medium Risk (12/22/2023)     Physical Exam HENT:     Head: Normocephalic and atraumatic.  Cardiovascular:     Rate and Rhythm: Normal rate.     Heart sounds: No murmur heard. Pulmonary:     Effort: Pulmonary effort is normal.     Breath sounds: Normal breath sounds.  Abdominal:     Palpations: Abdomen is soft.     Tenderness: There is no guarding.  Musculoskeletal:     Cervical back: No tenderness.     Right lower leg: No edema.     Left lower leg: No edema.  Skin:    General: Skin is warm.  Neurological:     Mental Status: She is alert and oriented to person, place, and time.     Gait: Gait abnormal (slow, wide based gait).  Psychiatric:        Mood and Affect: Mood normal.        No  results found for any visits on 12/22/23.  The 10-year ASCVD risk score (Arnett DK, et al., 2019) is: 27.1%     Assessment & Plan:  Patient also counseled on updating shingles, pneumonia, influenza/around September immunization. They will check with local pharmacy to update this.   Essential hypertension Assessment & Plan: BP above goal today. Increase dose of Amlodipine  from 2.5 mg once daily to 5 mg  once daily. She can finish current 2.5 mg (take 2 tab) till she picks up new refill for Amlodipine  5 mg (take one tablet daily).   Check BP three times a week. Goal BP <130/80 mmHg.   F/U in 4 months with home BP reading   Orders: -     amLODIPine  Besylate; Take 1 tablet (5 mg total) by mouth daily.  Dispense: 90 tablet; Refill: 3  Benign cyst of left breast Assessment & Plan:  >>ASSESSMENT AND PLAN FOR ABNORMAL MAMMOGRAM WRITTEN ON 12/22/2023  4:58 PM BY ABBEY BRUCKNER, MD  Left breast cyst as evident on mammogram and ultrasound on 11/09/23. Will continue annual screening.    Abnormal mammogram  Concern about memory Assessment & Plan: Upcoming appointment with neurologist at [facility] on 01/02/24.  CT head from 09/2023: Chronic microvascular ischemic changes and mild parenchymal volume loss. Similar punctate calcifications over the right cerebral hemisphere which may reflect small foci of embolized calcified atherosclerotic Plaque.  Counseled patient and her son on risk factor management including BP, cholesterol control.     Mixed hyperlipidemia Assessment & Plan: Continue Atorvastatin  40 mg at bedtime    Vitamin D  deficiency Assessment & Plan: Take 1000 units of vitamin D , over the counter, repeat vitamin D  level during next lab in about 4 months.    Pulmonary nodule Assessment & Plan: Recommend repeating low dose CT lungs. Imaging ordered during last visit. Patient does not pick up call from unknown number. Phone number to call to schedule an appointment provided  during today's visit.    Chronic hyponatremia Assessment & Plan: Patient is asymptomatic, will continue to monitor lab periodically. Also counseled to schedule appointment with nephrologist, phone number given to schedule an appointment.     Return in about 4 months (around 04/22/2024) for Chronic follow up, repeat lipid, cmp, vit d, b12.   BRUCKNER ABBEY, MD

## 2023-12-22 NOTE — Assessment & Plan Note (Addendum)
 Upcoming appointment with neurologist at [facility] on 01/02/24.  CT head from 09/2023: Chronic microvascular ischemic changes and mild parenchymal volume loss. Similar punctate calcifications over the right cerebral hemisphere which may reflect small foci of embolized calcified atherosclerotic Plaque.  Counseled patient and her son on risk factor management including BP, cholesterol control.

## 2023-12-22 NOTE — Patient Instructions (Addendum)
 1) Kidney doctor: Please call Dr. Douglas, Sarath's office to schedule an appointment with him.  Your last appointment with him was on 01/26/2023. [address]  [place], [place] [address]  [phone] (Work)  2) Your neurology appointment is on 01/02/24 at [facility]. Please call the following number to confirm appointment time:  Lane Arthea Locus, MD, Neurology:  [address] [place] [address] Phone: [phone]  3) You are due for shingles immunization. This is a two dose vaccine, you can update this through your pharmacy. Please discuss with your pharmacist to update your pneumonia immunization.   4) Let's check your BP three times a week, please keep a log and let's review it together when I see you next time. Please take 2 tablets of Amlodipine  2.5 mg at bedtime. I will send new prescription of Amlodipine  5 mg once a day once you run out of current medication.   5) Continue to take your cholesterol medicine Lipitor, 1 tab at bedtime.   6) Continue to take over the counter vitamin D  daily.   7) Add a daily multivitamin.   8) Please call the following number to schedule for low dose CT for lung cancer screening:  [phone]

## 2023-12-22 NOTE — Assessment & Plan Note (Signed)
Continue Atorvastatin 40 mg at bedtime.

## 2023-12-22 NOTE — Assessment & Plan Note (Signed)
 Patient is asymptomatic, will continue to monitor lab periodically. Also counseled to schedule appointment with nephrologist, phone number given to schedule an appointment.

## 2023-12-22 NOTE — Assessment & Plan Note (Signed)
 Take 1000 units of vitamin D , over the counter, repeat vitamin D  level during next lab in about 4 months.

## 2023-12-22 NOTE — Assessment & Plan Note (Signed)
 Left breast cyst as evident on mammogram and ultrasound on 11/09/23. Will continue annual screening.

## 2023-12-22 NOTE — Assessment & Plan Note (Signed)
>>  ASSESSMENT AND PLAN FOR ABNORMAL MAMMOGRAM WRITTEN ON 12/22/2023  4:58 PM BY ABBEY BRUCKNER, MD  Left breast cyst as evident on mammogram and ultrasound on 11/09/23. Will continue annual screening.

## 2024-01-20 ENCOUNTER — Other Ambulatory Visit: Payer: Self-pay

## 2024-01-20 DIAGNOSIS — I10 Essential (primary) hypertension: Secondary | ICD-10-CM

## 2024-01-26 NOTE — Telephone Encounter (Unsigned)
 Copied from CRM [id number]. Topic: General - Other >> Jan 26, 2024  1:29 PM Berneda FALCON wrote: Reason for CRM: Pt's son states the medication know that this medication is good to go and there is no longer an issue with the pharmacy.  amLODipine  (NORVASC ) 5 MG tablet

## 2024-01-26 NOTE — Telephone Encounter (Unsigned)
 Copied from CRM [id number]. Topic: Clinical - Prescription Issue >> Jan 26, 2024  1:09 PM Turkey A wrote: Reason for CRM: Patient's son called and said that Dr.Bair has increased patient's amLODipine  (NORVASC ) 5 MG tablet to 10 mg -however the pharmacy does not have any record of that. Please call [phone] Julie Beck) Julie Beck

## 2024-04-25 ENCOUNTER — Ambulatory Visit

## 2024-04-25 VITALS — BP 130/70 | HR 70 | Temp 98.2°F | Ht 66.0 in | Wt 149.8 lb

## 2024-04-25 DIAGNOSIS — I10 Essential (primary) hypertension: Secondary | ICD-10-CM | POA: Diagnosis not present

## 2024-04-25 DIAGNOSIS — E782 Mixed hyperlipidemia: Secondary | ICD-10-CM

## 2024-04-25 DIAGNOSIS — R4189 Other symptoms and signs involving cognitive functions and awareness: Secondary | ICD-10-CM | POA: Diagnosis not present

## 2024-04-25 MED ORDER — DONEPEZIL HCL 5 MG PO TABS: 5.0000 mg | ORAL_TABLET | Freq: Every day | ORAL | 3 refills | Status: AC

## 2024-04-25 MED ORDER — AMLODIPINE BESYLATE 5 MG PO TABS: 5.0000 mg | ORAL_TABLET | Freq: Every day | ORAL | 3 refills | Status: AC

## 2024-04-25 MED ORDER — ATORVASTATIN CALCIUM 40 MG PO TABS: 40.0000 mg | ORAL_TABLET | Freq: Every day | ORAL | 3 refills | Status: AC

## 2024-04-25 NOTE — Assessment & Plan Note (Deleted)
 Julie Beck

## 2024-04-25 NOTE — Patient Instructions (Addendum)
 Kidney doctor:  Dr. Woodward Brought with [facility], [place]  [address]  [place], [place] [address]  [phone]    Neurologist:  Last visit was in 12/2023, he recommended 6 months follow up.  Lane Arthea Locus, MD  [phone] [facility] [address]  [facility]  [place], [place] [address]  [phone] (Work)    You are due for shingles, pneumonia vaccine.  Follow up with Dr. Abbey in 3 months. Fasting labs couple of days before appointment.    Please call the following number to schedule DEXA/bone scan:  YOUR BONE DENISTY SCAN (dexa)  IS DUE, PLEASE CALL AND GET THIS SCHEDULED! [facility] - call [phone]

## 2024-04-25 NOTE — Assessment & Plan Note (Addendum)
 Home BP readings reviewed which has been within goal. I recommend continue Amlodipine  5 mg daily.  Orders:   amLODipine  (NORVASC ) 5 MG tablet; Take 1 tablet (5 mg total) by mouth daily.

## 2024-04-25 NOTE — Assessment & Plan Note (Signed)
 She is doing well, saw Dr. Lane on 01/02/24, started on Aricept 5 mg daily. Since patient is having hard  time getting refill, I sent refill on Aricept 5 mg daily after reviewing note from Dr. Lane.  Orders:   donepezil (ARICEPT) 5 MG tablet; Take 1 tablet (5 mg total) by mouth at bedtime.

## 2024-04-25 NOTE — Assessment & Plan Note (Addendum)
 Continue Atorvastatin  40 mg daily. She needs fasting lipid panel which is already ordered. Recommend she gets this before her f/u visit in 3 months. Check hepatic function with next set of labs.  Orders:   atorvastatin  (LIPITOR) 40 MG tablet; Take 1 tablet (40 mg total) by mouth daily.

## 2024-04-25 NOTE — Progress Notes (Signed)
 Established Patient Office Visit   Subjective  Patient ID: Julie Beck, female    DOB: [birth year]  Age: 78 y.o. MRN: [id number]  Chief Complaint  Patient presents with   Hypertension   Hyperlipidemia    Discussed the use of AI scribe software for clinical note transcription with the patient, who gave verbal consent to proceed.  History of Present Illness Julie Beck is a 78 year old female with hypertension who presents for a follow-up visit.  - She has been managing her hypertension with amlodipine  5 mg daily.  Her blood pressure has been stable since her last visit, with recent readings around 130/68 mmHg. There was an incident where she used the blood pressure cuff incorrectly, leading to inaccurate high readings, but this has been resolved. She takes her blood pressure medication in the evening and her vitamins in the morning. No dizziness, chest pain, palpitations, or falls since her last visit.  - Last visit with neurology Dr. Lane on 01/02/24, 6 M f/u recommended, started her on Aricept 5 mg, but there was an issue with the prescription. She is awaiting the prescription to be sent to her pharmacy.  - She finished vitamin D  50,000 units weekly prescription and is on 1000 vitamin D  daily, which she gets over-the-counter.   - She remains physically active and has been maintaining her health regimen. She has not yet seen her nephrologist for a follow-up since her last visit in September of the previous year.  - Patient and her son requesting prescription be [facility] to [facility] [address] in [place]. They plan on discussing updating pneumonia and shingles vaccine at the pharmacy.   - She has not seen nephrologist Dr. Douglas yet for a follow up visit.     ROS As per HPI    Objective:     BP 130/70 (BP Location: Right Arm, Patient Position: Sitting, Cuff Size: Normal)   Pulse 70   Temp 98.2 F (36.8 C) (Oral)   Ht 5' 6 (1.676 m)   Wt 149 lb 12.8 oz (67.9 kg)    SpO2 92%   BMI 24.18 kg/m      04/25/2024    2:07 PM 12/22/2023    3:44 PM 10/24/2023    3:03 PM  Depression screen PHQ 2/9  Decreased Interest 0 0 3  Down, Depressed, Hopeless 0 0 0  PHQ - 2 Score 0 0 3  Altered sleeping 0 0 0  Tired, decreased energy 0 0 0  Change in appetite 0 0 0  Feeling bad or failure about yourself  0 0 0  Trouble concentrating 0 0 0  Moving slowly or fidgety/restless 0 0 0  Suicidal thoughts 0 0 0  PHQ-9 Score 0 0  3   Difficult doing work/chores Not difficult at all Not difficult at all Not difficult at all     Data saved with a previous flowsheet row definition      04/25/2024    2:07 PM 12/22/2023    3:44 PM 10/24/2023    3:04 PM  GAD 7 : Generalized Anxiety Score  Nervous, Anxious, on Edge 0 0 0  Control/stop worrying 0 0 0  Worry too much - different things 0 0 0  Trouble relaxing 0 0 0  Restless 0 0 0  Easily annoyed or irritable 0 0 0  Afraid - awful might happen 0 0 0  Total GAD 7 Score 0 0 0  Anxiety Difficulty Not difficult at  all Not difficult at all Not difficult at all      04/25/2024    2:07 PM 12/22/2023    3:44 PM 10/24/2023    3:03 PM  Depression screen PHQ 2/9  Decreased Interest 0 0 3  Down, Depressed, Hopeless 0 0 0  PHQ - 2 Score 0 0 3  Altered sleeping 0 0 0  Tired, decreased energy 0 0 0  Change in appetite 0 0 0  Feeling bad or failure about yourself  0 0 0  Trouble concentrating 0 0 0  Moving slowly or fidgety/restless 0 0 0  Suicidal thoughts 0 0 0  PHQ-9 Score 0 0  3   Difficult doing work/chores Not difficult at all Not difficult at all Not difficult at all     Data saved with a previous flowsheet row definition      04/25/2024    2:07 PM 12/22/2023    3:44 PM 10/24/2023    3:04 PM  GAD 7 : Generalized Anxiety Score  Nervous, Anxious, on Edge 0 0 0  Control/stop worrying 0 0 0  Worry too much - different things 0 0 0  Trouble relaxing 0 0 0  Restless 0 0 0  Easily annoyed or irritable 0 0 0  Afraid -  awful might happen 0 0 0  Total GAD 7 Score 0 0 0  Anxiety Difficulty Not difficult at all Not difficult at all Not difficult at all   SDOH Screenings   Food Insecurity: No Food Insecurity (11/02/2023)   Received from [facility]  Housing: Low Risk  (11/02/2023)   Received from [facility]  Transportation Needs: No Transportation Needs (11/02/2023)   Received from [facility]  Utilities: Not At Risk (11/02/2023)   Received from [facility]  Alcohol Screen: Low Risk  (05/21/2021)  Depression (PHQ2-9): Low Risk  (04/25/2024)  Financial Resource Strain: Low Risk  (11/02/2023)   Received from [facility]  Physical Activity: Inactive (05/21/2021)  Social Connections: Moderately Isolated (05/21/2021)  Stress: No Stress Concern Present (05/21/2021)  Tobacco Use: Medium Risk (04/25/2024)     Physical Exam Constitutional:      Appearance: Normal appearance.  HENT:     Head: Normocephalic and atraumatic.     Mouth/Throat:     Mouth: Mucous membranes are moist.  Neck:     Thyroid: No thyroid mass or thyroid tenderness.  Cardiovascular:     Rate and Rhythm: Normal rate and regular rhythm.  Pulmonary:     Effort: Pulmonary effort is normal.     Breath sounds: Normal breath sounds.  Abdominal:     General: Bowel sounds are normal.     Palpations: Abdomen is soft.  Musculoskeletal:     Cervical back: Neck supple. No rigidity.     Right lower leg: No edema.     Left lower leg: No edema.  Skin:    General: Skin is warm.  Neurological:     Mental Status: She is alert and oriented to person, place, and time.  Psychiatric:        Mood and Affect: Mood normal.        Behavior: Behavior normal.        No results found for any visits on 04/25/24.  The 10-year ASCVD risk score (Arnett DK, et al., 2019) is: 23.8%     Assessment & Plan:  Patient is a pleasant 78 year old female presenting for  chronic  follow up. She is doing well overall. I provided phone number to Dr. Douglas (nephrologist), Dr. Lane (neurologist) and phone number to schedule appointment for DEXA scan during today's visit. Continue vitamin D  1000 units. Repeat vitamin D  with next set of labs.  Assessment & Plan Essential hypertension Home BP readings reviewed which has been within goal. I recommend continue Amlodipine  5 mg daily.  Orders:   amLODipine  (NORVASC ) 5 MG tablet; Take 1 tablet (5 mg total) by mouth daily.  Mixed hyperlipidemia Continue Atorvastatin  40 mg daily. She needs fasting lipid panel which is already ordered. Recommend she gets this before her f/u visit in 3 months. Check hepatic function with next set of labs.  Orders:   atorvastatin  (LIPITOR) 40 MG tablet; Take 1 tablet (40 mg total) by mouth daily.  Concern about memory She is doing well, saw Dr. Lane on 01/02/24, started on Aricept 5 mg daily. Since patient is having hard  time getting refill, I sent refill on Aricept 5 mg daily after reviewing note from Dr. Lane.  Orders:   donepezil (ARICEPT) 5 MG tablet; Take 1 tablet (5 mg total) by mouth at bedtime.     Return in about 3 months (around 07/24/2024) for Chronic follow up and fasting labs couple of days before appointment(lipid, vitD, hepatic function).   Luke Shade, MD

## 2024-05-14 ENCOUNTER — Ambulatory Visit: Admitting: *Deleted

## 2024-05-14 VITALS — Ht 66.0 in | Wt 171.0 lb

## 2024-05-14 DIAGNOSIS — Z0000 Encounter for general adult medical examination without abnormal findings: Secondary | ICD-10-CM | POA: Diagnosis not present

## 2024-05-14 NOTE — Progress Notes (Signed)
 "  Chief Complaint  Patient presents with   Medicare Wellness     Subjective:   Julie Beck is a 78 y.o. female who presents for a Medicare Annual Wellness Visit.  Visit info / Clinical Intake: Medicare Wellness Visit Type:: Subsequent Annual Wellness Visit Persons participating in visit and providing information:: patient (Son, Thedora was with his mom) Medicare Wellness Visit Mode:: Telephone If telephone:: video declined Since this visit was completed virtually, some vitals may be partially provided or unavailable. Missing vitals are due to the limitations of the virtual format.: Unable to obtain vitals - no equipment If Telephone or Video please confirm:: I connected with patient using audio/video enable telemedicine. I verified patient identity with two identifiers, discussed telehealth limitations, and patient agreed to proceed. Patient Location:: Home Provider Location:: Office/Home Interpreter Needed?: No Pre-visit prep was completed: yes AWV questionnaire completed by patient prior to visit?: no Living arrangements:: (!) lives alone Patient's Overall Health Status Rating: very good Typical amount of pain: none Does pain affect daily life?: no Are you currently prescribed opioids?: no  Dietary Habits and Nutritional Risks How many meals a day?: 3 Eats fruit and vegetables daily?: yes Most meals are obtained by: preparing own meals In the last 2 weeks, have you had any of the following?: none Diabetic:: no  Functional Status Activities of Daily Living (to include ambulation/medication): Independent Ambulation: Independent Medication Administration: Independent Home Management (perform basic housework or laundry): Independent Manage your own finances?: yes Primary transportation is: driving Concerns about vision?: no *vision screening is required for WTM* Concerns about hearing?: no  Fall Screening Falls in the past year?: 0 Number of falls in past year: 0 Was  there an injury with Fall?: 0 Fall Risk Category Calculator: 0 Patient Fall Risk Level: Low Fall Risk  Fall Risk Patient at Risk for Falls Due to: No Fall Risks Fall risk Follow up: Falls evaluation completed; Falls prevention discussed  Home and Transportation Safety: All rugs have non-skid backing?: yes All stairs or steps have railings?: yes Grab bars in the bathtub or shower?: (!) no Have non-skid surface in bathtub or shower?: yes Good home lighting?: yes Regular seat belt use?: yes Hospital stays in the last year:: no  Cognitive Assessment Difficulty concentrating, remembering, or making decisions? : no Will 6CIT or Mini Cog be Completed: yes What year is it?: 0 points What month is it?: 0 points Give patient an address phrase to remember (5 components): [address], [place] About what time is it?: 0 points Count backwards from 20 to 1: 0 points Say the months of the year in reverse: 0 points Repeat the address phrase from earlier: 2 points 6 CIT Score: 2 points  Advance Directives (For Healthcare) Does Patient Have a Medical Advance Directive?: Yes Does patient want to make changes to medical advance directive?: No - Patient declined Type of Advance Directive: Healthcare Power of [place]; Living will Copy of Healthcare Power of Attorney in Chart?: No - copy requested Copy of Living Will in Chart?: No - copy requested  Reviewed/Updated  Reviewed/Updated: Reviewed All (Medical, Surgical, Family, Medications, Allergies, Care Teams, Patient Goals)    Allergies (verified) Sulfa antibiotics and Latex   Current Medications (verified) Outpatient Encounter Medications as of 05/14/2024  Medication Sig   acetaminophen  (TYLENOL ) 500 MG tablet Take 2 tablets (1,000 mg total) by mouth every 8 (eight) hours.   amLODipine  (NORVASC ) 5 MG tablet Take 1 tablet (5 mg total) by mouth daily.  atorvastatin  (LIPITOR) 40 MG tablet Take 1 tablet (40 mg total) by mouth daily.    cholecalciferol (VITAMIN D3) 25 MCG (1000 UNIT) tablet Take 1,000 Units by mouth daily.   donepezil  (ARICEPT ) 5 MG tablet Take 1 tablet (5 mg total) by mouth at bedtime.   Multiple Vitamin (MULTIVITAMIN) tablet Take 1 tablet by mouth daily.   No facility-administered encounter medications on file as of 05/14/2024.    History: Past Medical History:  Diagnosis Date   Abnormal CT of liver 07/12/2016   Chest CT Feb 2018   Abnormal EKG 03/17/2016   Arthritis    Centrilobular emphysema (HCC) 07/12/2016   Chest CT Feb 2018   Coronary artery disease 07/12/2016   Followed by Dr. Mady, seen on chest CT Feb 2018   Epigastric abdominal pain 03/17/2016   GERD (gastroesophageal reflux disease) 03/17/2016   History of kidney stones    Hyperlipidemia    Overweight (BMI 25.0-29.9) 03/17/2016   Personal history of tobacco use, presenting hazards to health 06/11/2016   S/P TKR (total knee replacement) using cement, right 10/21/2022   SIADH (syndrome of inappropriate ADH production)    Thoracic aortic atherosclerosis 07/12/2016   Chest CT scan Feb 2018   Tobacco abuse 03/17/2016   Past Surgical History:  Procedure Laterality Date   BREAST BIOPSY Right 06/25/2020   affirm bx calc, coil marker, path pending   COLONOSCOPY WITH PROPOFOL  N/A 11/28/2017   Procedure: COLONOSCOPY WITH PROPOFOL ;  Surgeon: Unk Corinn Skiff, MD;  Location: [facility] ENDOSCOPY;  Service: Gastroenterology;  Laterality: N/A;   COLONOSCOPY WITH PROPOFOL  N/A 05/22/2020   Procedure: COLONOSCOPY WITH PROPOFOL ;  Surgeon: Unk Corinn Skiff, MD;  Location: [facility] ENDOSCOPY;  Service: Gastroenterology;  Laterality: N/A;   INGUINAL HERNIA REPAIR Bilateral    KNEE ARTHROSCOPY Bilateral    TOTAL KNEE ARTHROPLASTY Right 10/21/2022   Procedure: TOTAL KNEE ARTHROPLASTY;  Surgeon: Lorelle Hussar, MD;  Location: [facility] ORS;  Service: Orthopedics;  Laterality: Right;   TUBAL LIGATION     Family History  Problem Relation Age of Onset    Alzheimer's disease Mother    Breast cancer Mother    Cancer Father        lung   Dementia Sister    Cancer Maternal Grandmother        breast   Breast cancer Maternal Grandmother    Heart attack Maternal Grandfather    Healthy Sister    Breast cancer Maternal Aunt    Bladder Cancer Neg Hx    Kidney cancer Neg Hx    Social History   Occupational History    Employer: OTHER    Comment: hairdresser  Tobacco Use   Smoking status: Former    Current packs/day: 0.00    Average packs/day: 1 pack/day for 52.0 years (52.0 ttl pk-yrs)    Types: Cigarettes    Start date: 04/26/1967    Quit date: 04/26/2019    Years since quitting: 5.0   Smokeless tobacco: Never   Tobacco comments:    quit April 25, 2020 most years 1 ppd, and more recently 1/2 ppd, roughly 40-45 pack years  Vaping Use   Vaping status: Never Used  Substance and Sexual Activity   Alcohol use: Yes    Comment: socially   Drug use: No   Sexual activity: Not Currently    Partners: Female   Tobacco Counseling Counseling given: Not Answered Tobacco comments: quit April 25, 2020 most years 1 ppd, and more recently 1/2 ppd, roughly 40-45 pack years  SDOH Screenings   Food Insecurity: No Food Insecurity (05/14/2024)  Housing: Low Risk (05/14/2024)  Transportation Needs: No Transportation Needs (05/14/2024)  Utilities: Not At Risk (05/14/2024)  Alcohol Screen: Low Risk (05/14/2024)  Depression (PHQ2-9): Low Risk (05/14/2024)  Financial Resource Strain: Low Risk (05/14/2024)  Physical Activity: Inactive (05/14/2024)  Social Connections: Socially Isolated (05/14/2024)  Stress: No Stress Concern Present (05/14/2024)  Tobacco Use: Medium Risk (05/14/2024)  Health Literacy: Adequate Health Literacy (05/14/2024)   See flowsheets for full screening details  Depression Screen PHQ 2 & 9 Depression Scale- Over the past 2 weeks, how often have you been bothered by any of the following problems? Little interest or  pleasure in doing things: 0 Feeling down, depressed, or hopeless (PHQ Adolescent also includes...irritable): 0 PHQ-2 Total Score: 0 Trouble falling or staying asleep, or sleeping too much: 0 Feeling tired or having little energy: 0 Poor appetite or overeating (PHQ Adolescent also includes...weight loss): 0 Feeling bad about yourself - or that you are a failure or have let yourself or your family down: 0 Trouble concentrating on things, such as reading the newspaper or watching television (PHQ Adolescent also includes...like school work): 0 Moving or speaking so slowly that other people could have noticed. Or the opposite - being so fidgety or restless that you have been moving around a lot more than usual: 0 Thoughts that you would be better off dead, or of hurting yourself in some way: 0 PHQ-9 Total Score: 0 If you checked off any problems, how difficult have these problems made it for you to do your work, take care of things at home, or get along with other people?: Not difficult at all     Goals Addressed             This Visit's Progress    Patient Stated       Wants to exercise more             Objective:    Today's Vitals   05/14/24 1131  Weight: 171 lb (77.6 kg)  Height: 5' 6 (1.676 m)   Body mass index is 27.6 kg/m.  Hearing/Vision screen Hearing Screening - Comments:: No issues Vision Screening - Comments:: Readers, overdue, Will schedule an appointment at [facility]. Immunizations and Health Maintenance Health Maintenance  Topic Date Due   Zoster Vaccines- Shingrix (1 of 2) Never done   Pneumococcal Vaccine: 50+ Years (2 of 2 - PPSV23, PCV20, or PCV21) 07/24/2014   Bone Density Scan  04/22/2016   COVID-19 Vaccine (3 - Pfizer risk series) 08/30/2019   Lung Cancer Screening  07/14/2021   Influenza Vaccine  08/14/2024 (Originally 12/16/2023)   DTaP/Tdap/Td (2 - Td or Tdap) 06/23/2024   Mammogram  11/08/2024   Medicare Annual Wellness (AWV)  05/14/2025    Colonoscopy  05/22/2025   Hepatitis C Screening  Completed   Meningococcal B Vaccine  Aged Out        Assessment/Plan:  This is a routine wellness examination for Malynn.  Patient Care Team: Bair, Kalpana, MD as PCP - General (Family Medicine) Douglas Rule, MD as Consulting Physician (Nephrology) Lane Arthea BRAVO, MD as Referring Physician (Neurology) Lorelle Arthea, MD as Consulting Physician (Orthopedic Surgery)  I have personally reviewed and noted the following in the patients chart:   Medical and social history Use of alcohol, tobacco or illicit drugs  Current medications and supplements including opioid prescriptions. Functional ability and status Nutritional status Physical activity Advanced directives List of other physicians Hospitalizations, surgeries,  and ER visits in previous 12 months Vitals Screenings to include cognitive, depression, and falls Referrals and appointments  No orders of the defined types were placed in this encounter.  In addition, I have reviewed and discussed with patient certain preventive protocols, quality metrics, and best practice recommendations. A written personalized care plan for preventive services as well as general preventive health recommendations were provided to patient.   Angeline Fredericks, LPN   [id number]   Return in 1 year (on 05/14/2025).  After Visit Summary: (MyChart) Due to this being a telephonic visit, the after visit summary with patients personalized plan was offered to patient via MyChart   Nurse Notes: Patient plans on getting pneumonia and shingles vaccines at the pharmacy. Patient will call and schedule her Dexa scan and Lung Cancer screening  phone number provided.  "

## 2024-05-14 NOTE — Patient Instructions (Addendum)
 [name],  Thank you for taking the time for your Medicare Wellness Visit. I appreciate your continued commitment to your health goals. Please review the care plan we discussed, and feel free to reach out if I can assist you further.  Please note that Annual Wellness Visits do not include a physical exam. Some assessments may be limited, especially if the visit was conducted virtually. If needed, we may recommend an in-person follow-up with your provider.  Ongoing Care Seeing your primary care provider every 3 to 6 months helps us  monitor your health and provide consistent, personalized care.  Remember to update your pneumonia and shingles vaccines at your pharmacy.  Make sure you call and schedule your Dexa scan and Lung Cancer screening (telephone number provided). Remember to call  and schedule an eye appointment.   Referrals If a referral was made during today's visit and you haven't received any updates within two weeks, please contact the referred provider directly to check on the status.  Recommended Screenings:  Health Maintenance  Topic Date Due   Zoster (Shingles) Vaccine (1 of 2) Never done   Pneumococcal Vaccine for age over 43 (2 of 2 - PPSV23, PCV20, or PCV21) 07/24/2014   Osteoporosis screening with Bone Density Scan  04/22/2016   COVID-19 Vaccine (3 - Pfizer risk series) 08/30/2019   Screening for Lung Cancer  07/14/2021   Flu Shot  08/14/2024*   DTaP/Tdap/Td vaccine (2 - Td or Tdap) 06/23/2024   Breast Cancer Screening  11/08/2024   Medicare Annual Wellness Visit  05/14/2025   Colon Cancer Screening  05/22/2025   Hepatitis C Screening  Completed   Meningitis B Vaccine  Aged Out  *Topic was postponed. The date shown is not the original due date.       05/14/2024   11:35 AM  Advanced Directives  Does Patient Have a Medical Advance Directive? Yes  Type of Estate Agent of [place];Living will  Does patient want to make changes to medical  advance directive? No - Patient declined  Copy of Healthcare Power of Attorney in Chart? No - copy requested    Vision: Annual vision screenings are recommended for early detection of glaucoma, cataracts, and diabetic retinopathy. These exams can also reveal signs of chronic conditions such as diabetes and high blood pressure.  Dental: Annual dental screenings help detect early signs of oral cancer, gum disease, and other conditions linked to overall health, including heart disease and diabetes.  Please see the attached documents for additional preventive care recommendations.

## 2024-08-15 ENCOUNTER — Ambulatory Visit

## 2025-05-15 ENCOUNTER — Ambulatory Visit
